# Patient Record
Sex: Female | Born: 1935 | Race: White | Hispanic: No | Marital: Married | Smoking: Former smoker
Health system: Southern US, Community
[De-identification: ages and names within clinical notes are randomized; demographics above are authoritative.]

## PROBLEM LIST (undated history)

## (undated) DIAGNOSIS — J40 Bronchitis, not specified as acute or chronic: Secondary | ICD-10-CM

## (undated) DIAGNOSIS — C50919 Malignant neoplasm of unspecified site of unspecified female breast: Secondary | ICD-10-CM

## (undated) DIAGNOSIS — K219 Gastro-esophageal reflux disease without esophagitis: Secondary | ICD-10-CM

## (undated) DIAGNOSIS — G2581 Restless legs syndrome: Secondary | ICD-10-CM

## (undated) DIAGNOSIS — K5720 Diverticulitis of large intestine with perforation and abscess without bleeding: Secondary | ICD-10-CM

## (undated) DIAGNOSIS — Z951 Presence of aortocoronary bypass graft: Secondary | ICD-10-CM

## (undated) DIAGNOSIS — E119 Type 2 diabetes mellitus without complications: Secondary | ICD-10-CM

## (undated) DIAGNOSIS — I2510 Atherosclerotic heart disease of native coronary artery without angina pectoris: Secondary | ICD-10-CM

## (undated) DIAGNOSIS — N200 Calculus of kidney: Secondary | ICD-10-CM

## (undated) DIAGNOSIS — I10 Essential (primary) hypertension: Secondary | ICD-10-CM

## (undated) DIAGNOSIS — M1990 Unspecified osteoarthritis, unspecified site: Secondary | ICD-10-CM

## (undated) DIAGNOSIS — Z87442 Personal history of urinary calculi: Secondary | ICD-10-CM

## (undated) DIAGNOSIS — L409 Psoriasis, unspecified: Secondary | ICD-10-CM

## (undated) DIAGNOSIS — E785 Hyperlipidemia, unspecified: Secondary | ICD-10-CM

## (undated) DIAGNOSIS — Z923 Personal history of irradiation: Secondary | ICD-10-CM

## (undated) DIAGNOSIS — E039 Hypothyroidism, unspecified: Secondary | ICD-10-CM

## (undated) DIAGNOSIS — C50912 Malignant neoplasm of unspecified site of left female breast: Secondary | ICD-10-CM

## (undated) DIAGNOSIS — I6529 Occlusion and stenosis of unspecified carotid artery: Secondary | ICD-10-CM

## (undated) HISTORY — DX: Diverticulitis of large intestine with perforation and abscess without bleeding: K57.20

## (undated) HISTORY — DX: Calculus of kidney: N20.0

## (undated) HISTORY — PX: COLON SURGERY: SHX602

## (undated) HISTORY — DX: Atherosclerotic heart disease of native coronary artery without angina pectoris: I25.10

## (undated) HISTORY — DX: Occlusion and stenosis of unspecified carotid artery: I65.29

## (undated) HISTORY — PX: APPENDECTOMY: SHX54

## (undated) HISTORY — PX: OTHER SURGICAL HISTORY: SHX169

## (undated) HISTORY — DX: Restless legs syndrome: G25.81

## (undated) HISTORY — PX: ABDOMINAL HYSTERECTOMY: SHX81

## (undated) HISTORY — PX: COLONOSCOPY: SHX174

## (undated) HISTORY — DX: Presence of aortocoronary bypass graft: Z95.1

## (undated) HISTORY — DX: Hyperlipidemia, unspecified: E78.5

## (undated) HISTORY — DX: Essential (primary) hypertension: I10

## (undated) HISTORY — DX: Type 2 diabetes mellitus without complications: E11.9

## (undated) HISTORY — DX: Hypothyroidism, unspecified: E03.9

## (undated) HISTORY — DX: Malignant neoplasm of unspecified site of left female breast: C50.912

---

## 1975-09-06 HISTORY — PX: COLON RESECTION: SHX5231

## 1997-09-05 HISTORY — PX: VEIN BYPASS SURGERY: SHX833

## 2000-12-26 ENCOUNTER — Other Ambulatory Visit: Admission: RE | Admit: 2000-12-26 | Discharge: 2000-12-26 | Payer: Self-pay | Admitting: Obstetrics & Gynecology

## 2003-10-01 ENCOUNTER — Other Ambulatory Visit: Admission: RE | Admit: 2003-10-01 | Discharge: 2003-10-01 | Payer: Self-pay | Admitting: Obstetrics & Gynecology

## 2005-12-29 ENCOUNTER — Ambulatory Visit: Payer: Self-pay | Admitting: Cardiology

## 2007-04-23 ENCOUNTER — Ambulatory Visit: Payer: Self-pay | Admitting: Cardiology

## 2007-06-07 ENCOUNTER — Ambulatory Visit: Payer: Self-pay | Admitting: Internal Medicine

## 2007-06-21 ENCOUNTER — Ambulatory Visit: Payer: Self-pay | Admitting: Internal Medicine

## 2007-08-31 ENCOUNTER — Encounter: Admission: RE | Admit: 2007-08-31 | Discharge: 2007-08-31 | Payer: Self-pay | Admitting: Endocrinology

## 2007-09-11 ENCOUNTER — Encounter: Admission: RE | Admit: 2007-09-11 | Discharge: 2007-09-11 | Payer: Self-pay | Admitting: Endocrinology

## 2008-09-12 ENCOUNTER — Encounter: Admission: RE | Admit: 2008-09-12 | Discharge: 2008-09-12 | Payer: Self-pay | Admitting: Endocrinology

## 2009-04-07 ENCOUNTER — Encounter: Payer: Self-pay | Admitting: Cardiology

## 2009-06-04 ENCOUNTER — Ambulatory Visit: Payer: Self-pay | Admitting: Cardiology

## 2009-06-04 DIAGNOSIS — I10 Essential (primary) hypertension: Secondary | ICD-10-CM | POA: Insufficient documentation

## 2009-06-04 DIAGNOSIS — I447 Left bundle-branch block, unspecified: Secondary | ICD-10-CM

## 2009-06-19 ENCOUNTER — Ambulatory Visit: Payer: Self-pay | Admitting: Cardiology

## 2009-06-19 ENCOUNTER — Ambulatory Visit (HOSPITAL_COMMUNITY): Admission: RE | Admit: 2009-06-19 | Discharge: 2009-06-19 | Payer: Self-pay | Admitting: Cardiology

## 2009-06-19 ENCOUNTER — Ambulatory Visit: Payer: Self-pay

## 2009-06-19 ENCOUNTER — Encounter: Payer: Self-pay | Admitting: Cardiology

## 2009-06-26 ENCOUNTER — Telehealth: Payer: Self-pay | Admitting: Cardiology

## 2009-09-20 ENCOUNTER — Emergency Department (HOSPITAL_COMMUNITY): Admission: EM | Admit: 2009-09-20 | Discharge: 2009-09-20 | Payer: Self-pay | Admitting: Emergency Medicine

## 2009-10-01 ENCOUNTER — Encounter: Admission: RE | Admit: 2009-10-01 | Discharge: 2009-10-01 | Payer: Self-pay | Admitting: Endocrinology

## 2009-12-11 ENCOUNTER — Emergency Department (HOSPITAL_COMMUNITY): Admission: EM | Admit: 2009-12-11 | Discharge: 2009-12-12 | Payer: Self-pay | Admitting: Emergency Medicine

## 2010-05-17 ENCOUNTER — Encounter: Admission: RE | Admit: 2010-05-17 | Discharge: 2010-05-17 | Payer: Self-pay | Admitting: Orthopedic Surgery

## 2010-05-19 ENCOUNTER — Ambulatory Visit (HOSPITAL_BASED_OUTPATIENT_CLINIC_OR_DEPARTMENT_OTHER): Admission: RE | Admit: 2010-05-19 | Discharge: 2010-05-19 | Payer: Self-pay | Admitting: Orthopedic Surgery

## 2010-09-05 HISTORY — PX: CARDIAC CATHETERIZATION: SHX172

## 2010-10-04 ENCOUNTER — Encounter
Admission: RE | Admit: 2010-10-04 | Discharge: 2010-10-04 | Payer: Self-pay | Source: Home / Self Care | Attending: Endocrinology | Admitting: Endocrinology

## 2010-10-06 ENCOUNTER — Other Ambulatory Visit: Payer: Self-pay | Admitting: Endocrinology

## 2010-10-06 DIAGNOSIS — R921 Mammographic calcification found on diagnostic imaging of breast: Secondary | ICD-10-CM

## 2010-10-08 ENCOUNTER — Ambulatory Visit
Admission: RE | Admit: 2010-10-08 | Discharge: 2010-10-08 | Disposition: A | Discharge status: Home / Self Care | Payer: Medicare Other | Source: Ambulatory Visit | Attending: Endocrinology | Admitting: Endocrinology

## 2010-10-08 DIAGNOSIS — R921 Mammographic calcification found on diagnostic imaging of breast: Secondary | ICD-10-CM

## 2010-10-25 ENCOUNTER — Telehealth (INDEPENDENT_AMBULATORY_CARE_PROVIDER_SITE_OTHER): Payer: Self-pay | Admitting: *Deleted

## 2010-11-02 NOTE — Progress Notes (Signed)
  Request received from Exam One sent to [facility]  October 25, 2010 11:59 AM

## 2010-11-18 LAB — POCT I-STAT 4, (NA,K, GLUC, HGB,HCT)
Glucose, Bld: 106 mg/dL — ABNORMAL HIGH (ref 70–99)
Hemoglobin: 14.3 g/dL (ref 12.0–15.0)
Potassium: 3.4 mEq/L — ABNORMAL LOW (ref 3.5–5.1)
Sodium: 141 mEq/L (ref 135–145)

## 2010-11-24 LAB — COMPREHENSIVE METABOLIC PANEL
ALT: 52 U/L — ABNORMAL HIGH (ref 0–35)
Alkaline Phosphatase: 96 U/L (ref 39–117)
CO2: 27 mEq/L (ref 19–32)
GFR calc Af Amer: >60 mL/min (ref >60)
Glucose, Bld: 100 mg/dL — ABNORMAL HIGH (ref 70–99)
Total Protein: 7.4 g/dL (ref 6.0–8.3)

## 2010-11-24 LAB — DIFFERENTIAL
Basophils Absolute: 0 10*3/uL (ref 0.0–0.1)
Eosinophils Relative: 1 % (ref 0–5)
Lymphocytes Relative: 30 % (ref 12–46)
Monocytes Absolute: 0.6 10*3/uL (ref 0.1–1.0)
Monocytes Relative: 7 % (ref 3–12)

## 2010-11-24 LAB — CBC
HCT: 40.3 % (ref 36.0–46.0)
Hemoglobin: 13.3 g/dL (ref 12.0–15.0)
MCHC: 33 g/dL (ref 30.0–36.0)
MCV: 89.3 fL (ref 78.0–100.0)
RDW: 14.5 % (ref 11.5–15.5)

## 2010-11-24 LAB — LIPASE, BLOOD: Lipase: 21 U/L (ref 11–59)

## 2011-01-18 NOTE — Letter (Signed)
April 23, 2007    Veverly Fells. Altheimer, M.D.  [address]  [place], [place] [address]   RE:  Katherine, [name]  MRN:  [id number]  /  DOB:  04/23/1933   Dear [name]:   I had the pleasure of seeing [name] in the office today in  followup.  Cardiacwise she is doing fine.  She has not had any  significant symptoms.  Her biggest job probably is taking care of her  husband.  As you know, she previously had left bundle branch block.  She  has been on drug therapy for hypertension, and done reasonably well with  that.  Her last echocardiogram done in 2004 was consistent with a  conduction abnormality and perhaps mild diastolic abnormality from  relaxation, largely perhaps related to her hypertension.   CURRENT MEDICATIONS:  1. Synthroid 0.125 mg daily.  2. Hyzaar 50/12.5 daily.  3. Nexium 40 mg daily.  4. Multivitamin daily.  5. ReQuip 1 mg daily.  6. Lipitor 40 mg daily.  7. Zetia 10 mg daily.  8. Premarin 0.3 mg daily.  9. Aspirin 81 mg daily.   PHYSICAL:  She is well-appearing.  The blood pressure is 118/70, the  pulse is 74, the weight is 179 pounds.  There is some paradoxical  splitting of the second heart sound, no murmurs at the present are  noted, there is no definite extremity edema.   The electrocardiogram demonstrates normal sinus rhythm and left bundle  branch block.   To summarize, this patient has an intraventricular conduction delay,  probably mild diastolic dysfunction in part related to hypertension, and  hypertension which seems to be under better control.  I did go through  the whole issue with regard to the Vytorin and Zetia issue and recounted  that to she and her husband in detail today.   We plan to see her back in followup in about 2 years.  At that time, it  probably would be worthwhile to recheck her echocardiogram to reassess  her left ventricular function.  Otherwise, she will continue to do well  and I have reinforced the need for  them to follow a diet.  I appreciate  the opportunity of sharing in her care.    Sincerely,      Arturo Morton. Riley Kill, MD, [facility]  Electronically Signed    TDS/MedQ  DD: 04/23/2007  DT: 04/24/2007  Job #: [id number]

## 2011-03-18 ENCOUNTER — Other Ambulatory Visit: Payer: Self-pay | Admitting: Endocrinology

## 2011-03-18 DIAGNOSIS — R921 Mammographic calcification found on diagnostic imaging of breast: Secondary | ICD-10-CM

## 2011-03-25 ENCOUNTER — Ambulatory Visit
Admission: RE | Admit: 2011-03-25 | Discharge: 2011-03-25 | Disposition: A | Discharge status: Home / Self Care | Payer: Medicare Other | Source: Ambulatory Visit | Attending: Endocrinology | Admitting: Endocrinology

## 2011-03-25 DIAGNOSIS — R921 Mammographic calcification found on diagnostic imaging of breast: Secondary | ICD-10-CM

## 2011-06-06 DIAGNOSIS — Z951 Presence of aortocoronary bypass graft: Secondary | ICD-10-CM

## 2011-06-06 HISTORY — DX: Presence of aortocoronary bypass graft: Z95.1

## 2011-06-06 HISTORY — PX: CORONARY ARTERY BYPASS GRAFT: SHX141

## 2011-06-12 ENCOUNTER — Emergency Department (HOSPITAL_COMMUNITY): Payer: Medicare Other

## 2011-06-12 ENCOUNTER — Inpatient Hospital Stay (HOSPITAL_COMMUNITY)
Admission: EM | Admit: 2011-06-12 | Discharge: 2011-06-21 | DRG: 234 | Disposition: A | Discharge status: Home / Self Care | Payer: Medicare Other | Attending: Cardiothoracic Surgery | Admitting: Cardiothoracic Surgery

## 2011-06-12 DIAGNOSIS — E876 Hypokalemia: Secondary | ICD-10-CM | POA: Diagnosis present

## 2011-06-12 DIAGNOSIS — E039 Hypothyroidism, unspecified: Secondary | ICD-10-CM | POA: Diagnosis present

## 2011-06-12 DIAGNOSIS — D62 Acute posthemorrhagic anemia: Secondary | ICD-10-CM | POA: Diagnosis not present

## 2011-06-12 DIAGNOSIS — I447 Left bundle-branch block, unspecified: Secondary | ICD-10-CM | POA: Diagnosis present

## 2011-06-12 DIAGNOSIS — I498 Other specified cardiac arrhythmias: Secondary | ICD-10-CM | POA: Diagnosis present

## 2011-06-12 DIAGNOSIS — Z7982 Long term (current) use of aspirin: Secondary | ICD-10-CM

## 2011-06-12 DIAGNOSIS — E119 Type 2 diabetes mellitus without complications: Secondary | ICD-10-CM | POA: Diagnosis present

## 2011-06-12 DIAGNOSIS — R0789 Other chest pain: Secondary | ICD-10-CM

## 2011-06-12 DIAGNOSIS — E785 Hyperlipidemia, unspecified: Secondary | ICD-10-CM | POA: Diagnosis present

## 2011-06-12 DIAGNOSIS — I119 Hypertensive heart disease without heart failure: Secondary | ICD-10-CM | POA: Diagnosis present

## 2011-06-12 DIAGNOSIS — Z87891 Personal history of nicotine dependence: Secondary | ICD-10-CM

## 2011-06-12 DIAGNOSIS — I2510 Atherosclerotic heart disease of native coronary artery without angina pectoris: Principal | ICD-10-CM | POA: Diagnosis present

## 2011-06-12 DIAGNOSIS — G2581 Restless legs syndrome: Secondary | ICD-10-CM | POA: Diagnosis present

## 2011-06-12 DIAGNOSIS — E669 Obesity, unspecified: Secondary | ICD-10-CM | POA: Diagnosis present

## 2011-06-12 DIAGNOSIS — I200 Unstable angina: Secondary | ICD-10-CM | POA: Diagnosis present

## 2011-06-12 LAB — DIFFERENTIAL
Basophils Absolute: 0 10*3/uL (ref 0.0–0.1)
Lymphocytes Relative: 39 % (ref 12–46)
Lymphs Abs: 3.5 10*3/uL (ref 0.7–4.0)
Monocytes Absolute: 0.7 10*3/uL (ref 0.1–1.0)
Monocytes Relative: 7 % (ref 3–12)
Neutro Abs: 4.6 10*3/uL (ref 1.7–7.7)

## 2011-06-12 LAB — COMPREHENSIVE METABOLIC PANEL
BUN: 19 mg/dL (ref 6–23)
CO2: 25 mEq/L (ref 19–32)
Calcium: 9.8 mg/dL (ref 8.4–10.5)
Creatinine, Ser: 0.65 mg/dL (ref 0.50–1.10)
GFR calc Af Amer: >90 mL/min (ref >90)
GFR calc non Af Amer: 84 mL/min — ABNORMAL LOW (ref >90)
Glucose, Bld: 140 mg/dL — ABNORMAL HIGH (ref 70–99)
Total Protein: 7.7 g/dL (ref 6.0–8.3)

## 2011-06-12 LAB — CK TOTAL AND CKMB (NOT AT ARMC)
CK, MB: 3.3 ng/mL (ref 0.3–4.0)
Total CK: 124 U/L (ref 7–177)

## 2011-06-12 LAB — POCT I-STAT TROPONIN I: Troponin i, poc: 0 ng/mL (ref 0.00–0.08)

## 2011-06-12 LAB — CBC
MCH: 29.4 pg (ref 26.0–34.0)
MCHC: 34 g/dL (ref 30.0–36.0)
MCV: 86.6 fL (ref 78.0–100.0)
Platelets: 266 10*3/uL (ref 150–400)
RDW: 14.1 % (ref 11.5–15.5)

## 2011-06-13 DIAGNOSIS — I2510 Atherosclerotic heart disease of native coronary artery without angina pectoris: Secondary | ICD-10-CM

## 2011-06-13 LAB — BASIC METABOLIC PANEL
BUN: 15 mg/dL (ref 6–23)
Creatinine, Ser: 0.53 mg/dL (ref 0.50–1.10)
GFR calc Af Amer: >90 mL/min (ref >90)
GFR calc non Af Amer: 90 mL/min — ABNORMAL LOW (ref >90)
Glucose, Bld: 103 mg/dL — ABNORMAL HIGH (ref 70–99)

## 2011-06-13 LAB — GLUCOSE, CAPILLARY
Glucose-Capillary: 118 mg/dL — ABNORMAL HIGH (ref 70–99)
Glucose-Capillary: 110 mg/dL — ABNORMAL HIGH (ref 70–99)
Glucose-Capillary: 143 mg/dL — ABNORMAL HIGH (ref 70–99)

## 2011-06-13 LAB — CARDIAC PANEL(CRET KIN+CKTOT+MB+TROPI)
Relative Index: 2.3 (ref 0.0–2.5)
Relative Index: 2.3 (ref 0.0–2.5)
Relative Index: 2 (ref 0.0–2.5)
Total CK: 121 U/L (ref 7–177)
Total CK: 164 U/L (ref 7–177)
Troponin I: <0.3 ng/mL (ref <0.30)
Troponin I: <0.3 ng/mL (ref <0.30)

## 2011-06-13 LAB — TSH: TSH: 1.966 u[IU]/mL (ref 0.350–4.500)

## 2011-06-13 LAB — D-DIMER, QUANTITATIVE
D-Dimer, Quant: 0.23 ug/mL-FEU (ref 0.00–0.48)
D-Dimer, Quant: 0.28 ug/mL-FEU (ref 0.00–0.48)

## 2011-06-13 LAB — HEMOGLOBIN A1C: Mean Plasma Glucose: 134 mg/dL — ABNORMAL HIGH (ref <117)

## 2011-06-14 ENCOUNTER — Inpatient Hospital Stay (HOSPITAL_COMMUNITY): Payer: Medicare Other

## 2011-06-14 DIAGNOSIS — I379 Nonrheumatic pulmonary valve disorder, unspecified: Secondary | ICD-10-CM

## 2011-06-14 DIAGNOSIS — Z01810 Encounter for preprocedural cardiovascular examination: Secondary | ICD-10-CM

## 2011-06-14 DIAGNOSIS — I2510 Atherosclerotic heart disease of native coronary artery without angina pectoris: Secondary | ICD-10-CM

## 2011-06-14 LAB — TYPE AND SCREEN
ABO/RH(D): A POS
Antibody Screen: NEGATIVE

## 2011-06-14 LAB — BASIC METABOLIC PANEL
BUN: 14 mg/dL (ref 6–23)
Calcium: 9.2 mg/dL (ref 8.4–10.5)
GFR calc Af Amer: >90 mL/min (ref >90)
GFR calc non Af Amer: >90 mL/min (ref >90)
Glucose, Bld: 100 mg/dL — ABNORMAL HIGH (ref 70–99)
Potassium: 3.4 mEq/L — ABNORMAL LOW (ref 3.5–5.1)

## 2011-06-15 ENCOUNTER — Inpatient Hospital Stay (HOSPITAL_COMMUNITY): Payer: Medicare Other

## 2011-06-15 DIAGNOSIS — I2510 Atherosclerotic heart disease of native coronary artery without angina pectoris: Secondary | ICD-10-CM

## 2011-06-15 LAB — CBC
HCT: 39.7 % (ref 36.0–46.0)
HCT: 30.7 % — ABNORMAL LOW (ref 36.0–46.0)
HCT: 22.5 % — ABNORMAL LOW (ref 36.0–46.0)
HCT: 33.2 % — ABNORMAL LOW (ref 36.0–46.0)
Hemoglobin: 13.3 g/dL (ref 12.0–15.0)
Hemoglobin: 10.3 g/dL — ABNORMAL LOW (ref 12.0–15.0)
Hemoglobin: 7.6 g/dL — ABNORMAL LOW (ref 12.0–15.0)
Hemoglobin: 11.1 g/dL — ABNORMAL LOW (ref 12.0–15.0)
MCH: 28.6 pg (ref 26.0–34.0)
MCH: 28.9 pg (ref 26.0–34.0)
MCH: 28.6 pg (ref 26.0–34.0)
MCHC: 33.6 g/dL (ref 30.0–36.0)
MCHC: 33.8 g/dL (ref 30.0–36.0)
MCHC: 33.4 g/dL (ref 30.0–36.0)
MCV: 85.3 fL (ref 78.0–100.0)
MCV: 85.6 fL (ref 78.0–100.0)
MCV: 85.6 fL (ref 78.0–100.0)
Platelets: 169 10*3/uL (ref 150–400)
Platelets: 136 10*3/uL — ABNORMAL LOW (ref 150–400)
Platelets: 207 10*3/uL (ref 150–400)
RBC: 4.61 MIL/uL (ref 3.87–5.11)
RBC: 3.6 MIL/uL — ABNORMAL LOW (ref 3.87–5.11)
RBC: 2.63 MIL/uL — ABNORMAL LOW (ref 3.87–5.11)
RBC: 3.88 MIL/uL (ref 3.87–5.11)
RDW: 13.9 % (ref 11.5–15.5)
RDW: 13.7 % (ref 11.5–15.5)
RDW: 13.9 % (ref 11.5–15.5)
WBC: 9.5 10*3/uL (ref 4.0–10.5)
WBC: 10.2 10*3/uL (ref 4.0–10.5)
WBC: 7.6 10*3/uL (ref 4.0–10.5)
WBC: 11.1 10*3/uL — ABNORMAL HIGH (ref 4.0–10.5)

## 2011-06-15 LAB — GLUCOSE, CAPILLARY

## 2011-06-15 LAB — POCT I-STAT 4, (NA,K, GLUC, HGB,HCT)
Glucose, Bld: 104 mg/dL — ABNORMAL HIGH (ref 70–99)
Glucose, Bld: 122 mg/dL — ABNORMAL HIGH (ref 70–99)
Glucose, Bld: 116 mg/dL — ABNORMAL HIGH (ref 70–99)
Glucose, Bld: 118 mg/dL — ABNORMAL HIGH (ref 70–99)
Glucose, Bld: 104 mg/dL — ABNORMAL HIGH (ref 70–99)
HCT: 32 % — ABNORMAL LOW (ref 36.0–46.0)
HCT: 31 % — ABNORMAL LOW (ref 36.0–46.0)
HCT: 31 % — ABNORMAL LOW (ref 36.0–46.0)
HCT: 31 % — ABNORMAL LOW (ref 36.0–46.0)
HCT: 34 % — ABNORMAL LOW (ref 36.0–46.0)
Hemoglobin: 10.9 g/dL — ABNORMAL LOW (ref 12.0–15.0)
Hemoglobin: 10.5 g/dL — ABNORMAL LOW (ref 12.0–15.0)
Hemoglobin: 10.5 g/dL — ABNORMAL LOW (ref 12.0–15.0)
Hemoglobin: 10.5 g/dL — ABNORMAL LOW (ref 12.0–15.0)
Hemoglobin: 11.6 g/dL — ABNORMAL LOW (ref 12.0–15.0)
Potassium: 3.9 meq/L (ref 3.5–5.1)
Potassium: 3.8 meq/L (ref 3.5–5.1)
Potassium: 3.5 meq/L (ref 3.5–5.1)
Potassium: 3.6 meq/L (ref 3.5–5.1)
Potassium: 3.5 mEq/L (ref 3.5–5.1)
Sodium: 139 meq/L (ref 135–145)
Sodium: 139 meq/L (ref 135–145)
Sodium: 139 meq/L (ref 135–145)
Sodium: 140 meq/L (ref 135–145)
Sodium: 139 mEq/L (ref 135–145)

## 2011-06-15 LAB — BLOOD GAS, ARTERIAL
Acid-Base Excess: 0 mmol/L (ref 0.0–2.0)
Bicarbonate: 23.9 meq/L (ref 20.0–24.0)
Drawn by: 3
O2 Content: 0.2 L/min
O2 Saturation: 96.6 %
Patient temperature: 98.6
Sample type: 347621
TCO2: 25.1 mmol/L (ref 0–100)
pCO2 arterial: 37.6 mmHg (ref 35.0–45.0)
pH, Arterial: 7.42 — ABNORMAL HIGH (ref 7.350–7.400)
pO2, Arterial: 79.7 mmHg — ABNORMAL LOW (ref 80.0–100.0)

## 2011-06-15 LAB — POCT I-STAT, CHEM 8
BUN: 8 mg/dL (ref 6–23)
Calcium, Ion: 1.23 mmol/L (ref 1.12–1.32)
Creatinine, Ser: 0.2 mg/dL — ABNORMAL LOW (ref 0.50–1.10)
Creatinine, Ser: 0.6 mg/dL (ref 0.50–1.10)
Glucose, Bld: 73 mg/dL (ref 70–99)
Glucose, Bld: 104 mg/dL — ABNORMAL HIGH (ref 70–99)
Hemoglobin: 11.2 g/dL — ABNORMAL LOW (ref 12.0–15.0)
Potassium: 2.5 mEq/L — CL (ref 3.5–5.1)
Sodium: 146 mEq/L — ABNORMAL HIGH (ref 135–145)
Sodium: 138 mEq/L (ref 135–145)
TCO2: 13 mmol/L (ref 0–100)
TCO2: 22 mmol/L (ref 0–100)

## 2011-06-15 LAB — CREATININE, SERUM
Creatinine, Ser: <0.47 mg/dL — ABNORMAL LOW (ref 0.50–1.10)
Creatinine, Ser: 0.51 mg/dL (ref 0.50–1.10)
GFR calc Af Amer: >90 mL/min (ref >90)
GFR calc non Af Amer: >90 mL/min (ref >90)

## 2011-06-15 LAB — POCT I-STAT 3, ART BLOOD GAS (G3+)
Acid-base deficit: 7 mmol/L — ABNORMAL HIGH (ref 0.0–2.0)
Bicarbonate: 23.3 mEq/L (ref 20.0–24.0)
Bicarbonate: 18.3 mEq/L — ABNORMAL LOW (ref 20.0–24.0)
O2 Saturation: 97 %
Patient temperature: 37.8
TCO2: 25 mmol/L (ref 0–100)
pCO2 arterial: 38.2 mmHg (ref 35.0–45.0)
pH, Arterial: 7.382 (ref 7.350–7.400)
pH, Arterial: 7.338 — ABNORMAL LOW (ref 7.350–7.400)
pO2, Arterial: 80 mmHg (ref 80.0–100.0)

## 2011-06-15 LAB — POTASSIUM
Potassium: 2.8 mEq/L — ABNORMAL LOW (ref 3.5–5.1)
Potassium: 4.3 mEq/L (ref 3.5–5.1)

## 2011-06-15 LAB — SURGICAL PCR SCREEN
MRSA, PCR: NEGATIVE
Staphylococcus aureus: POSITIVE — AB

## 2011-06-15 LAB — BASIC METABOLIC PANEL WITH GFR
BUN: 19 mg/dL (ref 6–23)
CO2: 23 meq/L (ref 19–32)
Calcium: 9.9 mg/dL (ref 8.4–10.5)
Chloride: 105 meq/L (ref 96–112)
Creatinine, Ser: 0.63 mg/dL (ref 0.50–1.10)
GFR calc Af Amer: >90 mL/min
GFR calc non Af Amer: 85 mL/min — ABNORMAL LOW
Glucose, Bld: 113 mg/dL — ABNORMAL HIGH (ref 70–99)
Potassium: 3.9 meq/L (ref 3.5–5.1)
Sodium: 140 meq/L (ref 135–145)

## 2011-06-15 LAB — APTT: aPTT: 40 seconds — ABNORMAL HIGH (ref 24–37)

## 2011-06-15 LAB — POCT I-STAT GLUCOSE
Glucose, Bld: 104 mg/dL — ABNORMAL HIGH (ref 70–99)
Operator id: 195151

## 2011-06-15 LAB — URINE MICROSCOPIC-ADD ON

## 2011-06-15 LAB — URINALYSIS, ROUTINE W REFLEX MICROSCOPIC
Glucose, UA: NEGATIVE mg/dL
Nitrite: NEGATIVE
pH: 5 (ref 5.0–8.0)

## 2011-06-15 LAB — MAGNESIUM
Magnesium: 1.9 mg/dL (ref 1.5–2.5)
Magnesium: 2.5 mg/dL (ref 1.5–2.5)

## 2011-06-15 LAB — PROTIME-INR
INR: 1.52 — ABNORMAL HIGH (ref 0.00–1.49)
Prothrombin Time: 18.6 seconds — ABNORMAL HIGH (ref 11.6–15.2)

## 2011-06-16 ENCOUNTER — Inpatient Hospital Stay (HOSPITAL_COMMUNITY): Payer: Medicare Other

## 2011-06-16 DIAGNOSIS — E1165 Type 2 diabetes mellitus with hyperglycemia: Secondary | ICD-10-CM

## 2011-06-16 DIAGNOSIS — IMO0001 Reserved for inherently not codable concepts without codable children: Secondary | ICD-10-CM

## 2011-06-16 HISTORY — PX: OTHER SURGICAL HISTORY: SHX169

## 2011-06-16 LAB — CREATININE, SERUM
Creatinine, Ser: 0.56 mg/dL (ref 0.50–1.10)
GFR calc Af Amer: >90 mL/min (ref >90)
GFR calc non Af Amer: 88 mL/min — ABNORMAL LOW (ref >90)

## 2011-06-16 LAB — BASIC METABOLIC PANEL
BUN: 14 mg/dL (ref 6–23)
CO2: 21 mEq/L (ref 19–32)
Calcium: 8.6 mg/dL (ref 8.4–10.5)
Chloride: 104 mEq/L (ref 96–112)
Creatinine, Ser: 0.57 mg/dL (ref 0.50–1.10)
GFR calc Af Amer: >90 mL/min (ref >90)
GFR calc non Af Amer: 88 mL/min — ABNORMAL LOW (ref >90)
Glucose, Bld: 119 mg/dL — ABNORMAL HIGH (ref 70–99)
Potassium: 4.1 mEq/L (ref 3.5–5.1)
Sodium: 134 mEq/L — ABNORMAL LOW (ref 135–145)

## 2011-06-16 LAB — MAGNESIUM
Magnesium: 2.1 mg/dL (ref 1.5–2.5)
Magnesium: 2 mg/dL (ref 1.5–2.5)

## 2011-06-16 LAB — CBC
HCT: 32.5 % — ABNORMAL LOW (ref 36.0–46.0)
HCT: 32.2 % — ABNORMAL LOW (ref 36.0–46.0)
Hemoglobin: 10.9 g/dL — ABNORMAL LOW (ref 12.0–15.0)
Hemoglobin: 10.7 g/dL — ABNORMAL LOW (ref 12.0–15.0)
MCH: 28.5 pg (ref 26.0–34.0)
MCH: 28.6 pg (ref 26.0–34.0)
MCHC: 33.5 g/dL (ref 30.0–36.0)
MCHC: 33.2 g/dL (ref 30.0–36.0)
MCV: 85.1 fL (ref 78.0–100.0)
MCV: 86.1 fL (ref 78.0–100.0)
Platelets: 219 10*3/uL (ref 150–400)
Platelets: 228 10*3/uL (ref 150–400)
RBC: 3.82 MIL/uL — ABNORMAL LOW (ref 3.87–5.11)
RBC: 3.74 MIL/uL — ABNORMAL LOW (ref 3.87–5.11)
RDW: 13.9 % (ref 11.5–15.5)
RDW: 14.3 % (ref 11.5–15.5)
WBC: 11.7 10*3/uL — ABNORMAL HIGH (ref 4.0–10.5)
WBC: 11.9 10*3/uL — ABNORMAL HIGH (ref 4.0–10.5)

## 2011-06-16 LAB — GLUCOSE, CAPILLARY
Glucose-Capillary: 104 mg/dL — ABNORMAL HIGH (ref 70–99)
Glucose-Capillary: 107 mg/dL — ABNORMAL HIGH (ref 70–99)
Glucose-Capillary: 108 mg/dL — ABNORMAL HIGH (ref 70–99)
Glucose-Capillary: 109 mg/dL — ABNORMAL HIGH (ref 70–99)
Glucose-Capillary: 111 mg/dL — ABNORMAL HIGH (ref 70–99)
Glucose-Capillary: 111 mg/dL — ABNORMAL HIGH (ref 70–99)
Glucose-Capillary: 146 mg/dL — ABNORMAL HIGH (ref 70–99)

## 2011-06-16 NOTE — Cardiovascular Report (Signed)
  [name], KEEL NO.:  [id number]  MEDICAL RECORD NO.:  [id number]  LOCATION:  4738                         FACILITY:  [facility]  PHYSICIAN:  Arturo Morton. Riley Kill, MD, FACCDATE OF BIRTH:  [birth year]  DATE OF PROCEDURE:  06/13/2011 DATE OF DISCHARGE:                           CARDIAC CATHETERIZATION   INDICATIONS:  This nice lady presented with chest pain.  She has multiple risk factors for coronary artery disease.  She has a chronic left bundle we have seen her previously.  She presented with chest pain. Her enzymes were negative.  Cardiac catheterization was recommended.  PROCEDURES: 1. Left heart catheterization. 2. Selective coronary arteriography. 3. Selective left ventriculography.  DESCRIPTION OF PROCEDURE:  The procedure was performed from the right femoral artery using 5-French catheters.  She tolerated well without complication.  She was given labetalol for hypertension.  There were no major complications and I have reviewed the findings with the patient and subsequently her family.  HEMODYNAMIC DATA: 1. Central aortic pressure 157/64, mean 101. 2. LV pressure 181/22. 3. No gradient or pullback across the aortic valve.  ANGIOGRAPHIC DATA: 1. On plain fluoroscopy, there was moderate calcification of all 3     coronary vessels. 2. The left main is free of critical disease. 3. The LAD has a bifurcation stenosis.  There is about 80% involvement     of the LAD and then 80% involvement of diagonal.  There is 80%     lesion in the LAD just beyond the diagonal.  The distal LAD is     suitable for grafting. 4. The circumflex provides a first marginal that has probably 80%     ostial and then a 70% mid lesion.  The remaining portion of the     circumflex is fairly large and is without critical narrowing other     than minor luminal irregularity. 5. The right coronary artery has clear-cut 70-80% ostial stenosis.     There is also a 60-70% mid vessel  stenosis.  The PDA and PLA are     suitable for grafting. 6. Ventriculography in the RAO projection reveals well-preserved left     ventricular function with a normal ejection fraction 55% by     estimate.  I did not appreciate significant regurgitation.  CONCLUSIONS:  Three-vessel coronary artery disease involving the ostium of the circumflex marginal, the bifurcation in midportion of the left anterior descending artery, and the ostium of the right coronary artery with mid right RCA disease.  DISPOSITION:  None of these are ideal for percutaneous intervention. She is asymptomatic.  She does have three-vessel disease.  I will ask the surgeons to see her with regard to consultation.     Arturo Morton. Riley Kill, MD, [facility]     TDS/MEDQ  D:  06/13/2011  T:  06/14/2011  Job:  [id number]  cc:   CV Laboratory Veverly Fells. Altheimer, M.D.  Electronically Signed by Shawnie Pons MD [facility] on 06/16/2011 05:47:40 AM

## 2011-06-17 ENCOUNTER — Inpatient Hospital Stay (HOSPITAL_COMMUNITY): Payer: Medicare Other

## 2011-06-17 LAB — BASIC METABOLIC PANEL
BUN: 11 mg/dL (ref 6–23)
CO2: 25 mEq/L (ref 19–32)
Calcium: 9.3 mg/dL (ref 8.4–10.5)
Chloride: 101 mEq/L (ref 96–112)
Creatinine, Ser: 0.52 mg/dL (ref 0.50–1.10)
GFR calc Af Amer: >90 mL/min (ref >90)
GFR calc non Af Amer: >90 mL/min (ref >90)
Glucose, Bld: 141 mg/dL — ABNORMAL HIGH (ref 70–99)
Potassium: 4.5 mEq/L (ref 3.5–5.1)
Sodium: 134 mEq/L — ABNORMAL LOW (ref 135–145)

## 2011-06-17 LAB — CBC
HCT: 35 % — ABNORMAL LOW (ref 36.0–46.0)
Hemoglobin: 11.6 g/dL — ABNORMAL LOW (ref 12.0–15.0)
MCH: 28.7 pg (ref 26.0–34.0)
MCHC: 33.1 g/dL (ref 30.0–36.0)
MCV: 86.6 fL (ref 78.0–100.0)
Platelets: 239 10*3/uL (ref 150–400)
RBC: 4.04 MIL/uL (ref 3.87–5.11)
RDW: 14.1 % (ref 11.5–15.5)
WBC: 13.8 10*3/uL — ABNORMAL HIGH (ref 4.0–10.5)

## 2011-06-17 LAB — POCT I-STAT, CHEM 8
BUN: 9 mg/dL (ref 6–23)
Chloride: 102 mEq/L (ref 96–112)
Creatinine, Ser: 0.7 mg/dL (ref 0.50–1.10)
Sodium: 135 mEq/L (ref 135–145)

## 2011-06-17 LAB — GLUCOSE, CAPILLARY
Glucose-Capillary: 143 mg/dL — ABNORMAL HIGH (ref 70–99)
Glucose-Capillary: 125 mg/dL — ABNORMAL HIGH (ref 70–99)

## 2011-06-18 ENCOUNTER — Inpatient Hospital Stay (HOSPITAL_COMMUNITY): Payer: Medicare Other

## 2011-06-18 LAB — BASIC METABOLIC PANEL
BUN: 17 mg/dL (ref 6–23)
CO2: 25 mEq/L (ref 19–32)
Calcium: 9.2 mg/dL (ref 8.4–10.5)
Chloride: 102 mEq/L (ref 96–112)
Creatinine, Ser: 0.47 mg/dL — ABNORMAL LOW (ref 0.50–1.10)
GFR calc Af Amer: >90 mL/min (ref >90)
GFR calc non Af Amer: >90 mL/min (ref >90)
Glucose, Bld: 134 mg/dL — ABNORMAL HIGH (ref 70–99)
Potassium: 4 mEq/L (ref 3.5–5.1)
Sodium: 137 mEq/L (ref 135–145)

## 2011-06-18 LAB — GLUCOSE, CAPILLARY
Glucose-Capillary: 149 mg/dL — ABNORMAL HIGH (ref 70–99)
Glucose-Capillary: 111 mg/dL — ABNORMAL HIGH (ref 70–99)
Glucose-Capillary: 109 mg/dL — ABNORMAL HIGH (ref 70–99)
Glucose-Capillary: 109 mg/dL — ABNORMAL HIGH (ref 70–99)
Glucose-Capillary: 124 mg/dL — ABNORMAL HIGH (ref 70–99)
Glucose-Capillary: 101 mg/dL — ABNORMAL HIGH (ref 70–99)

## 2011-06-18 LAB — CBC
HCT: 33.2 % — ABNORMAL LOW (ref 36.0–46.0)
Hemoglobin: 11.2 g/dL — ABNORMAL LOW (ref 12.0–15.0)
MCH: 29.2 pg (ref 26.0–34.0)
MCHC: 33.7 g/dL (ref 30.0–36.0)
MCV: 86.7 fL (ref 78.0–100.0)
Platelets: 251 10*3/uL (ref 150–400)
RBC: 3.83 MIL/uL — ABNORMAL LOW (ref 3.87–5.11)
RDW: 14.2 % (ref 11.5–15.5)
WBC: 11.4 10*3/uL — ABNORMAL HIGH (ref 4.0–10.5)

## 2011-06-19 LAB — BASIC METABOLIC PANEL
BUN: 17 mg/dL (ref 6–23)
CO2: 27 mEq/L (ref 19–32)
Calcium: 9.1 mg/dL (ref 8.4–10.5)
Chloride: 103 mEq/L (ref 96–112)
Creatinine, Ser: 0.48 mg/dL — ABNORMAL LOW (ref 0.50–1.10)
GFR calc Af Amer: >90 mL/min (ref >90)
GFR calc non Af Amer: >90 mL/min (ref >90)
Glucose, Bld: 100 mg/dL — ABNORMAL HIGH (ref 70–99)
Potassium: 3.6 mEq/L (ref 3.5–5.1)
Sodium: 138 mEq/L (ref 135–145)

## 2011-06-19 LAB — GLUCOSE, CAPILLARY: Glucose-Capillary: 101 mg/dL — ABNORMAL HIGH (ref 70–99)

## 2011-06-19 LAB — CLOSTRIDIUM DIFFICILE BY PCR: Toxigenic C. Difficile by PCR: NEGATIVE

## 2011-06-19 LAB — CBC
HCT: 31.8 % — ABNORMAL LOW (ref 36.0–46.0)
Hemoglobin: 10.5 g/dL — ABNORMAL LOW (ref 12.0–15.0)
MCH: 28.7 pg (ref 26.0–34.0)
MCHC: 33 g/dL (ref 30.0–36.0)
MCV: 86.9 fL (ref 78.0–100.0)
Platelets: 293 10*3/uL (ref 150–400)
RBC: 3.66 MIL/uL — ABNORMAL LOW (ref 3.87–5.11)
RDW: 14.2 % (ref 11.5–15.5)
WBC: 8.3 10*3/uL (ref 4.0–10.5)

## 2011-06-20 LAB — GLUCOSE, CAPILLARY: Glucose-Capillary: 98 mg/dL (ref 70–99)

## 2011-06-21 LAB — CBC
HCT: 31.4 % — ABNORMAL LOW (ref 36.0–46.0)
Hemoglobin: 10.5 g/dL — ABNORMAL LOW (ref 12.0–15.0)
MCH: 28.7 pg (ref 26.0–34.0)
MCHC: 33.4 g/dL (ref 30.0–36.0)
MCV: 85.8 fL (ref 78.0–100.0)
Platelets: 326 10*3/uL (ref 150–400)
RBC: 3.66 MIL/uL — ABNORMAL LOW (ref 3.87–5.11)
RDW: 14.1 % (ref 11.5–15.5)
WBC: 7.7 10*3/uL (ref 4.0–10.5)

## 2011-06-21 LAB — BASIC METABOLIC PANEL
BUN: 13 mg/dL (ref 6–23)
CO2: 25 mEq/L (ref 19–32)
Calcium: 9.2 mg/dL (ref 8.4–10.5)
Chloride: 106 mEq/L (ref 96–112)
Creatinine, Ser: 0.57 mg/dL (ref 0.50–1.10)
GFR calc Af Amer: >90 mL/min (ref >90)
GFR calc non Af Amer: 88 mL/min — ABNORMAL LOW (ref >90)
Glucose, Bld: 98 mg/dL (ref 70–99)
Potassium: 3.5 mEq/L (ref 3.5–5.1)
Sodium: 140 mEq/L (ref 135–145)

## 2011-06-21 LAB — MAGNESIUM: Magnesium: 1.7 mg/dL (ref 1.5–2.5)

## 2011-06-22 NOTE — Discharge Summary (Signed)
NAMEMAI, LONGNECKER NO.:  [id number]  MEDICAL RECORD NO.:  [id number]  LOCATION:  2018                         FACILITY:  [facility]  PHYSICIAN:  Sheliah Plane, MD    DATE OF BIRTH:  1933/06/23  DATE OF ADMISSION:  06/12/2011 DATE OF DISCHARGE:  06/21/2011                              DISCHARGE SUMMARY   PRIMARY ADMITTING DIAGNOSIS:  Chest pain.  ADDITIONAL/DISCHARGE DIAGNOSES: 1. Severe three-vessel coronary artery disease. 2. New onset angina. 3. Hypertension. 4. Hyperlipidemia. 5. Type 2 diabetes mellitus. 6. Remote history of tobacco abuse. 7. Diverticulitis with history of perforated colon 35 years ago status     post colon resection. 8. History of kidney stones. 9. Restless legs syndrome. 10.Left 40-59% internal carotid artery stenosis by duplex. 11.Hypothyroidism.  PROCEDURES PERFORMED: 1. Cardiac catheterization. 2. Coronary artery bypass grafting off pump x2 (left internal mammary     artery to the left anterior descending, saphenous vein graft to the     right coronary artery). 3. Endoscopic vein harvest, right thigh.  HISTORY:  The patient is a 77 year old female who presented to the emergency department on the date of this admission complaining of chest discomfort which began at rest while she was sitting in her kitchen. She had some previous shortness of breath with ambulation but none associated with a chest pain.  An EKG in the emergency department showed baseline left bundle-branch block, and her initial cardiac enzymes were normal.  She was subsequently seen by Cardiology and was admitted for further workup.  HOSPITAL COURSE:  The patient was admitted to the telemetry floor by Cardiology and it was felt that she should undergo cardiac catheterization.  This was performed by Dr. Riley Kill on June 13, 2011 and she was found to have severe three-vessel coronary artery disease including an 80% ostial right lesion with a large right  coronary artery, a sequential 80% proximal LAD lesion and disease in the small first obtuse marginal, and the small diagonal.  Because of her symptoms and current significant LAD disease, she was felt to be a good candidate for surgical revascularization.  Cardiac Surgery consult was requested and Dr. Sheliah Plane saw the patient and reviewed her films.  He agreed with the need for CABG.  He explained all risks, benefits, and alternatives of surgery to the patient and she agreed to proceed.  She remained stable in the hospital on heparin drip prior to surgery and had no further episodes of chest pain.  Her pre-CABG Doppler study showed ABIs greater than 1.0 and a left 40-59% ICA stenosis.  She was taken to the operating room on June 15, 2011 and underwent coronary artery bypass grafting x2 as described above.  The procedure was performed off pump secondary to concentric calcification of her ascending aorta. Please see previously dictated operative report for complete details of surgery.  She tolerated the procedure well and was transferred to the SICU in stable condition.  She was extubated shortly after surgery.  She was hemodynamically stable and doing well on postop day 1.  By postop day 2, all hemodynamic monitoring lines and chest tubes have been removed and she was able to be transferred to the floor.  Her major postoperative issue has been a GI upset.  She had significant nausea, vomiting, and diarrhea in her early postoperative course and was treated conservatively with minimization of narcotics and discontinuation of laxatives and stool softeners.  A C. dificile stool sample was sent but was negative.  Ultimately, her condition improved and by postop day 6, she was tolerating a regular diet and was having formed stools with no further nausea, vomiting, or diarrhea.  Also, she has had a few brief episodes of supraventricular tachycardia with frequent PVCs and PACs. She had  been started on a beta-blocker postoperatively and this was titrated upward.  Presently, she is maintaining normal sinus rhythm. Her overall postoperative course has gone well.  She is ambulating in the halls without difficulty and has been weaned from supplemental oxygen.  She has been afebrile and her vital signs have been stable. Her labs on postop day 6 show hemoglobin of 10.5, hematocrit 31.4, platelets 326, white count 7.7, sodium 140, potassium 3.5 which is being repleted, BUN 13, and creatinine 0.57.  Her blood sugars have remained stable on her home medication regimen.  Her incisions are all healing well.  She has been seen and evaluated on the morning of June 21, 2011 and is deemed ready for discharge home at this time.  DISCHARGE MEDICATIONS: 1. Enteric-coated aspirin 325 mg daily. 2. Lopressor 50 mg b.i.d. 3. Ultram 50-100 mg q.4 hours p.r.n. pain. 4. Lasix 40 mg daily x5 days. 5. Potassium 20 mEq daily x5 days. 6. Multivitamin daily. 7. Nexium 40 mg daily. 8. Actos 15 mg daily. 9. ReQuip 3 mg daily. 10.Synthroid 125 mcg daily. 11.Vitamin D 2000 mg daily. 12.Zetia 10 mg daily. 13.Zyrtec 10 mg daily p.r.n.  DISCHARGE INSTRUCTIONS:  She is asked to refrain from driving, heavy lifting, or strenuous activity.  She may continue ambulating daily and using her incentive spirometer.  She may shower daily and clean incisions with soap and water.  She will continue her same preoperative diet.  DISCHARGE FOLLOWUP:  She will need to make an appointment to see Dr. Riley Kill in 2 weeks.  She will then follow up in 3 weeks with Dr. Tyrone Sage or Dr. Dennie Maizes PA and our office will contact him with an appointment.  In the interim, if she experiences any problems or has questions, she is asked to contact our office.     Coral Ceo, P.A.   ______________________________ Sheliah Plane, MD    GC/MEDQ  D:  06/21/2011  T:  06/21/2011  Job:  [id number]  cc:   Veverly Fells.  Altheimer, M.D. [facility] Office  Electronically Signed by Coral Ceo P.A. on 06/22/2011 10:42:35 AM Electronically Signed by Sheliah Plane MD on 06/22/2011 06:16:36 PM

## 2011-06-22 NOTE — Op Note (Signed)
[name], [name]              ACCOUNT NO.:  [id number]  MEDICAL RECORD NO.:  [id number]  LOCATION:  2314                         FACILITY:  [facility]  PHYSICIAN:  Sheliah Plane, MD    DATE OF BIRTH:  1933-01-24  DATE OF PROCEDURE:  06/15/2011 DATE OF DISCHARGE:                              OPERATIVE REPORT   PREOPERATIVE DIAGNOSIS:  Coronary occlusive disease with new onset of angina at rest.  POSTOPERATIVE DIAGNOSIS:  Coronary occlusive disease with new onset of angina at rest.  SURGICAL PROCEDURE:  Off-pump coronary artery bypass grafting x2 with the left internal mammary to the left anterior descending coronary artery and reverse saphenous vein graft to the right coronary artery with right thigh endovein harvesting.  SURGEON:  Sheliah Plane, MD  FIRST ASSISTANT: Coral Ceo, PA.  BRIEF HISTORY:  The patient is a 77 year old female who presents to the emergency room with new onset of chest discomfort at rest.  Initial cardiac enzymes were negative.  Because of her symptoms and risk profile, cardiac catheterization was performed by Dr. Riley Kill.  This revealed an 80% ostial right lesion with a large right coronary artery.  A sequential 80% proximal LAD lesions in addition a small first obtuse marginal and small diagonal had disease.  The major branches of the circumflex were without disease.  Because of the patient's symptoms at rest with a proximal LAD disease and ostial right disease, coronary artery bypass grafting was recommended.  The risks and options of surgery including death, infection, stroke, myocardial infarction, bleeding, blood transfusion were all discussed with the patient and her family in detail and she was agreeable with proceeding.  DESCRIPTION OF PROCEDURE:  With Swan-Ganz and arterial line monitors in place, the patient underwent general endotracheal anesthesia without incident.  Skin of the chest and legs prepped with Betadine and draped in  usual sterile manner.  Using the Guidant endovein harvesting system, segment of vein was harvested from the right thigh, was of good quality and caliber.  Median sternotomy was performed.  Left internal mammary artery dissected down as a pedicle graft.  The distal artery was divided, had good free flow.  Pericardium was opened.  Overall ventricular function appeared preserved. Up on examination of the ascending aorta, it was apparent in the distal ascending aorta at the usual place for aortic cannulation, had circumferential calcification.  More proximal ascending aorta was without any calcification.  At this point, we decided to after examining the diagonal branch and first obtuse marginal, which were small to proceed with off-pump bypass with a vein to the right coronary artery and the left internal mammary to the left anterior descending.  The patient was systemically heparinized.  Attention was turned first to the LAD disease where Medtronic stabilization system was used, first with a suction apparatus at the apex of the heart.  This gave good exposure of the LAD in the mid to distal portion.  Vessel loops were placed proximally and distally around the vessel and the vessel was stabilized. During this, the patient remained hemodynamically stable.  The vessel was opened and was 1.4 mm in size.  Using a running 8-0 Prolene, left internal mammary artery was anastomosed to left  anterior descending coronary artery.  The vessel was de-aired and the anastomosis completed and blood flow restored down the LAD.  The fascia was tacked to the epicardium.  Attention was then turned to the right where a small partial occlusion clamp was placed at the very proximal aorta and soft noncalcified area without difficulty.  At this point, the aorta was of normal thickness and texture, a single punch aortotomy was performed and segment of reverse saphenous vein graft was anastomosed to the ascending aorta.   The stabilization device was then again used to slightly elevate the anterior surface of the heart and gave good exposure of the distal right coronary artery.  The vessel loops were placed to control bleeding and the vessel was opened.  Using a running 7-0 Prolene, distal anastomosis was performed.  The blood flow was restored down the vessel. The patient remained hemodynamically stable.  The graft marker was placed for the part of the proximal suture line.  There was good flow down both grafts by Doppler.  The ventricular pacing wire had been placed prior to the starting the anastomosis because the patient, although had adequate rate, did have a known left bundle-branch block. Pacing was not necessary.  Atrial pacing wires were applied.  Protamine was administered.  The pericardium was reapproximated.  A left pleural tube and a Blake mediastinal drain were left in place.  Sternum was closed with #6 stainless steel wire.  Fascia was closed with interrupted 0 Vicryl, running 3-0 Vicryl in subcutaneous tissue, and 4-0 subcuticular stitch and skin edges.  Dry dressings were applied.  Sponge and needle count was reported as correct at completion of the procedure. The patient tolerated the procedure without obvious complication and was transferred to the Surgical Intensive Care Unit for further postoperative care.     Sheliah Plane, MD     EG/MEDQ  D:  06/16/2011  T:  06/16/2011  Job:  [id number]  cc:   Arturo Morton. Riley Kill, MD, [facility] Veverly Fells. Altheimer, M.D.  Electronically Signed by Sheliah Plane MD on 06/22/2011 06:16:25 PM

## 2011-06-22 NOTE — Discharge Summary (Signed)
  NAMEKINDLE, STROHMEIER NO.:  [id number]  MEDICAL RECORD NO.:  [id number]  LOCATION:  2018                         FACILITY:  [facility]  PHYSICIAN:  Sheliah Plane, MD    DATE OF BIRTH:  March 30, 1933  DATE OF ADMISSION:  06/12/2011 DATE OF DISCHARGE:  06/21/2011                              DISCHARGE SUMMARY   ADDENDUM:  Ms. Bains is doing well and is ready for discharge home on June 21, 2011.  A correction to her discharge medication list is that her Lopressor has been increased to 75 mg b.i.d.  Also the patient has requested a prescription for Xanax to take p.r.n. for home use.  CORRECTED DISCHARGE MEDICATION LIST: 1. Xanax 0.25 mg q.6 hours p.r.n. anxiety. 2. Enteric-coated aspirin 325 mg daily. 3. Lasix 40 mg daily x5 days. 4. Metoprolol 75 mg b.i.d. 5. Potassium 20 mEq daily x5 days. 6. Ultram 50 mg 1-2 q.4 hours p.r.n. pain. 7. Lipitor 40 mg at bedtime. 8. Multivitamin daily. 9. Nexium 40 mg daily. 10.Actos 15 mg daily. 11.ReQuip 1 mg 3 tablets daily. 12.Synthroid 125 mcg daily. 13.Vitamin D 2000 mg daily. 14.Zetia 10 mg daily. 15.Zyrtec 10 mg daily.  All discharge instructions and follow ups and other details of her hospitalization unchanged from the previously dictated discharge summary.     Coral Ceo, P.A.   ______________________________ Sheliah Plane, MD   GC/MEDQ  D:  06/21/2011  T:  06/21/2011  Job:  [id number]  cc:   Veverly Fells. Altheimer, M.D.  Electronically Signed by Weldon Inches. on 06/22/2011 10:42:45 AM Electronically Signed by Sheliah Plane MD on 06/22/2011 06:16:40 PM

## 2011-06-22 NOTE — Consult Note (Signed)
[name], [name] NO.:  [id number]  MEDICAL RECORD NO.:  [id number]  LOCATION:  4738                         FACILITY:  [facility]  PHYSICIAN:  Katherine Plane, MD    DATE OF BIRTH:  08-19-1933  DATE OF CONSULTATION: DATE OF DISCHARGE:                                CONSULTATION   REQUESTING PHYSICIAN:  Katherine Morton. Riley Kill, MD, [facility]  FOLLOWUP CARDIOLOGIST:  Katherine Morton. Riley Kill, MD, [facility]  PRIMARY CARE PHYSICIAN:  Katherine Fells. Altheimer, MD  REASON FOR CONSULTATION:  New onset of angina.  HISTORY OF PRESENT ILLNESS:  The patient is a 77 year old female with no previous history of cardiac disease other than the development of a left bundle branch block.  Approximately 10 years ago.  She notes that on the day of admission, she had started developing at rest while sitting in her kitchen, substernal chest pressure and tightness.  Denied any sharp pain.  She had some shortness of breath while ambulating.  Because this was a new event for her and her concern, she came to the emergency room and was admitted.  Troponins were less than 0.3, CK 164, MB 3.3.  The patient has had no previous history of cardiac disease other than as noted the left bundle branch.  She has had no previous history of myocardial infarction, angioplasty or previous cardiac catheterization. Cardiac risk factors include hypertension, hyperlipidemia, three or four year history of type 2 diabetes.  She h a remote smoker quitting 35 years ago.  She denies any previous stroke.  Denies claudication. Denies renal insufficiency.  PAST MEDICAL HISTORY:  Significant for; 1. Kidney stones. 2. Toxemia pregnancy. 3. History of diverticulitis with perforated colon 35 years ago,     restless leg aches for which she takes ReQuip.  PAST SURGICAL HISTORY: 1. Colon resection 35 years ago. 2. Arthroscopy on the right knee. 3. Hysterectomy.  SOCIAL HISTORY:  The patient is married, lives with her husband, but  did bypass surgery in 1999.  FAMILY HISTORY:  The patient's mother died at the age 19, had a history of congestive heart failure.  Died of myocardial infarction following a surgical procedure for throat cancer.  Father died at age 36 of sudden death.  She has 1 brother who is age 70 with hypertension.  CURRENT MEDICATIONS: 1. Actos 15 mg a day. 2. Aspirin 81 mg a day. 3. Cozaar 50 mg daily. 4. Crestor 20 mg daily. 5. Hydrochlorothiazide 12.5 daily. 6. Lopressor 25 b.i.d. 7. She has been receiving sliding scale insulin while in the hospital. 8. ReQuip 3 mg at bedtime. 9. Synthroid 125 mcg a day. 10.Zetia 10 mg a day. 11.Ambien p.r.n.  DRUG ALLERGIES:  The patient notes ACTOS causes pedal edema.  REVIEW OF SYSTEMS:  Positive for chest pain and mild exertional shortness of breath.  She occasionally has lower extremity edema. Denies resting shortness of breath.  Denies palpitation, syncope, presyncope or orthopnea.  General review of systems.  She denies fever chills or night sweats.  Weight has been stable.  She denies hemoptysis. She has had no change in her bowel habits recently, but notes she is careful because she has had flare-up of diverticulitis in  the past. Denies amaurosis or TIAs.  Does have restless leg syndrome especially when she notes that she is tired.  She has had a reasonable mobility. Denies polyuria or polydipsia.  Denies any hematuria.  Denies any recent infections.  Denies easy bruisability.  Denies any psychiatric history.  PHYSICAL EXAMINATION:  Her blood pressure 24/68, heart rate 67, respiratory rate 16, temperature is 98.8, O2 sats 92% on room air.  She is 5 feet 4 inches tall, 187 pounds.  The patient is awake, alert, neurologically intact, unable to relate her history in good detail.  I do not appreciate any cervical, supraclavicular adenopathy.  She has no jugular venous distention.  I do not appreciate any carotid bruits. Cardiac exam reveals a  regular rate and rhythm.  She has a very soft early systolic murmur.  Lungs are clear bilateral without wheezing. Abdominal exam is benign without palpable masses or tenderness.  She has dressing over the right groin without significant hematoma from her catheterization done yesterday afternoon.  Lower extremity, she has mild pedal edema, +1 DP and PT pulses bilaterally.  LABORATORY FINDINGS:  D-dimer 0.28.  Sodium 141, glucose 103, serum creatinine of 0.53, TSH 1.96.  Hemoglobin A1c 6.3.  PT is 13.9.  INR 1.05.  Cardiac catheterization films are reviewed.  The patient has main body of the circumflex which does not appear to be symptoms severely compromised, however she has 80% ostial right lesion and segmental 80% proximal LAD lesions.  Overall ejection fraction shows 45% ejection fraction with anterior hypokinesis.  With the patient's moderate depression of LV function and both ostial right disease, proximal LAD disease with new onset of angina, I have recommended coronary artery bypass grafting to her.  Dr. Riley [name] has reviewed the films and did not think that stenting was viable option with her proximal disease with symptoms at rest.  It is unlikely that medical therapy would be successful.  The risks of the surgery including death, infection, stroke, myocardial infarction, and bleeding have all been discussed with the patient and her husband in detail.  She is willing to proceed.  We will proceed with surgery on this admission.     Katherine Plane, MD     EG/MEDQ  D:  06/14/2011  T:  06/15/2011  Job:  [id number]  cc:   [name], M.D. Katherine Morton. Riley Kill, MD, [facility]  Electronically Signed by Katherine Plane MD on 06/22/2011 06:16:18 PM

## 2011-06-27 NOTE — H&P (Signed)
NAMEANTONELA, [name] NO.:  [id number]  MEDICAL RECORD NO.:  [id number]  LOCATION:  [facility]                         FACILITY:  [facility]  PHYSICIAN:  Georga Hacking, M.D.DATE OF BIRTH:  09/29/1932  DATE OF ADMISSION:  06/13/2011                              HISTORY & PHYSICAL   REASON FOR ADMISSION:  Chest discomfort.  HISTORY:  The patient is a 77 year old female with a history of hypertension, diabetes, hyperlipidemia, hypothyroidism, and obesity. She has a chronic left bundle-branch block and is followed chronically by Dr. Riley Kill.  She was in her usual state of health and had the onset of midsternal chest tightness described as a tight feeling this evening around 6 p.m.  She presented to the emergency room because of substernal chest discomfort and found it to be mildly pleuritic.  The discomfort did not radiate to the arm or to the neck and was not associated with sweating or significant shortness of breath.  An EKG showed a baseline left bundle-branch block and initial point-of-care enzymes were normal. Her pain is diminished somewhat and she is admitted to the hospital now for treatment of unstable angina.  PAST MEDICAL HISTORY:  Remarkable for: 1. Hypertension. 2. Hyperlipidemia. 3. Diabetes mellitus. 4. Hypothyroidism. 5. Restless legs syndrome. 6. Diverticulosis.  PAST SURGERIES:  Intestinal operation for ruptured intestine with appendectomy at the same time, hysterectomy, and arthroscopic knee surgery.  ALLERGIES:  None.  CURRENT MEDICATIONS:  Actos, Hyzaar, Lipitor, Synthroid, Nexium, ReQuip, Zetia, and aspirin.  FAMILY HISTORY:  Father died of an aneurysm at age 62, ne died suddenly. Mother died of heart failure at age 25.  She has a brother who is healthy.  No premature history of cardiac disease.  SOCIAL HISTORY:  She smoked remotely, but quit 30 years ago.  Has been married for 57 years.  Lives with her husband currently.  REVIEW OF  SYSTEMS:  She has been obese for several years.  No skin problems.  She has early cataracts.  No diabetic retinopathy.  No difficulty hearing.  No difficulty swallowing.  No diarrhea, constipation, or hematochezia.  Had epistaxis with cauterization of her nose about a year ago.  No shortness of breath, cough, or wheezing normally.  Occasional arthritis that involves her hip.  There is mild low back pain, but complains of restless legs syndrome.  No history of stroke or TIA.  Other than as noted above, it is negative.  PHYSICAL EXAMINATION:  GENERAL:  She is an obese, pleasant female who is currently alert and oriented, in no acute distress. VITAL SIGNS:  Blood pressure is 159/63.  Pulse is 75 and regular. SKIN:  Warm and dry without obvious mass or lesion. ENT:  EOMI.  PERRLA.  C and S clear.  Fundi not examined.  Pharynx is negative. NECK:  Supple.  No masses, JVD, thyromegaly, or bruits.  Lymph nodes negative. LUNGS:  Clear to A and P. CARDIAC :  PMI not displaced.  Cardiac exam normal S1 and S2.  There is a soft 1/6 systolic murmur at the aortic area. ABDOMEN:  Obese, soft, and nontender without mass or hepatosplenomegaly. Femoral pulses are deep, but present.  Distal pulses are 2+.  There is 1+ edema noted.  EKG shows a left bundle-branch block with left axis deviation.  Two-view chest x-ray shows borderline heart size, mild emphysematous changes in the lung.  LABORATORY DATA:  Normal CBC.  She is hypokalemic with potassium of 3.3, alkaline phosphatase is 122.  Initial CPK was normal with normal MB. Troponin was normal.  IMPRESSION: 1. Prolonged chest discomfort and tightness with mild pleuritic     features consistent with unstable angina pectoris. 2. Hypertensive heart disease. 3. Non-insulin-dependent diabetes mellitus. 4. Hyperlipidemia, under treatment. 5. Hypothyroidism, under treatment. 6. Restless legs syndrome. 7. Left bundle-branch block. 8. Obesity. 9.  Hypokalemia on admission.  RECOMMENDATIONS:  Keep n.p.o. after midnight.  Check serial cardiac enzymes.  Begin intravenous heparin.  Further cardiac testing by [facility]. [facility].     Georga Hacking, M.D.     WST/MEDQ  D:  06/13/2011  T:  06/13/2011  Job:  [id number]  cc:   Arturo Morton. Riley Kill, MD, [facility] Veverly Fells. [place], M.D.  Electronically Signed by Lacretia Nicks. Donnie Aho M.D. on 06/27/2011 11:59:19 AM

## 2011-07-01 ENCOUNTER — Encounter: Payer: Self-pay | Admitting: Cardiothoracic Surgery

## 2011-07-01 DIAGNOSIS — N200 Calculus of kidney: Secondary | ICD-10-CM | POA: Insufficient documentation

## 2011-07-01 DIAGNOSIS — K5720 Diverticulitis of large intestine with perforation and abscess without bleeding: Secondary | ICD-10-CM | POA: Insufficient documentation

## 2011-07-01 DIAGNOSIS — O1490 Unspecified pre-eclampsia, unspecified trimester: Secondary | ICD-10-CM | POA: Insufficient documentation

## 2011-07-05 ENCOUNTER — Encounter: Payer: Self-pay | Admitting: Physician Assistant

## 2011-07-05 ENCOUNTER — Encounter: Payer: Self-pay | Admitting: *Deleted

## 2011-07-05 ENCOUNTER — Ambulatory Visit (INDEPENDENT_AMBULATORY_CARE_PROVIDER_SITE_OTHER): Payer: Medicare Other | Admitting: Physician Assistant

## 2011-07-05 VITALS — BP 142/64 | HR 65 | Ht 63.0 in | Wt 189.0 lb

## 2011-07-05 DIAGNOSIS — I2510 Atherosclerotic heart disease of native coronary artery without angina pectoris: Secondary | ICD-10-CM | POA: Insufficient documentation

## 2011-07-05 DIAGNOSIS — E785 Hyperlipidemia, unspecified: Secondary | ICD-10-CM

## 2011-07-05 DIAGNOSIS — R609 Edema, unspecified: Secondary | ICD-10-CM | POA: Insufficient documentation

## 2011-07-05 DIAGNOSIS — I10 Essential (primary) hypertension: Secondary | ICD-10-CM

## 2011-07-05 DIAGNOSIS — I6529 Occlusion and stenosis of unspecified carotid artery: Secondary | ICD-10-CM | POA: Insufficient documentation

## 2011-07-05 MED ORDER — FUROSEMIDE 40 MG PO TABS: 40.0000 mg | ORAL_TABLET | Freq: Every day | ORAL | Status: DC

## 2011-07-05 MED ORDER — METOPROLOL TARTRATE 25 MG PO TABS: ORAL_TABLET | ORAL | Status: DC

## 2011-07-05 MED ORDER — POTASSIUM CHLORIDE CRYS ER 20 MEQ PO TBCR: 20.0000 meq | EXTENDED_RELEASE_TABLET | Freq: Every day | ORAL | Status: DC

## 2011-07-05 NOTE — Patient Instructions (Addendum)
Your physician recommends that you schedule a follow-up appointment in: 4-6 weeks with Dr Riley Kill Your physician recommends that you return for lab work in: 1 week Your physician has recommended you make the following change in your medication: START Furosemide 40 mg daily and Potassium 20 mEq daily

## 2011-07-05 NOTE — Assessment & Plan Note (Signed)
Still volume overloaded which is worsened by Actos therapy.  Will restart Lasix 40 mg QD and K+ 20 mEq QD.  Repeat BMET in one week.  She states she will talk with her PCP whether or not she can change Actos to something else as she has had trouble with edema in the past with this medication.

## 2011-07-05 NOTE — Assessment & Plan Note (Signed)
Managed by PCP

## 2011-07-05 NOTE — Progress Notes (Signed)
History of Present Illness: Primary Cardiologist:  Dr.  Shawnie Pons   [name] is a 77 y.o. female who presents for post hospital follow up.  She has a history of hypertension, diabetes, hyperlipidemia, hypothyroidism, and a chronic left bundle-branch block.  She was admitted 10/7-10/16.  Presented with chest pain.  MI was r/o.  LHC 06/14/11: LAD 80% and 80% after Dx, Dx 80% (Dx was small), oOM 80%, mOM 70% (OM was small), oRCA 70-80%, mRCA 60-70%, EF 55%.  She was referred for CABG with Dr. Tyrone Sage (grafts: L-LAD and S-RCA).  Post op course notable for diarrhea; CDiff negative.  She had brief SVT but o/w remained in NSR.  Pre op dopplers: LICA 40-59%.  Echo 06/14/11: mild focal basal septal hypertrophy, grade 1 diast dysfxn, mild LAE, mild RVE, PASP 37.    CXR 10/13: small bilat effusions (L>R) Hospital Labs: Hgb 10.5, K 3.5, creatinine 0.57, ALT 19, TSH 1.966, A1C 6.3  She is doing ok.  Chest is sore.  Having a hard time sleeping.  Sometimes has to sleep in recliner.  But, denies orthopnea, PND.  LE edema is persistent.  She had a problem with this prior to CABG due to Actos.  Notes weight was going down post op.  But, now up about 5 pounds.  No syncope.  Feels fast HR when anxious.  Chest is sore.  No fevers or chills.  Notes dry cough.  Questions if it is her allergies.    Past Medical History  Diagnosis Date  . Kidney stones   . Diverticulitis of colon with perforation   . CAD (coronary artery disease)     LHC 06/14/11: LAD 80% and 80% after Dx, Dx 80% (Dx was small), oOM 80%, mOM 70% (OM was small), oRCA 70-80%, mRCA 60-70%, EF 55% - referred for CABG;   Echo 06/14/11: mild focal basal septal hypertrophy, grade 1 diast dysfxn, mild LAE, mild RVE, PASP 37.    . S/P CABG x 2 06/2011    Dr. Tyrone Sage (L-LAD, [facility])  . Carotid stenosis     dopplers 10/12: LICA 40-59%  . DM2 (diabetes mellitus, type 2)   . HTN (hypertension)   . HLD (hyperlipidemia)   . Hypothyroidism   . RLS  (restless legs syndrome)     Current Outpatient Prescriptions  Medication Sig Dispense Refill  . ALPRAZolam (XANAX) 0.25 MG tablet Take 0.25 mg by mouth at bedtime as needed.        Marland Kitchen aspirin 325 MG EC tablet Take 325 mg by mouth daily.        Marland Kitchen atorvastatin (LIPITOR) 40 MG tablet Take 40 mg by mouth daily.        . cholecalciferol (VITAMIN D) 1000 UNITS tablet Take 2,000 Units by mouth daily.        Marland Kitchen esomeprazole (NEXIUM) 40 MG capsule Take 40 mg by mouth daily before breakfast.        . ezetimibe (ZETIA) 10 MG tablet Take 10 mg by mouth daily.        Marland Kitchen levothyroxine (SYNTHROID, LEVOTHROID) 125 MCG tablet Take 125 mcg by mouth daily.        . metoprolol tartrate (LOPRESSOR) 25 MG tablet 3 tabs twice a day      . Multiple Vitamin (MULTIVITAMIN) capsule Take 1 capsule by mouth daily.        . pioglitazone (ACTOS) 15 MG tablet Take 15 mg by mouth daily.        Marland Kitchen rOPINIRole (  REQUIP) 3 MG tablet Take 3 mg by mouth at bedtime.        . traMADol (ULTRAM) 50 MG tablet Take 50 mg by mouth every 6 (six) hours as needed. Maximum dose= 8 tablets per day         Allergies: Allergies  Allergen Reactions  . Actos (Pioglitazone Hydrochloride)     Pt notes causes Pedal Edema    ROS:  Please see the history of present illness.  Has occasional diarrhea related to her diverticulosis.  All other systems reviewed and negative.   Vital Signs: BP 142/64  Ht 5\' 3"  (1.6 m)  Wt 189 lb (85.73 kg)  BMI 33.48 kg/m2  PHYSICAL EXAM: Well nourished, well developed, in no acute distress HEENT: normal Neck: no JVD Cardiac:  normal S1, S2; RRR; no murmur Chest: median sternotomy well healed without erythema or discharge Lungs:  clear to auscultation bilaterally, no wheezing, rhonchi or rales Abd: soft, nontender, no hepatomegaly Ext: 1-2+ bilateral, tight edema; RFA site without bruits Skin: warm and dry Neuro:  CNs 2-12 intact, no focal abnormalities noted Psych: Normal affect  EKG:  NSR, HR 65,  LBBB  ASSESSMENT AND PLAN:

## 2011-07-05 NOTE — Assessment & Plan Note (Signed)
Doing well post CABG.  Already has appt to go to cardiac rehab.  Sees Dr. Tyrone Sage next week.  Follow up with Dr. Riley Kill in 4-6 weeks.

## 2011-07-05 NOTE — Assessment & Plan Note (Signed)
Elevated.  Will see how addition of Lasix helps.

## 2011-07-06 ENCOUNTER — Other Ambulatory Visit: Payer: Self-pay | Admitting: Cardiothoracic Surgery

## 2011-07-06 DIAGNOSIS — I2510 Atherosclerotic heart disease of native coronary artery without angina pectoris: Secondary | ICD-10-CM

## 2011-07-11 ENCOUNTER — Ambulatory Visit (INDEPENDENT_AMBULATORY_CARE_PROVIDER_SITE_OTHER): Payer: Self-pay | Admitting: Surgical

## 2011-07-11 ENCOUNTER — Ambulatory Visit (INDEPENDENT_AMBULATORY_CARE_PROVIDER_SITE_OTHER): Payer: Medicare Other | Admitting: *Deleted

## 2011-07-11 ENCOUNTER — Ambulatory Visit
Admission: RE | Admit: 2011-07-11 | Discharge: 2011-07-11 | Disposition: A | Discharge status: Home / Self Care | Payer: Medicare Other | Source: Ambulatory Visit | Attending: Cardiothoracic Surgery | Admitting: Cardiothoracic Surgery

## 2011-07-11 VITALS — BP 138/65 | HR 66 | Resp 16 | Ht 63.0 in | Wt 180.0 lb

## 2011-07-11 DIAGNOSIS — I2510 Atherosclerotic heart disease of native coronary artery without angina pectoris: Secondary | ICD-10-CM

## 2011-07-11 DIAGNOSIS — I10 Essential (primary) hypertension: Secondary | ICD-10-CM

## 2011-07-11 LAB — BASIC METABOLIC PANEL
CO2: 27 mEq/L (ref 19–32)
Chloride: 106 mEq/L (ref 96–112)
Creatinine, Ser: 0.7 mg/dL (ref 0.4–1.2)
Potassium: 3.8 mEq/L (ref 3.5–5.1)
Sodium: 141 mEq/L (ref 135–145)

## 2011-07-11 NOTE — Progress Notes (Signed)
  HPI: Patient returns for routine postoperative follow-up having undergone off-pump coronary artery bypass grafting x2 on 06/15/2011 per Dr. Tyrone Sage for coronary occlusive disease with new onset angina at rest. The patient's early postoperative recovery while in the hospital was notable for being quite unremarkable.. Since hospital discharge the patient reports that she is doing quite well. Is not requiring any pain medication has been seen by cardiology and deemed to be some peripheral edema she has been placed back on daily Lasix and potassium daily. They plan to see her in the first week of December. She denies shortness of breath. She is not having fevers or other constitutional symptoms. Overall she is quite pleased with her progress.   Current Outpatient Prescriptions  Medication Sig Dispense Refill  . ALPRAZolam (XANAX) 0.25 MG tablet Take 0.25 mg by mouth at bedtime as needed.        Marland Kitchen aspirin 325 MG EC tablet Take 325 mg by mouth daily.        Marland Kitchen atorvastatin (LIPITOR) 40 MG tablet Take 40 mg by mouth daily.        . cholecalciferol (VITAMIN D) 1000 UNITS tablet Take 2,000 Units by mouth daily.        Marland Kitchen esomeprazole (NEXIUM) 40 MG capsule Take 40 mg by mouth daily before breakfast.        . ezetimibe (ZETIA) 10 MG tablet Take 10 mg by mouth daily.        . furosemide (LASIX) 40 MG tablet Take 1 tablet (40 mg total) by mouth daily.  30 tablet  5  . levothyroxine (SYNTHROID, LEVOTHROID) 125 MCG tablet Take 125 mcg by mouth daily.        . metoprolol tartrate (LOPRESSOR) 25 MG tablet 3 tabs twice a day  180 tablet  6  . Multiple Vitamin (MULTIVITAMIN) capsule Take 1 capsule by mouth daily.        . pioglitazone (ACTOS) 15 MG tablet Take 15 mg by mouth daily.        . potassium chloride SA (K-DUR,KLOR-CON) 20 MEQ tablet Take 1 tablet (20 mEq total) by mouth daily.  30 tablet  5  . rOPINIRole (REQUIP) 3 MG tablet Take 3 mg by mouth at bedtime.        . traMADol (ULTRAM) 50 MG tablet Take 50  mg by mouth every 6 (six) hours as needed. Maximum dose= 8 tablets per day         Physical Exam: General: Well-developed adult female in no acute distress. Cardiac: Normal S1-S2 regular rate and rhythm. Pulmonary: Clear lung fields bilaterally. Extremities: One plus bilateral lower extremity edema. Incisions: Healing well without evidence of infection.  Diagnostic Tests: A chest x-ray was obtained on today's date. It reveals clear lung fields with no significant effusions or infiltrates. There is no evidence of congestive heart failure.  Impression: [name] is doing quite well she may return to driving. I have instructed her on our usual protocol regarding this. Additionally I have instructed her on her current exercise limitations. He is scheduled to start cardiac rehabilitation later this week. We will see her again on a when necessary basis for any surgically related issues.

## 2011-07-11 NOTE — Patient Instructions (Signed)
Coronary Artery Bypass Grafting Coronary artery bypass grafting (CABG) is done to bypass or fix arteries of the heart (coronary) that have become narrow or blocked. This is usually the result of plaque built up in the walls of the vessels. The coronary arteries supply the heart with the oxygen and nutrients it needs to pump blood to your body. The heart never rests and needs constant blood flow. If an artery is partially blocked, you may have chest pain (angina). Lack of blood flow to part of the heart muscle may cause that part to die. This is what happens in a heart attack (myocardial infarction). Reasons for CABG include:  Arteries that cannot be treated with medications or other interventions (such as a heart stent).   Severe angina not responsive to other treatment.   Improving heart function.   Treating a heart attack.  Every person is unique. Be sure you understand the risks and benefits of CABG.  RISKS AND COMPLICATIONS Your surgeon will discuss these with you. Your risks will be different depending on your past and present health and other factors. It may be helpful to have a family member or advocate with you so you feel free to ask questions and get the answers you need to give an informed consent. Possible problems of CABG surgery include:  Blood loss and replacement.   Stroke.   Infection.   Surgical site pain.   Heart attack during or after.   Kidney failure.  BEFORE THE PROCEDURE  Tell your doctor about any allergies, medicines, bleeding problems and other surgeries.   Take all medicines exactly as directed. You may start new medicines and stop taking others. Do not stop medications or adjust dosages on your own. Continue taking the medications up until the time of surgery.   Perform only activities that are suggested by your caregiver.   If you are overweight, you should try to lose weight. Eat a heart-healthy diet that is low in fat and salt.   If you smoke,  quit.   Let your caregiver know if you have been on steroids (including creams or drops) for long periods of time. This is critical.   If you are diabetic discuss with your doctor whether or not you should take insulin the day of your surgery.  DAY OF SURGERY:  Plan to arrive 60 minutes before the scheduled time or as directed.   Do not eat or drink anything, including medicine, unless directed.   You will sign a written consent. You may have blood work and other tests done.   Your family will be shown where they can wait for the surgeon to talk to them when the surgery is over.  PROCEDURE Only a specially trained surgeon does a CABG assisted by a team of other health care professionals. You will be asleep and not feel any discomfort during the surgery.   Traditional method:   A cut (incision) is made down the front of the chest through the breastbone (sternum).   The sternum is spread open so the surgeon can see your heart.   You are connected to a machine that does the work of your heart and lungs and your heart stops beating.   Veins are taken from your leg(s) and used to bypass the blocked arteries of your heart. Sometimes an artery from inside your chest wall is used, either by itself or along with leg veins.   When the bypasses are done, you are taken off the machine, and  your heart is restarted and takes over again.   Alternate methods:   The heart lung machine is not used. This is called off-pump. Your heart continues to beat while the bypasses are done.   Smaller incisions are used instead of going through the middle of the chest (minimally invasive).   Intended to reduce pain and promote a faster recovery.  AFTER THE PROCEDURE  You may wake up with a tube in your throat to help your breathing. You may be connected to a breathing machine.   You will not be able to talk while the tube is in place. Try not to fight against it. The tube will be taken out as soon as it is  safe.   Even if you cannot see them, there are nurses nearby who are watching everything. You are not alone.   Your family should be prepared to see you with many tubes and wires. You will be sleepy and pale. Your family can hold your hand and speak to you, but you may have no memory of this time.  SEEK IMMEDIATE MEDICAL CARE IF:  You have severe chest pain, especially if the pain is crushing or pressure-like and spreads to the arms, back, neck, or jaw, or if you have sweating, feel sick to your stomach (nausea), or shortness of breath. THIS IS AN EMERGENCY. Do not wait to see if the pain will go away. Get medical help at once. Call your local emergency services (911 in [place] DO NOT drive yourself to the hospital.   You have an attack of chest pain lasting longer than usual, despite rest and treatment with the medications your doctor has prescribed.   You wake from sleep with chest pain.   You feel dizzy or faint.  Document Released: 06/01/2005 Document Revised: 05/04/2011 Document Reviewed: 02/06/2008 [facility] Patient Information 2012 [place], [place].

## 2011-07-18 ENCOUNTER — Ambulatory Visit (HOSPITAL_COMMUNITY): Payer: Medicare Other

## 2011-07-18 ENCOUNTER — Encounter (HOSPITAL_COMMUNITY): Payer: Medicare Other

## 2011-07-18 DIAGNOSIS — Z9861 Coronary angioplasty status: Secondary | ICD-10-CM | POA: Insufficient documentation

## 2011-07-18 DIAGNOSIS — E039 Hypothyroidism, unspecified: Secondary | ICD-10-CM | POA: Insufficient documentation

## 2011-07-18 DIAGNOSIS — I447 Left bundle-branch block, unspecified: Secondary | ICD-10-CM | POA: Insufficient documentation

## 2011-07-18 DIAGNOSIS — Z87891 Personal history of nicotine dependence: Secondary | ICD-10-CM | POA: Insufficient documentation

## 2011-07-18 DIAGNOSIS — E119 Type 2 diabetes mellitus without complications: Secondary | ICD-10-CM | POA: Insufficient documentation

## 2011-07-18 DIAGNOSIS — G2581 Restless legs syndrome: Secondary | ICD-10-CM | POA: Insufficient documentation

## 2011-07-18 DIAGNOSIS — E669 Obesity, unspecified: Secondary | ICD-10-CM | POA: Insufficient documentation

## 2011-07-18 DIAGNOSIS — Z7982 Long term (current) use of aspirin: Secondary | ICD-10-CM | POA: Insufficient documentation

## 2011-07-18 DIAGNOSIS — I200 Unstable angina: Secondary | ICD-10-CM | POA: Insufficient documentation

## 2011-07-18 DIAGNOSIS — I2510 Atherosclerotic heart disease of native coronary artery without angina pectoris: Secondary | ICD-10-CM | POA: Insufficient documentation

## 2011-07-18 DIAGNOSIS — Z5189 Encounter for other specified aftercare: Secondary | ICD-10-CM | POA: Insufficient documentation

## 2011-07-18 DIAGNOSIS — I498 Other specified cardiac arrhythmias: Secondary | ICD-10-CM | POA: Insufficient documentation

## 2011-07-18 DIAGNOSIS — I119 Hypertensive heart disease without heart failure: Secondary | ICD-10-CM | POA: Insufficient documentation

## 2011-07-18 DIAGNOSIS — E785 Hyperlipidemia, unspecified: Secondary | ICD-10-CM | POA: Insufficient documentation

## 2011-07-18 NOTE — Progress Notes (Signed)
Pt started cardiac rehab today.  Pt tolerated light exercise without difficulty.  Pt denies chest pain or dyspnea.  VSS.  Telemetry-normal sinus rhythm, nonspecific ST-T wave changes.  Will continue to monitor the patient throughout  the program. -jrion,rn

## 2011-07-20 ENCOUNTER — Ambulatory Visit (HOSPITAL_COMMUNITY): Payer: Medicare Other

## 2011-07-20 ENCOUNTER — Encounter (HOSPITAL_COMMUNITY): Payer: Medicare Other

## 2011-07-20 LAB — GLUCOSE, CAPILLARY: Glucose-Capillary: 130 mg/dL — ABNORMAL HIGH (ref 70–99)

## 2011-07-20 NOTE — Progress Notes (Signed)
[name] 77 y.o. female       Nutrition Screen                                                                    YES  NO Do you live in a nursing home?  X   Do you eat out more than 3 times/week?    X If yes, how many times per week do you eat out?  Do you have food allergies?   X If yes, what are you allergic to?  Have you gained or lost more than 10 lbs without trying?               X If yes, how much weight have you lost and over what time period?  lbs gained or lost over  weeks/month  Do you want to lose weight?    X  If yes, what is a goal weight or amount of weight you would like to lose? 20 lb  Do you eat alone most of the time?   X   Do you eat less than 2 meals/day?  X If yes, how many meals do you eat?  Do you drink more than 3 alcohol drinks/day?  X If yes, how many drinks per day?  Are you having trouble with constipation? *  X If yes, what are you doing to help relieve constipation?  Do you have financial difficulties with buying food?*    X   Are you experiencing regular nausea/ vomiting?*     X   Do you have a poor appetite? *                                        X   Do you have trouble chewing/swallowing? *   X    Pt with diagnoses of:  X CABG              X DM/A1c >6 / CBG >126 X Dyslipidemia  / HDL< 40 / LDL>70 / High TG      X %  Body fat >goal / Body Mass Index >25 X HTN / BP >120/80       Pt Risk Score    1       Diagnosis Risk Score  70       Total Risk Score   71                        X High Risk                  Low Risk              HT: 64.25" Ht Readings from Last 1 Encounters:  07/11/11 5\' 3"  (1.6 m)    WT: 187.4 lb (85.2 kg) Wt Readings from Last 3 Encounters:  07/11/11 180 lb (81.647 kg)  07/05/11 189 lb (85.73 kg)  06/04/09 185 lb 8 oz (84.142 kg)     IBW 55.1 155%IBW BMI 32.0 46.6%body fat  has a current medication list which includes the following prescription(s): alprazolam, aspirin, aspirin, atorvastatin, cetirizine,  cholecalciferol, esomeprazole,  ezetimibe, furosemide, levothyroxine, metoprolol tartrate, multivitamin, pioglitazone, potassium chloride sa, ropinirole, and tramadol. Past Medical History  Diagnosis Date  . Kidney stones   . Diverticulitis of colon with perforation   . CAD (coronary artery disease)     LHC 06/14/11: LAD 80% and 80% after Dx, Dx 80% (Dx was small), oOM 80%, mOM 70% (OM was small), oRCA 70-80%, mRCA 60-70%, EF 55% - referred for CABG;   Echo 06/14/11: mild focal basal septal hypertrophy, grade 1 diast dysfxn, mild LAE, mild RVE, PASP 37.    . S/P CABG x 2 06/2011    Dr. Tyrone Sage (L-LAD, [facility])  . Carotid stenosis     dopplers 10/12: LICA 40-59%  . DM2 (diabetes mellitus, type 2)   . HTN (hypertension)   . HLD (hyperlipidemia)   . Hypothyroidism   . RLS (restless legs syndrome)         Activity level: Pt is sedentary  Wt goal: 163-175 lb ( 74.1-79.5 kg) Current tobacco use? No       Food/Drug Interaction? No      Labs:  Lipid Panel  No results found for this basename: chol, trig, hdl, cholhdl, vldl, ldlcalc    Lab Results  Component Value Date   HGBA1C 6.3* 06/13/2011    07/11/11 Glucose 109   LDL goal:   < 70      MI, DM, Carotid or PVD and > 2:      tobacco, HTN, HDL, family h/o, lipoprotein a     > 75 yo female or        >75 yo female   Estimated Daily Nutrition Needs for: ? wt loss 1250-1500 Kcal , Total Fat 35-40gm, Saturated Fat 9-12 gm, Trans Fat 1.2-1.5 gm,  Sodium less than 1500 mg, Grams of CHO 150-175

## 2011-07-22 ENCOUNTER — Ambulatory Visit (HOSPITAL_COMMUNITY): Payer: Medicare Other

## 2011-07-22 ENCOUNTER — Encounter (HOSPITAL_COMMUNITY)
Admission: RE | Admit: 2011-07-22 | Discharge: 2011-07-22 | Disposition: A | Discharge status: Home / Self Care | Payer: Medicare Other | Source: Ambulatory Visit | Attending: Cardiology | Admitting: Cardiology

## 2011-07-22 LAB — GLUCOSE, CAPILLARY: Glucose-Capillary: 132 mg/dL — ABNORMAL HIGH (ref 70–99)

## 2011-07-25 ENCOUNTER — Ambulatory Visit (HOSPITAL_COMMUNITY): Payer: Medicare Other

## 2011-07-25 ENCOUNTER — Encounter (HOSPITAL_COMMUNITY)
Admission: RE | Admit: 2011-07-25 | Discharge: 2011-07-25 | Disposition: A | Discharge status: Home / Self Care | Payer: Medicare Other | Source: Ambulatory Visit | Attending: Cardiology | Admitting: Cardiology

## 2011-07-25 NOTE — Progress Notes (Signed)
Reviewed home exercise with pt today.  Pt plans to walk, use treadmill, and use a recumbent bicycle for exercise, plus do light dumbbell exercises.  Reviewed THR, pulse, RPE, sign and symptoms, and when to call 911 or MD.  Pt voiced understanding.

## 2011-07-27 ENCOUNTER — Encounter (HOSPITAL_COMMUNITY)
Admission: RE | Admit: 2011-07-27 | Discharge: 2011-07-27 | Disposition: A | Discharge status: Home / Self Care | Payer: Medicare Other | Source: Ambulatory Visit | Attending: Cardiology | Admitting: Cardiology

## 2011-07-27 ENCOUNTER — Ambulatory Visit (HOSPITAL_COMMUNITY): Payer: Medicare Other

## 2011-08-01 ENCOUNTER — Ambulatory Visit (HOSPITAL_COMMUNITY): Payer: Medicare Other

## 2011-08-01 ENCOUNTER — Encounter (HOSPITAL_COMMUNITY)
Admission: RE | Admit: 2011-08-01 | Discharge: 2011-08-01 | Disposition: A | Discharge status: Home / Self Care | Payer: Medicare Other | Source: Ambulatory Visit | Attending: Cardiology | Admitting: Cardiology

## 2011-08-03 ENCOUNTER — Encounter (HOSPITAL_COMMUNITY)
Admission: RE | Admit: 2011-08-03 | Discharge: 2011-08-03 | Disposition: A | Discharge status: Home / Self Care | Payer: Medicare Other | Source: Ambulatory Visit | Attending: Cardiology | Admitting: Cardiology

## 2011-08-03 ENCOUNTER — Ambulatory Visit (HOSPITAL_COMMUNITY): Payer: Medicare Other

## 2011-08-05 ENCOUNTER — Encounter (HOSPITAL_COMMUNITY)
Admission: RE | Admit: 2011-08-05 | Discharge: 2011-08-05 | Disposition: A | Discharge status: Home / Self Care | Payer: Medicare Other | Source: Ambulatory Visit | Attending: Cardiology | Admitting: Cardiology

## 2011-08-05 ENCOUNTER — Ambulatory Visit (HOSPITAL_COMMUNITY): Payer: Medicare Other

## 2011-08-08 ENCOUNTER — Ambulatory Visit (HOSPITAL_COMMUNITY): Payer: Medicare Other

## 2011-08-08 ENCOUNTER — Encounter (HOSPITAL_COMMUNITY)
Admission: RE | Admit: 2011-08-08 | Discharge: 2011-08-08 | Disposition: A | Discharge status: Home / Self Care | Payer: Medicare Other | Source: Ambulatory Visit | Attending: Cardiology | Admitting: Cardiology

## 2011-08-08 DIAGNOSIS — I200 Unstable angina: Secondary | ICD-10-CM | POA: Insufficient documentation

## 2011-08-08 DIAGNOSIS — Z5189 Encounter for other specified aftercare: Secondary | ICD-10-CM | POA: Insufficient documentation

## 2011-08-08 DIAGNOSIS — E669 Obesity, unspecified: Secondary | ICD-10-CM | POA: Insufficient documentation

## 2011-08-08 DIAGNOSIS — E119 Type 2 diabetes mellitus without complications: Secondary | ICD-10-CM | POA: Insufficient documentation

## 2011-08-08 DIAGNOSIS — I498 Other specified cardiac arrhythmias: Secondary | ICD-10-CM | POA: Insufficient documentation

## 2011-08-08 DIAGNOSIS — Z9861 Coronary angioplasty status: Secondary | ICD-10-CM | POA: Insufficient documentation

## 2011-08-08 DIAGNOSIS — I447 Left bundle-branch block, unspecified: Secondary | ICD-10-CM | POA: Insufficient documentation

## 2011-08-08 DIAGNOSIS — E785 Hyperlipidemia, unspecified: Secondary | ICD-10-CM | POA: Insufficient documentation

## 2011-08-08 DIAGNOSIS — G2581 Restless legs syndrome: Secondary | ICD-10-CM | POA: Insufficient documentation

## 2011-08-08 DIAGNOSIS — I2510 Atherosclerotic heart disease of native coronary artery without angina pectoris: Secondary | ICD-10-CM | POA: Insufficient documentation

## 2011-08-08 DIAGNOSIS — Z87891 Personal history of nicotine dependence: Secondary | ICD-10-CM | POA: Insufficient documentation

## 2011-08-08 DIAGNOSIS — I119 Hypertensive heart disease without heart failure: Secondary | ICD-10-CM | POA: Insufficient documentation

## 2011-08-08 DIAGNOSIS — Z7982 Long term (current) use of aspirin: Secondary | ICD-10-CM | POA: Insufficient documentation

## 2011-08-08 DIAGNOSIS — E039 Hypothyroidism, unspecified: Secondary | ICD-10-CM | POA: Insufficient documentation

## 2011-08-10 ENCOUNTER — Encounter (HOSPITAL_COMMUNITY)
Admission: RE | Admit: 2011-08-10 | Discharge: 2011-08-10 | Disposition: A | Discharge status: Home / Self Care | Payer: Medicare Other | Source: Ambulatory Visit | Attending: Cardiology | Admitting: Cardiology

## 2011-08-10 ENCOUNTER — Ambulatory Visit (HOSPITAL_COMMUNITY): Payer: Medicare Other

## 2011-08-11 ENCOUNTER — Ambulatory Visit (INDEPENDENT_AMBULATORY_CARE_PROVIDER_SITE_OTHER): Payer: Medicare Other | Admitting: Cardiology

## 2011-08-11 ENCOUNTER — Encounter: Payer: Self-pay | Admitting: Cardiology

## 2011-08-11 DIAGNOSIS — I2510 Atherosclerotic heart disease of native coronary artery without angina pectoris: Secondary | ICD-10-CM

## 2011-08-11 DIAGNOSIS — R609 Edema, unspecified: Secondary | ICD-10-CM

## 2011-08-11 MED ORDER — METOPROLOL TARTRATE 50 MG PO TABS: ORAL_TABLET | ORAL | Status: DC

## 2011-08-11 MED ORDER — FUROSEMIDE 20 MG PO TABS: 20.0000 mg | ORAL_TABLET | Freq: Every day | ORAL | Status: DC

## 2011-08-11 MED ORDER — POTASSIUM CHLORIDE CRYS ER 10 MEQ PO TBCR: 10.0000 meq | EXTENDED_RELEASE_TABLET | Freq: Every day | ORAL | Status: DC

## 2011-08-11 NOTE — Assessment & Plan Note (Signed)
Long term treatment with Dr. Leslie Dales.

## 2011-08-11 NOTE — Progress Notes (Signed)
Patient ID: [name], female   DOB: [birth year], 77 y.o.   MRN: [id number]

## 2011-08-11 NOTE — Progress Notes (Signed)
HPI:  She is doing well in rehab.  Denies any chest pain.  Feels good at this point.  She has done 11 rehab visits.   Current Outpatient Prescriptions  Medication Sig Dispense Refill  . ALPRAZolam (XANAX) 0.25 MG tablet Take 0.25 mg by mouth at bedtime as needed.        Marland Kitchen aspirin 81 MG tablet Take 81 mg by mouth daily.        Marland Kitchen atorvastatin (LIPITOR) 40 MG tablet Take 40 mg by mouth daily.        . cetirizine (ZYRTEC) 10 MG tablet Take 10 mg by mouth daily.        . cholecalciferol (VITAMIN D) 1000 UNITS tablet Take 2,000 Units by mouth daily.        Marland Kitchen esomeprazole (NEXIUM) 40 MG capsule Take 40 mg by mouth daily before breakfast.        . ezetimibe (ZETIA) 10 MG tablet Take 10 mg by mouth daily.        . furosemide (LASIX) 40 MG tablet Take 1 tablet (40 mg total) by mouth daily.  30 tablet  5  . levothyroxine (SYNTHROID, LEVOTHROID) 125 MCG tablet Take 125 mcg by mouth daily.        . metoprolol tartrate (LOPRESSOR) 25 MG tablet 3 tabs twice a day  180 tablet  6  . Multiple Vitamin (MULTIVITAMIN) capsule Take 1 capsule by mouth daily.        . pioglitazone (ACTOS) 15 MG tablet Take 15 mg by mouth daily.        . potassium chloride SA (K-DUR,KLOR-CON) 20 MEQ tablet Take 1 tablet (20 mEq total) by mouth daily.  30 tablet  5  . rOPINIRole (REQUIP) 3 MG tablet Take 3 mg by mouth at bedtime.        . traMADol (ULTRAM) 50 MG tablet Take 50 mg by mouth every 6 (six) hours as needed. Maximum dose= 8 tablets per day         Allergies  Allergen Reactions  . Actos (Pioglitazone Hydrochloride)     Pt notes causes Pedal Edema    Past Medical History  Diagnosis Date  . Kidney stones   . Diverticulitis of colon with perforation   . CAD (coronary artery disease)     LHC 06/14/11: LAD 80% and 80% after Dx, Dx 80% (Dx was small), oOM 80%, mOM 70% (OM was small), oRCA 70-80%, mRCA 60-70%, EF 55% - referred for CABG;   Echo 06/14/11: mild focal basal septal hypertrophy, grade 1 diast dysfxn, mild LAE,  mild RVE, PASP 37.    . S/P CABG x 2 06/2011    Dr. Tyrone Sage (L-LAD, [facility])  . Carotid stenosis     dopplers 10/12: LICA 40-59%  . DM2 (diabetes mellitus, type 2)   . HTN (hypertension)   . HLD (hyperlipidemia)   . Hypothyroidism   . RLS (restless legs syndrome)     Past Surgical History  Procedure Date  . Colon resection 1977  . Arthroscopy right knee   . Abdominal hysterectomy   . Off-pump coronary artery bypass grafting x2 06/16/2011  . Vein bypass surgery 1999    No family history on file.  History   Social History  . Marital Status: Married    Spouse Name: N/A    Number of Children: N/A  . Years of Education: N/A   Occupational History  . Not on file.   Social History Main Topics  . Smoking status:  Former Smoker    Quit date: 09/05/1980  . Smokeless tobacco: Not on file  . Alcohol Use: No  . Drug Use: No  . Sexually Active: Not on file   Other Topics Concern  . Not on file   Social History Narrative  . No narrative on file    ROS: Please see the HPI.  All other systems reviewed and negative.  PHYSICAL EXAM:  BP 128/66  Pulse 62  Ht 5\' 3"  (1.6 m)  Wt 84.823 kg (187 lb)  BMI 33.13 kg/m2  General: Well developed, well nourished, in no acute distress. Head:  Normocephalic and atraumatic. Neck: no JVD Lungs: Clear to auscultation and percussion. Heart: Normal S1 and paradoxical S2.  Minimal SEM.  No DM.   Abdomen:  Normal bowel sounds; soft; non tender; no organomegaly Pulses: Pulses normal in all 4 extremities. Extremities: No clubbing or cyanosis. No edema. Neurologic: Alert and oriented x 3.  EKG:  None done.  ASSESSMENT AND PLAN:

## 2011-08-11 NOTE — Patient Instructions (Signed)
Your physician has recommended you make the following change in your medication: DECREASE Furosemide to 20mg  daily, DECREASE Potassium to daily, CHANGE Metoprolol Tartrate to 50mg  take one and one-half tablet by mouth twice a day  Your physician recommends that you have lab work today: BMP and CBC  Your physician recommends that you schedule a follow-up appointment in: 2 MONTHS

## 2011-08-11 NOTE — Assessment & Plan Note (Signed)
Doing well post bypass.  Continue in rehab.

## 2011-08-11 NOTE — Assessment & Plan Note (Signed)
Nicely controlled.  Will give her 5omg metoprolol, one and one half tabs twice daily by mouth.

## 2011-08-11 NOTE — Assessment & Plan Note (Signed)
Very little at this point.  Will cut down dose of furosemide and KCL, and check BMET.

## 2011-08-12 ENCOUNTER — Ambulatory Visit (HOSPITAL_COMMUNITY): Payer: Medicare Other

## 2011-08-12 ENCOUNTER — Encounter (HOSPITAL_COMMUNITY)
Admission: RE | Admit: 2011-08-12 | Discharge: 2011-08-12 | Disposition: A | Discharge status: Home / Self Care | Payer: Medicare Other | Source: Ambulatory Visit | Attending: Cardiology | Admitting: Cardiology

## 2011-08-12 LAB — CBC WITH DIFFERENTIAL/PLATELET
Basophils Relative: 0.5 % (ref 0.0–3.0)
Eosinophils Absolute: 0.1 10*3/uL (ref 0.0–0.7)
Eosinophils Relative: 1.7 % (ref 0.0–5.0)
Lymphocytes Relative: 33.9 % (ref 12.0–46.0)
Monocytes Relative: 5 % (ref 3.0–12.0)
Neutrophils Relative %: 58.9 % (ref 43.0–77.0)
RBC: 4.55 Mil/uL (ref 3.87–5.11)
WBC: 8.1 10*3/uL (ref 4.5–10.5)

## 2011-08-12 LAB — BASIC METABOLIC PANEL
Calcium: 10.1 mg/dL (ref 8.4–10.5)
Creatinine, Ser: 0.7 mg/dL (ref 0.4–1.2)
GFR: 82.24 mL/min (ref >60.00)

## 2011-08-15 ENCOUNTER — Encounter (HOSPITAL_COMMUNITY): Payer: Medicare Other

## 2011-08-15 ENCOUNTER — Ambulatory Visit (HOSPITAL_COMMUNITY): Payer: Medicare Other

## 2011-08-15 ENCOUNTER — Encounter (HOSPITAL_COMMUNITY): Payer: Self-pay | Admitting: Cardiac Rehabilitation

## 2011-08-16 NOTE — Progress Notes (Unsigned)
Phone call received from pt reporting she will not attend cardiac rehab session today, c/o diverticulitis sx.

## 2011-08-17 ENCOUNTER — Ambulatory Visit (HOSPITAL_COMMUNITY): Payer: Medicare Other

## 2011-08-17 ENCOUNTER — Encounter (HOSPITAL_COMMUNITY)
Admission: RE | Admit: 2011-08-17 | Discharge: 2011-08-17 | Disposition: A | Discharge status: Home / Self Care | Payer: Medicare Other | Source: Ambulatory Visit | Attending: Cardiology | Admitting: Cardiology

## 2011-08-19 ENCOUNTER — Ambulatory Visit (HOSPITAL_COMMUNITY): Payer: Medicare Other

## 2011-08-19 ENCOUNTER — Encounter (HOSPITAL_COMMUNITY)
Admission: RE | Admit: 2011-08-19 | Discharge: 2011-08-19 | Disposition: A | Discharge status: Home / Self Care | Payer: Medicare Other | Source: Ambulatory Visit | Attending: Cardiology | Admitting: Cardiology

## 2011-08-19 NOTE — Progress Notes (Signed)
[name] 77 y.o. female Nutrition Note  Spoke with pt.  Nutrition Plan and Nutrition Survey reviewed with pt.  This Clinical research associate went over Diabetes Education test results. Weight loss tips discussed.  Pt states she has done wt watcher's in the past and was successful "but it's been years."  Pt states she gained 7 lbs since she started taking Actos "several years ago."  Pt's A1C indicates blood glucose well controlled.  Pt has attended DM education classes in the past and plans on attending the DM education classes offered in Cardiac Rehab.  Nutrition Diagnosis   Food-and nutrition-related knowledge deficit related to lack of exposure to information as related to diagnosis of: ? CVD ? DM (A1c 6.3)   Obesity related to excessive energy intake as evidenced by a BMI of 32  Nutrition RX/ Estimated Daily Nutrition Needs for: wt loss 1250-1500 Kcal, 35-40 gm fat, 9-12 gm sat fat, 1.2-1.5 gm trans-fat, <1500 mg sodium, 150-175 gm CHO   Nutrition Intervention   Pt's individual nutrition plan including cholesterol goals reviewed with pt.   Benefits of adopting Therapeutic Lifestyle Changes discussed when Medficts reviewed.   Pt to attend the Portion Distortion class   Pt to attend the  ? Nutrition I class  ? Nutrition II class ? Diabetes Blitz class   ? Diabetes Q & A class   Pt given handouts for: ? wt loss ? DM    Continue client-centered nutrition education by RD, as part of interdisciplinary care. Goal(s)   Pt to identify food quantities necessary to achieve: ? wt loss to a goal wt of 163-175 lb at graduation from cardiac rehab.    Pt to describe the benefit of including fruits, vegetables, whole grains, and low-fat dairy products in a heart healthy meal plan. Monitor and Evaluate progress toward nutrition goal with team.

## 2011-08-22 ENCOUNTER — Ambulatory Visit (HOSPITAL_COMMUNITY): Payer: Medicare Other

## 2011-08-22 ENCOUNTER — Encounter (HOSPITAL_COMMUNITY)
Admission: RE | Admit: 2011-08-22 | Discharge: 2011-08-22 | Disposition: A | Discharge status: Home / Self Care | Payer: Medicare Other | Source: Ambulatory Visit | Attending: Cardiology | Admitting: Cardiology

## 2011-08-23 ENCOUNTER — Other Ambulatory Visit: Payer: Self-pay | Admitting: Endocrinology

## 2011-08-23 ENCOUNTER — Telehealth: Payer: Self-pay | Admitting: Cardiology

## 2011-08-23 NOTE — Telephone Encounter (Signed)
Pt  had open heart 06-15-11 ,can she have a mammogram now or should she wait? ok to leave msg

## 2011-08-23 NOTE — Telephone Encounter (Signed)
I spoke with the [name] and made her aware that Dr Riley Kill would like her to contact Dr Dennie Maizes office for approval before proceeding with mammogram.

## 2011-08-24 ENCOUNTER — Other Ambulatory Visit: Payer: Self-pay | Admitting: Endocrinology

## 2011-08-24 ENCOUNTER — Encounter (HOSPITAL_COMMUNITY)
Admission: RE | Admit: 2011-08-24 | Discharge: 2011-08-24 | Disposition: A | Discharge status: Home / Self Care | Payer: Medicare Other | Source: Ambulatory Visit | Attending: Cardiology | Admitting: Cardiology

## 2011-08-24 ENCOUNTER — Ambulatory Visit (HOSPITAL_COMMUNITY): Payer: Medicare Other

## 2011-08-24 DIAGNOSIS — R921 Mammographic calcification found on diagnostic imaging of breast: Secondary | ICD-10-CM

## 2011-08-26 ENCOUNTER — Encounter (HOSPITAL_COMMUNITY)
Admission: RE | Admit: 2011-08-26 | Discharge: 2011-08-26 | Disposition: A | Discharge status: Home / Self Care | Payer: Medicare Other | Source: Ambulatory Visit | Attending: Cardiology | Admitting: Cardiology

## 2011-08-26 ENCOUNTER — Ambulatory Visit (HOSPITAL_COMMUNITY): Payer: Medicare Other

## 2011-08-31 ENCOUNTER — Encounter (HOSPITAL_COMMUNITY): Payer: Medicare Other

## 2011-08-31 ENCOUNTER — Ambulatory Visit (HOSPITAL_COMMUNITY): Payer: Medicare Other

## 2011-09-02 ENCOUNTER — Encounter (HOSPITAL_COMMUNITY): Payer: Medicare Other

## 2011-09-02 ENCOUNTER — Ambulatory Visit (HOSPITAL_COMMUNITY): Payer: Medicare Other

## 2011-09-05 ENCOUNTER — Ambulatory Visit (HOSPITAL_COMMUNITY): Payer: Medicare Other

## 2011-09-05 ENCOUNTER — Encounter (HOSPITAL_COMMUNITY): Payer: Medicare Other

## 2011-09-07 ENCOUNTER — Ambulatory Visit (HOSPITAL_COMMUNITY): Payer: Medicare Other

## 2011-09-07 ENCOUNTER — Encounter (HOSPITAL_COMMUNITY): Payer: Medicare Other

## 2011-09-07 DIAGNOSIS — I447 Left bundle-branch block, unspecified: Secondary | ICD-10-CM | POA: Insufficient documentation

## 2011-09-07 DIAGNOSIS — Z9861 Coronary angioplasty status: Secondary | ICD-10-CM | POA: Insufficient documentation

## 2011-09-07 DIAGNOSIS — E785 Hyperlipidemia, unspecified: Secondary | ICD-10-CM | POA: Insufficient documentation

## 2011-09-07 DIAGNOSIS — Z7982 Long term (current) use of aspirin: Secondary | ICD-10-CM | POA: Insufficient documentation

## 2011-09-07 DIAGNOSIS — I498 Other specified cardiac arrhythmias: Secondary | ICD-10-CM | POA: Insufficient documentation

## 2011-09-07 DIAGNOSIS — I200 Unstable angina: Secondary | ICD-10-CM | POA: Insufficient documentation

## 2011-09-07 DIAGNOSIS — I2510 Atherosclerotic heart disease of native coronary artery without angina pectoris: Secondary | ICD-10-CM | POA: Insufficient documentation

## 2011-09-07 DIAGNOSIS — E119 Type 2 diabetes mellitus without complications: Secondary | ICD-10-CM | POA: Insufficient documentation

## 2011-09-07 DIAGNOSIS — G2581 Restless legs syndrome: Secondary | ICD-10-CM | POA: Insufficient documentation

## 2011-09-07 DIAGNOSIS — E039 Hypothyroidism, unspecified: Secondary | ICD-10-CM | POA: Insufficient documentation

## 2011-09-07 DIAGNOSIS — I119 Hypertensive heart disease without heart failure: Secondary | ICD-10-CM | POA: Insufficient documentation

## 2011-09-07 DIAGNOSIS — Z87891 Personal history of nicotine dependence: Secondary | ICD-10-CM | POA: Insufficient documentation

## 2011-09-07 DIAGNOSIS — E669 Obesity, unspecified: Secondary | ICD-10-CM | POA: Insufficient documentation

## 2011-09-07 DIAGNOSIS — Z5189 Encounter for other specified aftercare: Secondary | ICD-10-CM | POA: Insufficient documentation

## 2011-09-09 ENCOUNTER — Ambulatory Visit (HOSPITAL_COMMUNITY): Payer: Medicare Other

## 2011-09-09 ENCOUNTER — Encounter (HOSPITAL_COMMUNITY)
Admission: RE | Admit: 2011-09-09 | Discharge: 2011-09-09 | Disposition: A | Discharge status: Home / Self Care | Payer: Medicare Other | Source: Ambulatory Visit | Attending: Cardiology | Admitting: Cardiology

## 2011-09-09 DIAGNOSIS — Z9861 Coronary angioplasty status: Secondary | ICD-10-CM | POA: Diagnosis not present

## 2011-09-09 DIAGNOSIS — I200 Unstable angina: Secondary | ICD-10-CM | POA: Diagnosis not present

## 2011-09-09 DIAGNOSIS — E119 Type 2 diabetes mellitus without complications: Secondary | ICD-10-CM | POA: Diagnosis not present

## 2011-09-09 DIAGNOSIS — E669 Obesity, unspecified: Secondary | ICD-10-CM | POA: Diagnosis not present

## 2011-09-09 DIAGNOSIS — I2510 Atherosclerotic heart disease of native coronary artery without angina pectoris: Secondary | ICD-10-CM | POA: Diagnosis not present

## 2011-09-09 DIAGNOSIS — E785 Hyperlipidemia, unspecified: Secondary | ICD-10-CM | POA: Diagnosis not present

## 2011-09-09 DIAGNOSIS — Z5189 Encounter for other specified aftercare: Secondary | ICD-10-CM | POA: Diagnosis not present

## 2011-09-09 DIAGNOSIS — Z87891 Personal history of nicotine dependence: Secondary | ICD-10-CM | POA: Diagnosis not present

## 2011-09-09 DIAGNOSIS — I447 Left bundle-branch block, unspecified: Secondary | ICD-10-CM | POA: Diagnosis not present

## 2011-09-09 DIAGNOSIS — I498 Other specified cardiac arrhythmias: Secondary | ICD-10-CM | POA: Diagnosis not present

## 2011-09-09 DIAGNOSIS — E039 Hypothyroidism, unspecified: Secondary | ICD-10-CM | POA: Diagnosis not present

## 2011-09-09 DIAGNOSIS — I119 Hypertensive heart disease without heart failure: Secondary | ICD-10-CM | POA: Diagnosis not present

## 2011-09-09 DIAGNOSIS — G2581 Restless legs syndrome: Secondary | ICD-10-CM | POA: Diagnosis not present

## 2011-09-09 DIAGNOSIS — Z7982 Long term (current) use of aspirin: Secondary | ICD-10-CM | POA: Diagnosis not present

## 2011-09-12 ENCOUNTER — Ambulatory Visit (HOSPITAL_COMMUNITY): Payer: Medicare Other

## 2011-09-12 ENCOUNTER — Encounter (HOSPITAL_COMMUNITY)
Admission: RE | Admit: 2011-09-12 | Discharge: 2011-09-12 | Disposition: A | Discharge status: Home / Self Care | Payer: Medicare Other | Source: Ambulatory Visit | Attending: Cardiology | Admitting: Cardiology

## 2011-09-12 DIAGNOSIS — I119 Hypertensive heart disease without heart failure: Secondary | ICD-10-CM | POA: Diagnosis not present

## 2011-09-12 DIAGNOSIS — Z9861 Coronary angioplasty status: Secondary | ICD-10-CM | POA: Diagnosis not present

## 2011-09-12 DIAGNOSIS — I2510 Atherosclerotic heart disease of native coronary artery without angina pectoris: Secondary | ICD-10-CM | POA: Diagnosis not present

## 2011-09-12 DIAGNOSIS — Z5189 Encounter for other specified aftercare: Secondary | ICD-10-CM | POA: Diagnosis not present

## 2011-09-12 DIAGNOSIS — I200 Unstable angina: Secondary | ICD-10-CM | POA: Diagnosis not present

## 2011-09-12 DIAGNOSIS — I447 Left bundle-branch block, unspecified: Secondary | ICD-10-CM | POA: Diagnosis not present

## 2011-09-14 ENCOUNTER — Encounter (HOSPITAL_COMMUNITY)
Admission: RE | Admit: 2011-09-14 | Discharge: 2011-09-14 | Disposition: A | Discharge status: Home / Self Care | Payer: Medicare Other | Source: Ambulatory Visit | Attending: Cardiology | Admitting: Cardiology

## 2011-09-14 ENCOUNTER — Ambulatory Visit (HOSPITAL_COMMUNITY): Payer: Medicare Other

## 2011-09-14 DIAGNOSIS — Z9861 Coronary angioplasty status: Secondary | ICD-10-CM | POA: Diagnosis not present

## 2011-09-14 DIAGNOSIS — I2510 Atherosclerotic heart disease of native coronary artery without angina pectoris: Secondary | ICD-10-CM | POA: Diagnosis not present

## 2011-09-14 DIAGNOSIS — I119 Hypertensive heart disease without heart failure: Secondary | ICD-10-CM | POA: Diagnosis not present

## 2011-09-14 DIAGNOSIS — I447 Left bundle-branch block, unspecified: Secondary | ICD-10-CM | POA: Diagnosis not present

## 2011-09-14 DIAGNOSIS — I200 Unstable angina: Secondary | ICD-10-CM | POA: Diagnosis not present

## 2011-09-14 DIAGNOSIS — Z5189 Encounter for other specified aftercare: Secondary | ICD-10-CM | POA: Diagnosis not present

## 2011-09-16 ENCOUNTER — Ambulatory Visit (HOSPITAL_COMMUNITY): Payer: Medicare Other

## 2011-09-16 ENCOUNTER — Encounter (HOSPITAL_COMMUNITY)
Admission: RE | Admit: 2011-09-16 | Discharge: 2011-09-16 | Disposition: A | Discharge status: Home / Self Care | Payer: Medicare Other | Source: Ambulatory Visit | Attending: Cardiology | Admitting: Cardiology

## 2011-09-16 DIAGNOSIS — I200 Unstable angina: Secondary | ICD-10-CM | POA: Diagnosis not present

## 2011-09-16 DIAGNOSIS — Z5189 Encounter for other specified aftercare: Secondary | ICD-10-CM | POA: Diagnosis not present

## 2011-09-16 DIAGNOSIS — I119 Hypertensive heart disease without heart failure: Secondary | ICD-10-CM | POA: Diagnosis not present

## 2011-09-16 DIAGNOSIS — I447 Left bundle-branch block, unspecified: Secondary | ICD-10-CM | POA: Diagnosis not present

## 2011-09-16 DIAGNOSIS — I2510 Atherosclerotic heart disease of native coronary artery without angina pectoris: Secondary | ICD-10-CM | POA: Diagnosis not present

## 2011-09-16 DIAGNOSIS — Z9861 Coronary angioplasty status: Secondary | ICD-10-CM | POA: Diagnosis not present

## 2011-09-19 ENCOUNTER — Encounter (HOSPITAL_COMMUNITY): Payer: Medicare Other

## 2011-09-19 ENCOUNTER — Ambulatory Visit (HOSPITAL_COMMUNITY): Payer: Medicare Other

## 2011-09-21 ENCOUNTER — Ambulatory Visit (HOSPITAL_COMMUNITY): Payer: Medicare Other

## 2011-09-21 ENCOUNTER — Encounter (HOSPITAL_COMMUNITY)
Admission: RE | Admit: 2011-09-21 | Discharge: 2011-09-21 | Disposition: A | Discharge status: Home / Self Care | Payer: Medicare Other | Source: Ambulatory Visit | Attending: Cardiology | Admitting: Cardiology

## 2011-09-21 DIAGNOSIS — Z9861 Coronary angioplasty status: Secondary | ICD-10-CM | POA: Diagnosis not present

## 2011-09-21 DIAGNOSIS — Z5189 Encounter for other specified aftercare: Secondary | ICD-10-CM | POA: Diagnosis not present

## 2011-09-21 DIAGNOSIS — I447 Left bundle-branch block, unspecified: Secondary | ICD-10-CM | POA: Diagnosis not present

## 2011-09-21 DIAGNOSIS — I119 Hypertensive heart disease without heart failure: Secondary | ICD-10-CM | POA: Diagnosis not present

## 2011-09-21 DIAGNOSIS — I200 Unstable angina: Secondary | ICD-10-CM | POA: Diagnosis not present

## 2011-09-21 DIAGNOSIS — I2510 Atherosclerotic heart disease of native coronary artery without angina pectoris: Secondary | ICD-10-CM | POA: Diagnosis not present

## 2011-09-23 ENCOUNTER — Encounter (HOSPITAL_COMMUNITY)
Admission: RE | Admit: 2011-09-23 | Discharge: 2011-09-23 | Disposition: A | Discharge status: Home / Self Care | Payer: Medicare Other | Source: Ambulatory Visit | Attending: Cardiology | Admitting: Cardiology

## 2011-09-23 ENCOUNTER — Ambulatory Visit (HOSPITAL_COMMUNITY): Payer: Medicare Other

## 2011-09-23 DIAGNOSIS — I2510 Atherosclerotic heart disease of native coronary artery without angina pectoris: Secondary | ICD-10-CM | POA: Diagnosis not present

## 2011-09-23 DIAGNOSIS — Z9861 Coronary angioplasty status: Secondary | ICD-10-CM | POA: Diagnosis not present

## 2011-09-23 DIAGNOSIS — Z5189 Encounter for other specified aftercare: Secondary | ICD-10-CM | POA: Diagnosis not present

## 2011-09-23 DIAGNOSIS — I447 Left bundle-branch block, unspecified: Secondary | ICD-10-CM | POA: Diagnosis not present

## 2011-09-23 DIAGNOSIS — I119 Hypertensive heart disease without heart failure: Secondary | ICD-10-CM | POA: Diagnosis not present

## 2011-09-23 DIAGNOSIS — I200 Unstable angina: Secondary | ICD-10-CM | POA: Diagnosis not present

## 2011-09-26 ENCOUNTER — Encounter (HOSPITAL_COMMUNITY)
Admission: RE | Admit: 2011-09-26 | Discharge: 2011-09-26 | Disposition: A | Discharge status: Home / Self Care | Payer: Medicare Other | Source: Ambulatory Visit | Attending: Cardiology | Admitting: Cardiology

## 2011-09-26 ENCOUNTER — Ambulatory Visit (HOSPITAL_COMMUNITY): Payer: Medicare Other

## 2011-09-26 DIAGNOSIS — Z9861 Coronary angioplasty status: Secondary | ICD-10-CM | POA: Diagnosis not present

## 2011-09-26 DIAGNOSIS — I447 Left bundle-branch block, unspecified: Secondary | ICD-10-CM | POA: Diagnosis not present

## 2011-09-26 DIAGNOSIS — I2510 Atherosclerotic heart disease of native coronary artery without angina pectoris: Secondary | ICD-10-CM | POA: Diagnosis not present

## 2011-09-26 DIAGNOSIS — I200 Unstable angina: Secondary | ICD-10-CM | POA: Diagnosis not present

## 2011-09-26 DIAGNOSIS — Z5189 Encounter for other specified aftercare: Secondary | ICD-10-CM | POA: Diagnosis not present

## 2011-09-26 DIAGNOSIS — I119 Hypertensive heart disease without heart failure: Secondary | ICD-10-CM | POA: Diagnosis not present

## 2011-09-28 ENCOUNTER — Encounter (HOSPITAL_COMMUNITY)
Admission: RE | Admit: 2011-09-28 | Discharge: 2011-09-28 | Disposition: A | Discharge status: Home / Self Care | Payer: Medicare Other | Source: Ambulatory Visit | Attending: Cardiology | Admitting: Cardiology

## 2011-09-28 ENCOUNTER — Ambulatory Visit (HOSPITAL_COMMUNITY): Payer: Medicare Other

## 2011-09-28 DIAGNOSIS — Z5189 Encounter for other specified aftercare: Secondary | ICD-10-CM | POA: Diagnosis not present

## 2011-09-28 DIAGNOSIS — I200 Unstable angina: Secondary | ICD-10-CM | POA: Diagnosis not present

## 2011-09-28 DIAGNOSIS — Z9861 Coronary angioplasty status: Secondary | ICD-10-CM | POA: Diagnosis not present

## 2011-09-28 DIAGNOSIS — I447 Left bundle-branch block, unspecified: Secondary | ICD-10-CM | POA: Diagnosis not present

## 2011-09-28 DIAGNOSIS — I119 Hypertensive heart disease without heart failure: Secondary | ICD-10-CM | POA: Diagnosis not present

## 2011-09-28 DIAGNOSIS — I2510 Atherosclerotic heart disease of native coronary artery without angina pectoris: Secondary | ICD-10-CM | POA: Diagnosis not present

## 2011-09-30 ENCOUNTER — Ambulatory Visit (HOSPITAL_COMMUNITY): Payer: Medicare Other

## 2011-09-30 ENCOUNTER — Encounter (HOSPITAL_COMMUNITY): Payer: Medicare Other

## 2011-10-03 ENCOUNTER — Ambulatory Visit (HOSPITAL_COMMUNITY): Payer: Medicare Other

## 2011-10-03 ENCOUNTER — Encounter (HOSPITAL_COMMUNITY)
Admission: RE | Admit: 2011-10-03 | Discharge: 2011-10-03 | Disposition: A | Discharge status: Home / Self Care | Payer: Medicare Other | Source: Ambulatory Visit | Attending: Cardiology | Admitting: Cardiology

## 2011-10-03 DIAGNOSIS — Z9861 Coronary angioplasty status: Secondary | ICD-10-CM | POA: Diagnosis not present

## 2011-10-03 DIAGNOSIS — I447 Left bundle-branch block, unspecified: Secondary | ICD-10-CM | POA: Diagnosis not present

## 2011-10-03 DIAGNOSIS — Z5189 Encounter for other specified aftercare: Secondary | ICD-10-CM | POA: Diagnosis not present

## 2011-10-03 DIAGNOSIS — I200 Unstable angina: Secondary | ICD-10-CM | POA: Diagnosis not present

## 2011-10-03 DIAGNOSIS — I119 Hypertensive heart disease without heart failure: Secondary | ICD-10-CM | POA: Diagnosis not present

## 2011-10-03 DIAGNOSIS — I2510 Atherosclerotic heart disease of native coronary artery without angina pectoris: Secondary | ICD-10-CM | POA: Diagnosis not present

## 2011-10-05 ENCOUNTER — Encounter (HOSPITAL_COMMUNITY): Payer: Medicare Other

## 2011-10-05 ENCOUNTER — Ambulatory Visit (HOSPITAL_COMMUNITY): Payer: Medicare Other

## 2011-10-06 DIAGNOSIS — G609 Hereditary and idiopathic neuropathy, unspecified: Secondary | ICD-10-CM | POA: Diagnosis not present

## 2011-10-07 ENCOUNTER — Encounter (HOSPITAL_COMMUNITY): Payer: Medicare Other

## 2011-10-07 ENCOUNTER — Ambulatory Visit (HOSPITAL_COMMUNITY): Payer: Medicare Other

## 2011-10-07 DIAGNOSIS — M549 Dorsalgia, unspecified: Secondary | ICD-10-CM | POA: Diagnosis not present

## 2011-10-07 DIAGNOSIS — IMO0002 Reserved for concepts with insufficient information to code with codable children: Secondary | ICD-10-CM | POA: Diagnosis not present

## 2011-10-10 ENCOUNTER — Ambulatory Visit (HOSPITAL_COMMUNITY): Payer: Medicare Other

## 2011-10-10 ENCOUNTER — Encounter (HOSPITAL_COMMUNITY)
Admission: RE | Admit: 2011-10-10 | Discharge: 2011-10-10 | Disposition: A | Discharge status: Home / Self Care | Payer: Medicare Other | Source: Ambulatory Visit | Attending: Cardiology | Admitting: Cardiology

## 2011-10-10 DIAGNOSIS — Z5189 Encounter for other specified aftercare: Secondary | ICD-10-CM | POA: Diagnosis not present

## 2011-10-10 DIAGNOSIS — I2510 Atherosclerotic heart disease of native coronary artery without angina pectoris: Secondary | ICD-10-CM | POA: Insufficient documentation

## 2011-10-10 DIAGNOSIS — E119 Type 2 diabetes mellitus without complications: Secondary | ICD-10-CM | POA: Diagnosis not present

## 2011-10-10 DIAGNOSIS — Z9861 Coronary angioplasty status: Secondary | ICD-10-CM | POA: Diagnosis not present

## 2011-10-10 DIAGNOSIS — E039 Hypothyroidism, unspecified: Secondary | ICD-10-CM | POA: Insufficient documentation

## 2011-10-10 DIAGNOSIS — E669 Obesity, unspecified: Secondary | ICD-10-CM | POA: Insufficient documentation

## 2011-10-10 DIAGNOSIS — I119 Hypertensive heart disease without heart failure: Secondary | ICD-10-CM | POA: Diagnosis not present

## 2011-10-10 DIAGNOSIS — I200 Unstable angina: Secondary | ICD-10-CM | POA: Diagnosis not present

## 2011-10-10 DIAGNOSIS — I447 Left bundle-branch block, unspecified: Secondary | ICD-10-CM | POA: Insufficient documentation

## 2011-10-10 DIAGNOSIS — E785 Hyperlipidemia, unspecified: Secondary | ICD-10-CM | POA: Insufficient documentation

## 2011-10-10 DIAGNOSIS — Z7982 Long term (current) use of aspirin: Secondary | ICD-10-CM | POA: Insufficient documentation

## 2011-10-10 DIAGNOSIS — Z87891 Personal history of nicotine dependence: Secondary | ICD-10-CM | POA: Insufficient documentation

## 2011-10-10 DIAGNOSIS — G2581 Restless legs syndrome: Secondary | ICD-10-CM | POA: Insufficient documentation

## 2011-10-10 DIAGNOSIS — I498 Other specified cardiac arrhythmias: Secondary | ICD-10-CM | POA: Insufficient documentation

## 2011-10-10 LAB — GLUCOSE, CAPILLARY: Glucose-Capillary: 178 mg/dL — ABNORMAL HIGH (ref 70–99)

## 2011-10-11 ENCOUNTER — Ambulatory Visit
Admission: RE | Admit: 2011-10-11 | Discharge: 2011-10-11 | Disposition: A | Discharge status: Home / Self Care | Payer: Medicare Other | Source: Ambulatory Visit | Attending: Endocrinology | Admitting: Endocrinology

## 2011-10-11 ENCOUNTER — Other Ambulatory Visit: Payer: Self-pay | Admitting: Endocrinology

## 2011-10-11 DIAGNOSIS — R921 Mammographic calcification found on diagnostic imaging of breast: Secondary | ICD-10-CM

## 2011-10-11 DIAGNOSIS — R928 Other abnormal and inconclusive findings on diagnostic imaging of breast: Secondary | ICD-10-CM | POA: Diagnosis not present

## 2011-10-11 DIAGNOSIS — N6009 Solitary cyst of unspecified breast: Secondary | ICD-10-CM | POA: Diagnosis not present

## 2011-10-11 DIAGNOSIS — N63 Unspecified lump in unspecified breast: Secondary | ICD-10-CM | POA: Diagnosis not present

## 2011-10-12 ENCOUNTER — Encounter (HOSPITAL_COMMUNITY): Payer: Medicare Other

## 2011-10-12 ENCOUNTER — Encounter (HOSPITAL_COMMUNITY)
Admission: RE | Admit: 2011-10-12 | Discharge: 2011-10-12 | Disposition: A | Discharge status: Home / Self Care | Payer: Medicare Other | Source: Ambulatory Visit | Attending: Cardiology | Admitting: Cardiology

## 2011-10-12 ENCOUNTER — Ambulatory Visit (HOSPITAL_COMMUNITY): Payer: Medicare Other

## 2011-10-14 ENCOUNTER — Encounter (HOSPITAL_COMMUNITY)
Admission: RE | Admit: 2011-10-14 | Discharge: 2011-10-14 | Disposition: A | Discharge status: Home / Self Care | Payer: Medicare Other | Source: Ambulatory Visit | Attending: Cardiology | Admitting: Cardiology

## 2011-10-14 ENCOUNTER — Ambulatory Visit (HOSPITAL_COMMUNITY): Payer: Medicare Other

## 2011-10-14 ENCOUNTER — Encounter (HOSPITAL_COMMUNITY): Payer: Medicare Other

## 2011-10-17 ENCOUNTER — Encounter (HOSPITAL_COMMUNITY): Payer: Medicare Other

## 2011-10-17 ENCOUNTER — Ambulatory Visit (HOSPITAL_COMMUNITY): Payer: Medicare Other

## 2011-10-17 ENCOUNTER — Encounter (HOSPITAL_COMMUNITY)
Admission: RE | Admit: 2011-10-17 | Discharge: 2011-10-17 | Disposition: A | Discharge status: Home / Self Care | Payer: Medicare Other | Source: Ambulatory Visit | Attending: Cardiology | Admitting: Cardiology

## 2011-10-19 ENCOUNTER — Ambulatory Visit (HOSPITAL_COMMUNITY): Payer: Medicare Other

## 2011-10-19 ENCOUNTER — Encounter (HOSPITAL_COMMUNITY): Payer: Medicare Other

## 2011-10-19 ENCOUNTER — Encounter (HOSPITAL_COMMUNITY)
Admission: RE | Admit: 2011-10-19 | Discharge: 2011-10-19 | Disposition: A | Discharge status: Home / Self Care | Payer: Medicare Other | Source: Ambulatory Visit | Attending: Cardiology | Admitting: Cardiology

## 2011-10-20 ENCOUNTER — Other Ambulatory Visit: Payer: Self-pay | Admitting: Endocrinology

## 2011-10-20 ENCOUNTER — Ambulatory Visit
Admission: RE | Admit: 2011-10-20 | Discharge: 2011-10-20 | Disposition: A | Discharge status: Home / Self Care | Payer: Medicare Other | Source: Ambulatory Visit | Attending: Endocrinology | Admitting: Endocrinology

## 2011-10-20 DIAGNOSIS — R921 Mammographic calcification found on diagnostic imaging of breast: Secondary | ICD-10-CM

## 2011-10-20 DIAGNOSIS — N6489 Other specified disorders of breast: Secondary | ICD-10-CM | POA: Diagnosis not present

## 2011-10-20 DIAGNOSIS — C50919 Malignant neoplasm of unspecified site of unspecified female breast: Secondary | ICD-10-CM | POA: Insufficient documentation

## 2011-10-20 DIAGNOSIS — C50519 Malignant neoplasm of lower-outer quadrant of unspecified female breast: Secondary | ICD-10-CM | POA: Diagnosis not present

## 2011-10-20 HISTORY — DX: Malignant neoplasm of unspecified site of unspecified female breast: C50.919

## 2011-10-20 HISTORY — PX: BREAST BIOPSY: SHX20

## 2011-10-20 HISTORY — PX: OTHER SURGICAL HISTORY: SHX169

## 2011-10-21 ENCOUNTER — Encounter (HOSPITAL_COMMUNITY): Payer: Medicare Other

## 2011-10-21 ENCOUNTER — Other Ambulatory Visit: Payer: Self-pay | Admitting: Endocrinology

## 2011-10-21 ENCOUNTER — Ambulatory Visit (HOSPITAL_COMMUNITY): Payer: Medicare Other

## 2011-10-21 ENCOUNTER — Ambulatory Visit
Admission: RE | Admit: 2011-10-21 | Discharge: 2011-10-21 | Disposition: A | Discharge status: Home / Self Care | Payer: Medicare Other | Source: Ambulatory Visit | Attending: Endocrinology | Admitting: Endocrinology

## 2011-10-21 DIAGNOSIS — R921 Mammographic calcification found on diagnostic imaging of breast: Secondary | ICD-10-CM

## 2011-10-21 DIAGNOSIS — C50912 Malignant neoplasm of unspecified site of left female breast: Secondary | ICD-10-CM

## 2011-10-21 DIAGNOSIS — D0590 Unspecified type of carcinoma in situ of unspecified breast: Secondary | ICD-10-CM | POA: Diagnosis not present

## 2011-10-24 ENCOUNTER — Ambulatory Visit (HOSPITAL_COMMUNITY): Payer: Medicare Other

## 2011-10-24 ENCOUNTER — Encounter (HOSPITAL_COMMUNITY)
Admission: RE | Admit: 2011-10-24 | Discharge: 2011-10-24 | Disposition: A | Discharge status: Home / Self Care | Payer: Medicare Other | Source: Ambulatory Visit | Attending: Cardiology | Admitting: Cardiology

## 2011-10-26 ENCOUNTER — Encounter (HOSPITAL_COMMUNITY): Payer: Medicare Other

## 2011-10-26 ENCOUNTER — Encounter (HOSPITAL_COMMUNITY)
Admission: RE | Admit: 2011-10-26 | Discharge: 2011-10-26 | Disposition: A | Discharge status: Home / Self Care | Payer: Medicare Other | Source: Ambulatory Visit | Attending: Cardiology | Admitting: Cardiology

## 2011-10-26 ENCOUNTER — Ambulatory Visit (HOSPITAL_COMMUNITY): Payer: Medicare Other

## 2011-10-27 ENCOUNTER — Ambulatory Visit
Admission: RE | Admit: 2011-10-27 | Discharge: 2011-10-27 | Disposition: A | Discharge status: Home / Self Care | Payer: Medicare Other | Source: Ambulatory Visit | Attending: Endocrinology | Admitting: Endocrinology

## 2011-10-27 DIAGNOSIS — C50519 Malignant neoplasm of lower-outer quadrant of unspecified female breast: Secondary | ICD-10-CM | POA: Diagnosis not present

## 2011-10-27 DIAGNOSIS — C50912 Malignant neoplasm of unspecified site of left female breast: Secondary | ICD-10-CM

## 2011-10-27 MED ORDER — GADOBENATE DIMEGLUMINE 529 MG/ML IV SOLN
17.0000 mL | Freq: Once | INTRAVENOUS | Status: AC | PRN
  Administered 2011-10-27: 17 mL via INTRAVENOUS

## 2011-10-28 ENCOUNTER — Ambulatory Visit (HOSPITAL_COMMUNITY): Payer: Medicare Other

## 2011-10-28 ENCOUNTER — Encounter (HOSPITAL_COMMUNITY)
Admission: RE | Admit: 2011-10-28 | Discharge: 2011-10-28 | Disposition: A | Discharge status: Home / Self Care | Payer: Medicare Other | Source: Ambulatory Visit | Attending: Cardiology | Admitting: Cardiology

## 2011-10-31 ENCOUNTER — Encounter (HOSPITAL_COMMUNITY)
Admission: RE | Admit: 2011-10-31 | Discharge: 2011-10-31 | Disposition: A | Discharge status: Home / Self Care | Payer: Medicare Other | Source: Ambulatory Visit | Attending: Cardiology | Admitting: Cardiology

## 2011-10-31 ENCOUNTER — Ambulatory Visit (HOSPITAL_COMMUNITY): Payer: Medicare Other

## 2011-10-31 ENCOUNTER — Encounter: Payer: Self-pay | Admitting: Cardiology

## 2011-10-31 ENCOUNTER — Ambulatory Visit (INDEPENDENT_AMBULATORY_CARE_PROVIDER_SITE_OTHER): Payer: Medicare Other | Admitting: Cardiology

## 2011-10-31 VITALS — BP 148/80 | HR 49 | Ht 63.0 in | Wt 193.8 lb

## 2011-10-31 DIAGNOSIS — I10 Essential (primary) hypertension: Secondary | ICD-10-CM | POA: Diagnosis not present

## 2011-10-31 DIAGNOSIS — E785 Hyperlipidemia, unspecified: Secondary | ICD-10-CM

## 2011-10-31 DIAGNOSIS — C50919 Malignant neoplasm of unspecified site of unspecified female breast: Secondary | ICD-10-CM

## 2011-10-31 DIAGNOSIS — I2510 Atherosclerotic heart disease of native coronary artery without angina pectoris: Secondary | ICD-10-CM | POA: Diagnosis not present

## 2011-10-31 DIAGNOSIS — I447 Left bundle-branch block, unspecified: Secondary | ICD-10-CM

## 2011-10-31 DIAGNOSIS — C50912 Malignant neoplasm of unspecified site of left female breast: Secondary | ICD-10-CM

## 2011-10-31 LAB — BASIC METABOLIC PANEL
BUN: 16 mg/dL (ref 6–23)
CO2: 28 mEq/L (ref 19–32)
Chloride: 109 mEq/L (ref 96–112)
Creatinine, Ser: 0.7 mg/dL (ref 0.4–1.2)

## 2011-10-31 NOTE — Assessment & Plan Note (Signed)
Doing well.  Will get bmet as she has been on furosemide.  Will get a BMET.

## 2011-10-31 NOTE — Assessment & Plan Note (Signed)
Will need lipid at Dr. Altheimer's office.

## 2011-10-31 NOTE — Progress Notes (Signed)
HPI:  From the cardiac standpoint [name] is doing well. She's been to cardiac rehabilitation, and overall has had not had any problems. She is in her last sessions.  She denies chest pain, and is doing well.  Unfortunately, she now has a new diagnosis of breast cancer, and will require surgery in the near future.  She is seeing [facility].    Current Outpatient Prescriptions  Medication Sig Dispense Refill  . aspirin 81 MG tablet Take 81 mg by mouth daily.        Marland Kitchen atorvastatin (LIPITOR) 40 MG tablet Take 40 mg by mouth daily.        . cetirizine (ZYRTEC) 10 MG tablet Take 10 mg by mouth as needed.       . cholecalciferol (VITAMIN D) 1000 UNITS tablet Take 2,000 Units by mouth daily.        . diazepam (VALIUM) 5 MG tablet Take 5 mg by mouth every 6 (six) hours as needed.       Marland Kitchen esomeprazole (NEXIUM) 40 MG capsule Take 40 mg by mouth daily before breakfast.        . ezetimibe (ZETIA) 10 MG tablet Take 10 mg by mouth daily.        . furosemide (LASIX) 20 MG tablet Take 1 tablet (20 mg total) by mouth daily.  90 tablet  3  . HYDROcodone-acetaminophen (VICODIN) 5-500 MG per tablet Take 1 tablet by mouth every 6 (six) hours as needed.      Marland Kitchen levothyroxine (SYNTHROID, LEVOTHROID) 125 MCG tablet Take 125 mcg by mouth daily.        . metoprolol tartrate (LOPRESSOR) 50 MG tablet Take one and one-half tablet by mouth twice a day  270 tablet  3  . Multiple Vitamin (MULTIVITAMIN) capsule Take 1 capsule by mouth daily.        . pioglitazone (ACTOS) 15 MG tablet Take 15 mg by mouth daily.        . potassium chloride SA (K-DUR,KLOR-CON) 10 MEQ tablet Take 1 tablet (10 mEq total) by mouth daily.  90 tablet  3  . rOPINIRole (REQUIP) 3 MG tablet Take 3 mg by mouth at bedtime.        . traMADol (ULTRAM) 50 MG tablet Take 50 mg by mouth every 6 (six) hours as needed. Maximum dose= 8 tablets per day         Allergies  Allergen Reactions  . Actos (Pioglitazone Hydrochloride)     Pt notes  causes Pedal Edema    Past Medical History  Diagnosis Date  . Kidney stones   . Diverticulitis of colon with perforation   . CAD (coronary artery disease)     LHC 06/14/11: LAD 80% and 80% after Dx, Dx 80% (Dx was small), oOM 80%, mOM 70% (OM was small), oRCA 70-80%, mRCA 60-70%, EF 55% - referred for CABG;   Echo 06/14/11: mild focal basal septal hypertrophy, grade 1 diast dysfxn, mild LAE, mild RVE, PASP 37.    . S/P CABG x 2 06/2011    Dr. Tyrone Sage (L[facility]LAD, [facility])  . Carotid stenosis     dopplers 10/12: LICA 40-59%  . DM2 (diabetes mellitus, type 2)   . HTN (hypertension)   . HLD (hyperlipidemia)   . Hypothyroidism   . RLS (restless legs syndrome)     Past Surgical History  Procedure Date  . Colon resection 1977  . Arthroscopy right knee   . Abdominal hysterectomy   . Off-pump  coronary artery bypass grafting x2 06/16/2011  . Vein bypass surgery 1999    No family history on file.  History   Social History  . Marital Status: Married    Spouse Name: N/A    Number of Children: N/A  . Years of Education: N/A   Occupational History  . Not on file.   Social History Main Topics  . Smoking status: Former Smoker    Quit date: 09/05/1980  . Smokeless tobacco: Not on file  . Alcohol Use: No  . Drug Use: No  . Sexually Active: Not on file   Other Topics Concern  . Not on file   Social History Narrative  . No narrative on file    ROS: Please see the HPI.  All other systems reviewed and negative.  PHYSICAL EXAM:  BP 148/80  Pulse 49  Ht 5\' 3"  (1.6 m)  Wt 193 lb 12.8 oz (87.907 kg)  BMI 34.33 kg/m2  General: Well developed, well nourished, in no acute distress. Head:  Normocephalic and atraumatic. Neck: no JVD Lungs: Clear to auscultation and percussion. Heart: Normal S1 and S2.  No murmur, rubs or gallops.  Abdomen:  Normal bowel sounds; soft; non tender; no organomegaly Pulses: Pulses normal in all 4 extremities. Extremities: No clubbing or cyanosis. No  edema. Neurologic: Alert and oriented x 3.  EKG:  NSR.  LBBB.   ASSESSMENT AND PLAN:

## 2011-10-31 NOTE — Assessment & Plan Note (Signed)
chronic

## 2011-10-31 NOTE — Assessment & Plan Note (Signed)
Borderline.  May require more treatment.

## 2011-10-31 NOTE — Patient Instructions (Signed)
Your physician recommends that you have lab work today: [facility]  Your physician recommends that you schedule a follow-up appointment in: 6 WEEKS  Your physician recommends that you continue on your current medications as directed. Please refer to the Current Medication list given to you today.

## 2011-11-01 ENCOUNTER — Other Ambulatory Visit: Payer: Self-pay | Admitting: Internal Medicine

## 2011-11-01 ENCOUNTER — Encounter (INDEPENDENT_AMBULATORY_CARE_PROVIDER_SITE_OTHER): Payer: Self-pay | Admitting: Surgery

## 2011-11-01 ENCOUNTER — Other Ambulatory Visit (INDEPENDENT_AMBULATORY_CARE_PROVIDER_SITE_OTHER): Payer: Self-pay | Admitting: Surgery

## 2011-11-01 ENCOUNTER — Ambulatory Visit (INDEPENDENT_AMBULATORY_CARE_PROVIDER_SITE_OTHER): Payer: Medicare Other | Admitting: Surgery

## 2011-11-01 ENCOUNTER — Telehealth: Payer: Self-pay | Admitting: Cardiology

## 2011-11-01 ENCOUNTER — Encounter (HOSPITAL_BASED_OUTPATIENT_CLINIC_OR_DEPARTMENT_OTHER): Payer: Self-pay | Admitting: *Deleted

## 2011-11-01 VITALS — BP 130/82 | HR 78 | Temp 98.6°F | Resp 18 | Ht 64.0 in | Wt 193.0 lb

## 2011-11-01 DIAGNOSIS — C50912 Malignant neoplasm of unspecified site of left female breast: Secondary | ICD-10-CM

## 2011-11-01 DIAGNOSIS — C50919 Malignant neoplasm of unspecified site of unspecified female breast: Secondary | ICD-10-CM | POA: Diagnosis not present

## 2011-11-01 HISTORY — DX: Malignant neoplasm of unspecified site of left female breast: C50.912

## 2011-11-01 NOTE — Telephone Encounter (Signed)
Pt seeing Dr Corliss Skains at this time.  Pattricia Boss will call back once she knows Dr Fatima Sanger plans.

## 2011-11-01 NOTE — Progress Notes (Signed)
Patient ID: [name], female   DOB: 06/28/1933, 78 y.o.   MRN: [id number]  Chief Complaint  Patient presents with  . Breast Cancer    HPI [name] is a 78 y.o. female.  Referred by Dr. Christiana Pellant for new left breast cancer Primary physician - Dr. Casimiro Needle Altheimer Cardiologist - Dr. Shawnie Pons  HPI This is a 78 yo female who is s/p CABG in 10/12 by Dr. Tyrone Sage who has been followed for the last two years with q6 month mammograms for questionable microcalcifications in the left breast.  She underwent stereotactic biopsy which showed invasive ductal carcinoma with DCIS, lymphovascular invasion, ER+, PR+, Ki67 47%, Her-2 negative.  MRI showed a 3 mm focus of enhancement at the margin of the biopsy cavity with some scattered enhancement in the central breast.  Bracketing is recommended for lumpectomy.  Past Medical History  Diagnosis Date  . Kidney stones   . Diverticulitis of colon with perforation   . CAD (coronary artery disease)     LHC 06/14/11: LAD 80% and 80% after Dx, Dx 80% (Dx was small), oOM 80%, mOM 70% (OM was small), oRCA 70-80%, mRCA 60-70%, EF 55% - referred for CABG;   Echo 06/14/11: mild focal basal septal hypertrophy, grade 1 diast dysfxn, mild LAE, mild RVE, PASP 37.    . S/P CABG x 2 06/2011    Dr. Tyrone Sage (L[facility]LAD, [facility])  . Carotid stenosis     dopplers 10/12: LICA 40-59%  . DM2 (diabetes mellitus, type 2)   . HTN (hypertension)   . HLD (hyperlipidemia)   . Hypothyroidism   . RLS (restless legs syndrome)     Past Surgical History  Procedure Date  . Colon resection 1977  . Arthroscopy right knee   . Abdominal hysterectomy   . Off-pump coronary artery bypass grafting x2 06/16/2011  . Vein bypass surgery 1999    No family history on file.  Social History History  Substance Use Topics  . Smoking status: Former Smoker    Quit date: 09/05/1980  . Smokeless tobacco: Not on file  . Alcohol Use: Yes    No Known Allergies  Current  Outpatient Prescriptions  Medication Sig Dispense Refill  . aspirin 81 MG tablet Take 81 mg by mouth daily.        Marland Kitchen atorvastatin (LIPITOR) 40 MG tablet Take 40 mg by mouth daily.        . cetirizine (ZYRTEC) 10 MG tablet Take 10 mg by mouth as needed.       . cholecalciferol (VITAMIN D) 1000 UNITS tablet Take 2,000 Units by mouth daily.        . diazepam (VALIUM) 5 MG tablet Take 5 mg by mouth every 6 (six) hours as needed.       Marland Kitchen esomeprazole (NEXIUM) 40 MG capsule Take 40 mg by mouth daily before breakfast.        . ezetimibe (ZETIA) 10 MG tablet Take 10 mg by mouth daily.        . furosemide (LASIX) 20 MG tablet Take 1 tablet (20 mg total) by mouth daily.  90 tablet  3  . HYDROcodone-acetaminophen (VICODIN) 5-500 MG per tablet Take 1 tablet by mouth every 6 (six) hours as needed.      Marland Kitchen levothyroxine (SYNTHROID, LEVOTHROID) 125 MCG tablet Take 125 mcg by mouth daily.        . metoprolol tartrate (LOPRESSOR) 50 MG tablet Take one and one-half tablet by mouth twice a day  270 tablet  3  . Multiple Vitamin (MULTIVITAMIN) capsule Take 1 capsule by mouth daily.        . pioglitazone (ACTOS) 15 MG tablet Take 15 mg by mouth daily.        . potassium chloride SA (K-DUR,KLOR-CON) 10 MEQ tablet Take 1 tablet (10 mEq total) by mouth daily.  90 tablet  3  . rOPINIRole (REQUIP) 3 MG tablet Take 3 mg by mouth at bedtime.        . traMADol (ULTRAM) 50 MG tablet Take 50 mg by mouth every 6 (six) hours as needed. Maximum dose= 8 tablets per day         Review of Systems Review of Systems  Constitutional: Negative for fever, chills and unexpected weight change.  HENT: Negative for hearing loss, congestion, sore throat, trouble swallowing and voice change.   Eyes: Negative for visual disturbance.  Respiratory: Negative for cough and wheezing.   Cardiovascular: Positive for leg swelling. Negative for chest pain and palpitations.  Gastrointestinal: Positive for diarrhea. Negative for nausea, vomiting,  abdominal pain, constipation, blood in stool, abdominal distention and anal bleeding.  Genitourinary: Negative for hematuria, vaginal bleeding and difficulty urinating.  Musculoskeletal: Negative for arthralgias.  Skin: Negative for rash and wound.  Neurological: Negative for seizures, syncope and headaches.  Hematological: Negative for adenopathy. Bruises/bleeds easily.  Psychiatric/Behavioral: Negative for confusion.   Menarche - age 55 1st pregnancy - 32 Breastfeed - no Hysterectomy - 1972 Premarin for many years  Blood pressure 130/82, pulse 78, temperature 98.6 F (37 C), temperature source Temporal, resp. rate 18, height 5\' 4"  (1.626 m), weight 193 lb (87.544 kg).  Physical Exam Physical Exam WDWN in NAD HEENT - EOMI, sclera anicteric Lungs - cTA B CV - RRR Abd - soft, nt Breasts - no palpable masses in the right breast, no axillary lymphadenopathy Left breast - significant bruising in lower breast; no palpable masses, no axillary lymphadenopathy  Data Reviewed Mammogram/ U/S/ MRI  Assessment    Left infiltrating ductal carcinoma with surrounding calcifications/DCIS     Plan    We spent a significant amount of time (40 min) with the patient and her husband and son, discussing the surgical options.  She would be a good candidate for breast conservation, with bracketing of the microcalcifications by needle-localization, as well as sentinel lymph node biopsy, followed by radiation and possible hormone or other adjuvant therapy.  We will obtain final cardiac clearance from Dr. Riley Kill.  She would like to have breast conservation, so we will go ahead and tentatively schedule that surgery.  The surgical procedure has been discussed with the patient.  Potential risks, benefits, alternative treatments, and expected outcomes have been explained.  All of the patient's questions at this time have been answered.  The likelihood of reaching the patient's treatment goal is good.  The  patient understand the proposed surgical procedure and wishes to proceed.        Aleksandar Duve K. 11/01/2011, 11:59 AM

## 2011-11-01 NOTE — Telephone Encounter (Signed)
New problem:  Per Pattricia Boss , status of cardiac clearance.

## 2011-11-01 NOTE — Telephone Encounter (Signed)
I spoke with Dr Riley Kill and he gave a verbal order that the pt can hold ASA 5 days prior to lumpectomy. I spoke with Pattricia Boss and made her aware of this information.

## 2011-11-01 NOTE — Telephone Encounter (Signed)
Lumpectomy--pt needs to hold ASA 5 days prior to procedure.

## 2011-11-01 NOTE — Progress Notes (Signed)
Pt saw dr Riley Kill 10/31/11 for cardiac clearence-had ekg-bmet

## 2011-11-02 ENCOUNTER — Other Ambulatory Visit (INDEPENDENT_AMBULATORY_CARE_PROVIDER_SITE_OTHER): Payer: Self-pay | Admitting: Surgery

## 2011-11-02 ENCOUNTER — Telehealth (INDEPENDENT_AMBULATORY_CARE_PROVIDER_SITE_OTHER): Payer: Self-pay | Admitting: General Surgery

## 2011-11-02 ENCOUNTER — Encounter (HOSPITAL_COMMUNITY): Payer: Medicare Other

## 2011-11-02 ENCOUNTER — Ambulatory Visit (HOSPITAL_COMMUNITY): Payer: Medicare Other

## 2011-11-02 DIAGNOSIS — R921 Mammographic calcification found on diagnostic imaging of breast: Secondary | ICD-10-CM

## 2011-11-02 DIAGNOSIS — C50912 Malignant neoplasm of unspecified site of left female breast: Secondary | ICD-10-CM

## 2011-11-02 NOTE — Assessment & Plan Note (Signed)
Planning for surgery.  Has had CABG.  No extensive evaluation required.  A 2 D echo would be helpful if surgery is planned.

## 2011-11-02 NOTE — Progress Notes (Addendum)
Cardiac Rehabilitation Program Progress Report   Orientation:  07/14/2011 Graduate Date:  Discharge Date:  11/02/2011 # of sessions completed: 34  Cardiologist: Tonette Bihari MD:  Hassel Neth Time:  1315  A.  Exercise Program:  Tolerates exercise @ 3.2 METS for 30 minutes, Improved functional capacity  6.33 %, Improved  muscular strength  12.5 %, Decreased  flexibility -6.77 % and Discharged to home exercise program.  Anticipated compliance:  poor  B.  Mental Health:   C.  Education/Instruction/Skills  Accurately checks own pulse, Knows THR for exercise, Uses Perceived Exertion Scale and Attended 7 education classes    D.  Nutrition/Weight Control/Body Composition:  % Body Fat  46.9 and Patient has gained 2.4 kg BMI 32.9  *This section completed by Mickle Plumb, Andres Shad, RD, LDN, CDE  E.  Blood Lipids    No results found for this basename: CHOL     No results found for this basename: TRIG     No results found for this basename: HDL     No results found for this basename: CHOLHDL     No results found for this basename: LDLDIRECT      F.  Lifestyle Changes:    G.  Symptoms noted with exercise:  Musculoskeletal problems, Fatigue, Resting hypertension and Exertional hypertension  Report Completed By:  Dayton Martes   Comments:  Pt informed us in person on 11/02/11 at 1345 that she is dropping from the program due to medical issues. Pt given discharge information. Electronically signed by Harriett Sine MS on Wednesday October 29 2011 at 1555       Pt dropped out of cardiac rehab program early due to medical issues with pending surgery. Unable to complete post rehab assessments, med reconciliation.  Pt states she does have positive outlook about her new diagnosis and feels hopeful for her future.  Pt denies depression. Pt had good participation in exercise and education classes when able.  Pt was making positive lifestyle changes and  should be encouraged to continue. VSS.  Telemetry-normal sinus rhythm.

## 2011-11-02 NOTE — Telephone Encounter (Signed)
Called Amy to cancel pt surgery on Friday

## 2011-11-02 NOTE — Telephone Encounter (Signed)
Pt has appt at the [facility] on 11-04-11 at 10:00, pt is aware of the appt. And the right orders are in

## 2011-11-03 ENCOUNTER — Ambulatory Visit: Payer: Medicare Other | Admitting: Cardiology

## 2011-11-04 ENCOUNTER — Ambulatory Visit (HOSPITAL_BASED_OUTPATIENT_CLINIC_OR_DEPARTMENT_OTHER): Admission: RE | Admit: 2011-11-04 | Payer: Medicare Other | Source: Ambulatory Visit | Admitting: Surgery

## 2011-11-04 ENCOUNTER — Encounter (HOSPITAL_BASED_OUTPATIENT_CLINIC_OR_DEPARTMENT_OTHER): Admission: RE | Payer: Self-pay | Source: Ambulatory Visit

## 2011-11-04 ENCOUNTER — Ambulatory Visit (HOSPITAL_COMMUNITY): Payer: Medicare Other

## 2011-11-04 ENCOUNTER — Ambulatory Visit
Admission: RE | Admit: 2011-11-04 | Discharge: 2011-11-04 | Disposition: A | Discharge status: Home / Self Care | Payer: Medicare Other | Source: Ambulatory Visit | Attending: Surgery | Admitting: Surgery

## 2011-11-04 ENCOUNTER — Encounter (HOSPITAL_COMMUNITY): Payer: Medicare Other

## 2011-11-04 ENCOUNTER — Other Ambulatory Visit (HOSPITAL_COMMUNITY): Payer: Medicare Other

## 2011-11-04 DIAGNOSIS — D249 Benign neoplasm of unspecified breast: Secondary | ICD-10-CM | POA: Diagnosis not present

## 2011-11-04 DIAGNOSIS — D0590 Unspecified type of carcinoma in situ of unspecified breast: Secondary | ICD-10-CM | POA: Diagnosis not present

## 2011-11-04 DIAGNOSIS — R921 Mammographic calcification found on diagnostic imaging of breast: Secondary | ICD-10-CM

## 2011-11-04 DIAGNOSIS — C50912 Malignant neoplasm of unspecified site of left female breast: Secondary | ICD-10-CM

## 2011-11-04 DIAGNOSIS — N6489 Other specified disorders of breast: Secondary | ICD-10-CM | POA: Diagnosis not present

## 2011-11-04 HISTORY — DX: Gastro-esophageal reflux disease without esophagitis: K21.9

## 2011-11-04 HISTORY — PX: BREAST BIOPSY: SHX20

## 2011-11-04 HISTORY — DX: Psoriasis, unspecified: L40.9

## 2011-11-04 HISTORY — PX: OTHER SURGICAL HISTORY: SHX169

## 2011-11-04 SURGERY — BREAST LUMPECTOMY WITH NEEDLE LOCALIZATION AND AXILLARY SENTINEL LYMPH NODE BX
Anesthesia: General | Laterality: Left

## 2011-11-04 NOTE — Progress Notes (Signed)
Cardiac Rehabilitation Program Nutrition Progress Report Orientation: 07/14/2011  Graduate Date:  Discharge Date: 11/02/2011  # of sessions completed: 34  Nutrition: Pt was following a step 2 TLC diet on admission.  No post-rehab nutrition data available to evaluate.

## 2011-11-07 ENCOUNTER — Ambulatory Visit (HOSPITAL_COMMUNITY): Payer: Medicare Other

## 2011-11-07 ENCOUNTER — Encounter (HOSPITAL_COMMUNITY): Payer: Medicare Other

## 2011-11-07 ENCOUNTER — Telehealth (INDEPENDENT_AMBULATORY_CARE_PROVIDER_SITE_OTHER): Payer: Self-pay

## 2011-11-07 NOTE — Telephone Encounter (Signed)
The patient called to confirm that we got the message from [facility] about her results and that we are scheduling surgery.  I told her we did get the message and I forwarded it to Dr Corliss Skains.  Our schedulers will call her.

## 2011-11-07 NOTE — Telephone Encounter (Signed)
Leigh called and stated that the patient's 2nd biopsy was benign.  The pt is aware and is ready to schedule surgery.

## 2011-11-08 ENCOUNTER — Other Ambulatory Visit (INDEPENDENT_AMBULATORY_CARE_PROVIDER_SITE_OTHER): Payer: Self-pay | Admitting: General Surgery

## 2011-11-08 DIAGNOSIS — E119 Type 2 diabetes mellitus without complications: Secondary | ICD-10-CM | POA: Diagnosis not present

## 2011-11-08 DIAGNOSIS — E039 Hypothyroidism, unspecified: Secondary | ICD-10-CM | POA: Diagnosis not present

## 2011-11-08 DIAGNOSIS — E785 Hyperlipidemia, unspecified: Secondary | ICD-10-CM | POA: Diagnosis not present

## 2011-11-08 NOTE — Telephone Encounter (Signed)
I informed surgery scheduling that the orders are already done per Dr Corliss Skains

## 2011-11-09 ENCOUNTER — Other Ambulatory Visit (INDEPENDENT_AMBULATORY_CARE_PROVIDER_SITE_OTHER): Payer: Self-pay | Admitting: Surgery

## 2011-11-09 ENCOUNTER — Ambulatory Visit (HOSPITAL_COMMUNITY): Payer: Medicare Other

## 2011-11-09 ENCOUNTER — Encounter (HOSPITAL_COMMUNITY): Payer: Medicare Other

## 2011-11-09 DIAGNOSIS — C50912 Malignant neoplasm of unspecified site of left female breast: Secondary | ICD-10-CM

## 2011-11-10 DIAGNOSIS — I10 Essential (primary) hypertension: Secondary | ICD-10-CM | POA: Diagnosis not present

## 2011-11-10 DIAGNOSIS — E785 Hyperlipidemia, unspecified: Secondary | ICD-10-CM | POA: Diagnosis not present

## 2011-11-10 DIAGNOSIS — E119 Type 2 diabetes mellitus without complications: Secondary | ICD-10-CM | POA: Diagnosis not present

## 2011-11-10 DIAGNOSIS — E039 Hypothyroidism, unspecified: Secondary | ICD-10-CM | POA: Diagnosis not present

## 2011-11-11 ENCOUNTER — Ambulatory Visit (HOSPITAL_COMMUNITY): Payer: Medicare Other

## 2011-11-11 ENCOUNTER — Encounter (HOSPITAL_COMMUNITY): Payer: Medicare Other

## 2011-11-14 ENCOUNTER — Ambulatory Visit (HOSPITAL_COMMUNITY): Payer: Medicare Other

## 2011-11-14 ENCOUNTER — Encounter (HOSPITAL_COMMUNITY): Payer: Medicare Other

## 2011-11-16 ENCOUNTER — Telehealth: Payer: Self-pay | Admitting: Cardiology

## 2011-11-16 ENCOUNTER — Encounter (HOSPITAL_COMMUNITY): Payer: Medicare Other

## 2011-11-16 ENCOUNTER — Ambulatory Visit (HOSPITAL_COMMUNITY): Payer: Medicare Other

## 2011-11-16 ENCOUNTER — Encounter (HOSPITAL_BASED_OUTPATIENT_CLINIC_OR_DEPARTMENT_OTHER): Payer: Self-pay | Admitting: *Deleted

## 2011-11-16 NOTE — Telephone Encounter (Signed)
I spoke with Dr Riley Kill by phone as he is currently out of town and he said [name] does not need echo and that she is okay to proceed with lumpectomy and biopsy as scheduled. I spoke with [place] and the [name] and made them aware of this information.

## 2011-11-16 NOTE — Telephone Encounter (Signed)
I spoke with the pt and she said [place] ([facility]) called her and said that per documentation the pt needs an Echo prior to her procedure.  I made the pt aware that Dr Riley Kill has not  Instructed me to order an Echo.  I made the pt aware that I would contact Karin Golden to clarify this information [phone]).  I reviewed the pt's 10/29/11 office note and Dr Riley Kill did document that:    Breast cancer, left breast - Shawnie Pons, MD 11/02/2011 3:53 PM Signed  Planning for surgery. Has had CABG. No extensive evaluation required. A 2 D echo would be helpful if surgery is planned.  I will contact Dr Riley Kill to clarify if this pt needs Echo prior to Lumpectomy and Biopsy.

## 2011-11-16 NOTE — Progress Notes (Signed)
surg was r/s-waiting on 2nf bx-had labs and saw dr Riley Kill. There was a note he may need another echo-she is calling today about that

## 2011-11-16 NOTE — Telephone Encounter (Signed)
Echo (2010) faxed to [facility] @ [phone] 11/16/11/KM

## 2011-11-16 NOTE — Telephone Encounter (Signed)
Please return call to patient on mobile # [phone]   Patient has questions about med treatment prior to upcoming surgery. She can be reached at [phone]

## 2011-11-18 ENCOUNTER — Encounter (HOSPITAL_COMMUNITY): Payer: Medicare Other

## 2011-11-18 ENCOUNTER — Ambulatory Visit (HOSPITAL_COMMUNITY): Payer: Medicare Other

## 2011-11-21 ENCOUNTER — Ambulatory Visit
Admit: 2011-11-21 | Discharge: 2011-11-21 | Disposition: A | Discharge status: Home / Self Care | Payer: Medicare Other | Attending: Surgery | Admitting: Surgery

## 2011-11-21 ENCOUNTER — Ambulatory Visit (HOSPITAL_COMMUNITY)
Admission: RE | Admit: 2011-11-21 | Discharge: 2011-11-21 | Disposition: A | Discharge status: Home / Self Care | Payer: Medicare Other | Source: Ambulatory Visit | Attending: Surgery | Admitting: Surgery

## 2011-11-21 ENCOUNTER — Ambulatory Visit
Admission: RE | Admit: 2011-11-21 | Discharge: 2011-11-21 | Disposition: A | Discharge status: Home / Self Care | Payer: Medicare Other | Source: Ambulatory Visit | Attending: Surgery | Admitting: Surgery

## 2011-11-21 ENCOUNTER — Encounter (HOSPITAL_BASED_OUTPATIENT_CLINIC_OR_DEPARTMENT_OTHER)
Admission: RE | Disposition: A | Discharge status: Home / Self Care | Payer: Self-pay | Source: Ambulatory Visit | Attending: Surgery

## 2011-11-21 ENCOUNTER — Encounter (HOSPITAL_BASED_OUTPATIENT_CLINIC_OR_DEPARTMENT_OTHER): Payer: Self-pay | Admitting: Anesthesiology

## 2011-11-21 ENCOUNTER — Encounter (HOSPITAL_BASED_OUTPATIENT_CLINIC_OR_DEPARTMENT_OTHER): Payer: Self-pay | Admitting: *Deleted

## 2011-11-21 ENCOUNTER — Ambulatory Visit (HOSPITAL_BASED_OUTPATIENT_CLINIC_OR_DEPARTMENT_OTHER): Payer: Medicare Other | Admitting: Anesthesiology

## 2011-11-21 ENCOUNTER — Ambulatory Visit (HOSPITAL_BASED_OUTPATIENT_CLINIC_OR_DEPARTMENT_OTHER)
Admission: RE | Admit: 2011-11-21 | Discharge: 2011-11-21 | Disposition: A | Discharge status: Home / Self Care | Payer: Medicare Other | Source: Ambulatory Visit | Attending: Surgery | Admitting: Surgery

## 2011-11-21 DIAGNOSIS — I10 Essential (primary) hypertension: Secondary | ICD-10-CM | POA: Diagnosis not present

## 2011-11-21 DIAGNOSIS — C50912 Malignant neoplasm of unspecified site of left female breast: Secondary | ICD-10-CM

## 2011-11-21 DIAGNOSIS — K219 Gastro-esophageal reflux disease without esophagitis: Secondary | ICD-10-CM | POA: Diagnosis not present

## 2011-11-21 DIAGNOSIS — I2510 Atherosclerotic heart disease of native coronary artery without angina pectoris: Secondary | ICD-10-CM | POA: Diagnosis not present

## 2011-11-21 DIAGNOSIS — C50919 Malignant neoplasm of unspecified site of unspecified female breast: Secondary | ICD-10-CM | POA: Insufficient documentation

## 2011-11-21 DIAGNOSIS — D0590 Unspecified type of carcinoma in situ of unspecified breast: Secondary | ICD-10-CM | POA: Diagnosis not present

## 2011-11-21 DIAGNOSIS — E785 Hyperlipidemia, unspecified: Secondary | ICD-10-CM | POA: Insufficient documentation

## 2011-11-21 DIAGNOSIS — E039 Hypothyroidism, unspecified: Secondary | ICD-10-CM | POA: Insufficient documentation

## 2011-11-21 DIAGNOSIS — E119 Type 2 diabetes mellitus without complications: Secondary | ICD-10-CM | POA: Insufficient documentation

## 2011-11-21 DIAGNOSIS — I499 Cardiac arrhythmia, unspecified: Secondary | ICD-10-CM | POA: Insufficient documentation

## 2011-11-21 DIAGNOSIS — G2581 Restless legs syndrome: Secondary | ICD-10-CM | POA: Diagnosis not present

## 2011-11-21 DIAGNOSIS — R921 Mammographic calcification found on diagnostic imaging of breast: Secondary | ICD-10-CM

## 2011-11-21 LAB — POCT I-STAT, CHEM 8
BUN: 14 mg/dL (ref 6–23)
Calcium, Ion: 1.17 mmol/L (ref 1.12–1.32)
Hemoglobin: 15.3 g/dL — ABNORMAL HIGH (ref 12.0–15.0)
Sodium: 142 mEq/L (ref 135–145)
TCO2: 24 mmol/L (ref 0–100)

## 2011-11-21 LAB — GLUCOSE, CAPILLARY: Glucose-Capillary: 80 mg/dL (ref 70–99)

## 2011-11-21 SURGERY — BREAST LUMPECTOMY WITH NEEDLE LOCALIZATION AND AXILLARY SENTINEL LYMPH NODE BX
Anesthesia: General | Site: Breast | Laterality: Left | Wound class: Clean

## 2011-11-21 MED ORDER — OXYCODONE-ACETAMINOPHEN 5-325 MG PO TABS: 1.0000 | ORAL_TABLET | ORAL | Status: DC | PRN

## 2011-11-21 MED ORDER — LACTATED RINGERS IV SOLN
INTRAVENOUS | Status: DC
  Administered 2011-11-21 (×2): via INTRAVENOUS

## 2011-11-21 MED ORDER — HYDROMORPHONE HCL PF 1 MG/ML IJ SOLN
0.2500 mg | INTRAMUSCULAR | Status: DC | PRN
  Administered 2011-11-21 (×2): 0.5 mg via INTRAVENOUS

## 2011-11-21 MED ORDER — SODIUM CHLORIDE 0.9 % IJ SOLN
INTRAMUSCULAR | Status: DC | PRN
  Administered 2011-11-21: 3 mL via INTRAVENOUS

## 2011-11-21 MED ORDER — METHYLENE BLUE 1 % INJ SOLN
INTRAMUSCULAR | Status: DC | PRN
  Administered 2011-11-21: 2 mL via INTRADERMAL

## 2011-11-21 MED ORDER — TECHNETIUM TC 99M SULFUR COLLOID FILTERED
1.0000 | Freq: Once | INTRAVENOUS | Status: AC | PRN
  Administered 2011-11-21: 1 via INTRADERMAL

## 2011-11-21 MED ORDER — LIDOCAINE HCL (CARDIAC) 20 MG/ML IV SOLN
INTRAVENOUS | Status: DC | PRN
  Administered 2011-11-21: 60 mg via INTRAVENOUS

## 2011-11-21 MED ORDER — ONDANSETRON HCL 4 MG/2ML IJ SOLN: 4.0000 mg | INTRAMUSCULAR | Status: DC | PRN

## 2011-11-21 MED ORDER — PROPOFOL 10 MG/ML IV EMUL
INTRAVENOUS | Status: DC | PRN
  Administered 2011-11-21: 150 mg via INTRAVENOUS

## 2011-11-21 MED ORDER — CEFAZOLIN SODIUM-DEXTROSE 2-3 GM-% IV SOLR
2.0000 g | INTRAVENOUS | Status: AC
  Administered 2011-11-21: 2 g via INTRAVENOUS

## 2011-11-21 MED ORDER — OXYCODONE-ACETAMINOPHEN 5-325 MG PO TABS: 1.0000 | ORAL_TABLET | ORAL | Status: AC | PRN

## 2011-11-21 MED ORDER — ONDANSETRON HCL 4 MG/2ML IJ SOLN
INTRAMUSCULAR | Status: DC | PRN
  Administered 2011-11-21: 4 mg via INTRAVENOUS

## 2011-11-21 MED ORDER — BUPIVACAINE-EPINEPHRINE 0.25% -1:200000 IJ SOLN
INTRAMUSCULAR | Status: DC | PRN
  Administered 2011-11-21: 20 mL

## 2011-11-21 MED ORDER — LACTATED RINGERS IV SOLN: INTRAVENOUS | Status: DC

## 2011-11-21 MED ORDER — MORPHINE SULFATE 2 MG/ML IJ SOLN: 2.0000 mg | INTRAMUSCULAR | Status: DC | PRN

## 2011-11-21 MED ORDER — FENTANYL CITRATE 0.05 MG/ML IJ SOLN
50.0000 ug | INTRAMUSCULAR | Status: DC | PRN
  Administered 2011-11-21: 100 ug via INTRAVENOUS

## 2011-11-21 MED ORDER — FENTANYL CITRATE 0.05 MG/ML IJ SOLN
INTRAMUSCULAR | Status: DC | PRN
  Administered 2011-11-21 (×3): 50 ug via INTRAVENOUS

## 2011-11-21 MED ORDER — LORAZEPAM 2 MG/ML IJ SOLN: 1.0000 mg | Freq: Once | INTRAMUSCULAR | Status: DC | PRN

## 2011-11-21 MED ORDER — MIDAZOLAM HCL 2 MG/2ML IJ SOLN
1.0000 mg | INTRAMUSCULAR | Status: DC | PRN
  Administered 2011-11-21: 2 mg via INTRAVENOUS

## 2011-11-21 SURGICAL SUPPLY — 59 items
APL SKNCLS STERI-STRIP NONHPOA (GAUZE/BANDAGES/DRESSINGS) ×1
APPLIER CLIP 9.375 MED OPEN (MISCELLANEOUS)
APR CLP MED 9.3 20 MLT OPN (MISCELLANEOUS)
BENZOIN TINCTURE PRP APPL 2/3 (GAUZE/BANDAGES/DRESSINGS) ×2 IMPLANT
BLADE HEX COATED 2.75 (ELECTRODE) ×2 IMPLANT
BLADE SURG 10 STRL SS (BLADE) ×2 IMPLANT
BLADE SURG 15 STRL LF DISP TIS (BLADE) ×1 IMPLANT
BLADE SURG 15 STRL SS (BLADE) ×2
BLADE SURG ROTATE 9660 (MISCELLANEOUS) IMPLANT
BNDG COHESIVE 3X5 TAN STRL LF (GAUZE/BANDAGES/DRESSINGS) IMPLANT
CANISTER SUCTION 1200CC (MISCELLANEOUS) ×2 IMPLANT
CHLORAPREP W/TINT 26ML (MISCELLANEOUS) ×2 IMPLANT
CLIP APPLIE 9.375 MED OPEN (MISCELLANEOUS) IMPLANT
CLOTH BEACON ORANGE TIMEOUT ST (SAFETY) ×2 IMPLANT
COVER MAYO STAND STRL (DRAPES) ×2 IMPLANT
COVER PROBE W GEL 5X96 (DRAPES) ×2 IMPLANT
COVER TABLE BACK 60X90 (DRAPES) ×2 IMPLANT
DECANTER SPIKE VIAL GLASS SM (MISCELLANEOUS) IMPLANT
DEVICE DUBIN W/COMP PLATE 8390 (MISCELLANEOUS) ×2 IMPLANT
DRAIN CHANNEL 19F RND (DRAIN) IMPLANT
DRAIN HEMOVAC 1/8 X 5 (WOUND CARE) IMPLANT
DRAPE LAPAROSCOPIC ABDOMINAL (DRAPES) ×2 IMPLANT
DRAPE UTILITY XL STRL (DRAPES) ×2 IMPLANT
DRSG TEGADERM 2-3/8X2-3/4 SM (GAUZE/BANDAGES/DRESSINGS) ×1 IMPLANT
DRSG TEGADERM 4X4.75 (GAUZE/BANDAGES/DRESSINGS) ×4 IMPLANT
ELECT REM PT RETURN 9FT ADLT (ELECTROSURGICAL) ×2
ELECTRODE REM PT RTRN 9FT ADLT (ELECTROSURGICAL) ×1 IMPLANT
EVACUATOR SILICONE 100CC (DRAIN) IMPLANT
GAUZE SPONGE 4X4 12PLY STRL LF (GAUZE/BANDAGES/DRESSINGS) ×1 IMPLANT
GLOVE BIO SURGEON STRL SZ7 (GLOVE) ×2 IMPLANT
GLOVE ECLIPSE 6.5 STRL STRAW (GLOVE) ×1 IMPLANT
GOWN PREVENTION PLUS XLARGE (GOWN DISPOSABLE) ×3 IMPLANT
KIT MARKER MARGIN INK (KITS) IMPLANT
NDL HYPO 25X1 1.5 SAFETY (NEEDLE) ×2 IMPLANT
NDL SAFETY ECLIPSE 18X1.5 (NEEDLE) ×1 IMPLANT
NEEDLE HYPO 18GX1.5 SHARP (NEEDLE) ×2
NEEDLE HYPO 25X1 1.5 SAFETY (NEEDLE) ×4 IMPLANT
NS IRRIG 1000ML POUR BTL (IV SOLUTION) ×1 IMPLANT
PACK BASIN DAY SURGERY FS (CUSTOM PROCEDURE TRAY) ×2 IMPLANT
PENCIL BUTTON HOLSTER BLD 10FT (ELECTRODE) ×2 IMPLANT
PIN SAFETY STERILE (MISCELLANEOUS) ×1 IMPLANT
SHEET MEDIUM DRAPE 40X70 STRL (DRAPES) IMPLANT
SLEEVE SCD COMPRESS KNEE MED (MISCELLANEOUS) ×1 IMPLANT
SPONGE LAP 18X18 X RAY DECT (DISPOSABLE) IMPLANT
SPONGE LAP 4X18 X RAY DECT (DISPOSABLE) ×3 IMPLANT
STAPLER VISISTAT 35W (STAPLE) ×1 IMPLANT
STOCKINETTE IMPERVIOUS LG (DRAPES) IMPLANT
STRIP CLOSURE SKIN 1/2X4 (GAUZE/BANDAGES/DRESSINGS) ×2 IMPLANT
SUT MON AB 4-0 PC3 18 (SUTURE) ×4 IMPLANT
SUT SILK 2 0 SH (SUTURE) IMPLANT
SUT VIC AB 3-0 SH 27 (SUTURE) ×2
SUT VIC AB 3-0 SH 27X BRD (SUTURE) ×1 IMPLANT
SYR BULB IRRIGATION 50ML (SYRINGE) ×1 IMPLANT
SYR CONTROL 10ML LL (SYRINGE) ×4 IMPLANT
TOWEL OR 17X24 6PK STRL BLUE (TOWEL DISPOSABLE) ×4 IMPLANT
TOWEL OR NON WOVEN STRL DISP B (DISPOSABLE) ×2 IMPLANT
TUBE CONNECTING 20X1/4 (TUBING) ×2 IMPLANT
WATER STERILE IRR 1000ML POUR (IV SOLUTION) ×1 IMPLANT
YANKAUER SUCT BULB TIP NO VENT (SUCTIONS) ×2 IMPLANT

## 2011-11-21 NOTE — Anesthesia Postprocedure Evaluation (Signed)
Anesthesia Post Note  Patient: [name]  Procedure(s) Performed: Procedure(s) (LRB): BREAST LUMPECTOMY WITH NEEDLE LOCALIZATION AND AXILLARY SENTINEL LYMPH NODE BX (Left)  Anesthesia type: general  Patient location: PACU  Post pain: Pain level controlled  Post assessment: Patient's Cardiovascular Status Stable  Last Vitals:  Filed Vitals:   11/21/11 1630  BP: 175/121  Pulse: 59  Temp:   Resp: 9    Post vital signs: Reviewed and stable  Level of consciousness: sedated  Complications: No apparent anesthesia complications

## 2011-11-21 NOTE — H&P (View-Only) (Signed)
Patient ID: [name], female   DOB: 03/16/1933, 78 y.o.   MRN: [id number]  Chief Complaint  Patient presents with  . Breast Cancer    HPI [name] is a 78 y.o. female.  Referred by Dr. Gretchen Green for new left breast cancer Primary physician - Dr. Michael Altheimer Cardiologist - Dr. Thomas Stuckey  HPI This is a 78 yo female who is s/p CABG in 10/12 by Dr. Gerhardt who has been followed for the last two years with q6 month mammograms for questionable microcalcifications in the left breast.  She underwent stereotactic biopsy which showed invasive ductal carcinoma with DCIS, lymphovascular invasion, ER+, PR+, Ki67 47%, Her-2 negative.  MRI showed a 3 mm focus of enhancement at the margin of the biopsy cavity with some scattered enhancement in the central breast.  Bracketing is recommended for lumpectomy.  Past Medical History  Diagnosis Date  . Kidney stones   . Diverticulitis of colon with perforation   . CAD (coronary artery disease)     LHC 06/14/11: LAD 80% and 80% after Dx, Dx 80% (Dx was small), oOM 80%, mOM 70% (OM was small), oRCA 70-80%, mRCA 60-70%, EF 55% - referred for CABG;   Echo 06/14/11: mild focal basal septal hypertrophy, grade 1 diast dysfxn, mild LAE, mild RVE, PASP 37.    . S/P CABG x 2 06/2011    Dr. Gerhardt (L-LAD, S-RCA)  . Carotid stenosis     dopplers 10/12: LICA 40-59%  . DM2 (diabetes mellitus, type 2)   . HTN (hypertension)   . HLD (hyperlipidemia)   . Hypothyroidism   . RLS (restless legs syndrome)     Past Surgical History  Procedure Date  . Colon resection 1977  . Arthroscopy right knee   . Abdominal hysterectomy   . Off-pump coronary artery bypass grafting x2 06/16/2011  . Vein bypass surgery 1999    No family history on file.  Social History History  Substance Use Topics  . Smoking status: Former Smoker    Quit date: 09/05/1980  . Smokeless tobacco: Not on file  . Alcohol Use: Yes    No Known Allergies  Current  Outpatient Prescriptions  Medication Sig Dispense Refill  . aspirin 81 MG tablet Take 81 mg by mouth daily.        . atorvastatin (LIPITOR) 40 MG tablet Take 40 mg by mouth daily.        . cetirizine (ZYRTEC) 10 MG tablet Take 10 mg by mouth as needed.       . cholecalciferol (VITAMIN D) 1000 UNITS tablet Take 2,000 Units by mouth daily.        . diazepam (VALIUM) 5 MG tablet Take 5 mg by mouth every 6 (six) hours as needed.       . esomeprazole (NEXIUM) 40 MG capsule Take 40 mg by mouth daily before breakfast.        . ezetimibe (ZETIA) 10 MG tablet Take 10 mg by mouth daily.        . furosemide (LASIX) 20 MG tablet Take 1 tablet (20 mg total) by mouth daily.  90 tablet  3  . HYDROcodone-acetaminophen (VICODIN) 5-500 MG per tablet Take 1 tablet by mouth every 6 (six) hours as needed.      . levothyroxine (SYNTHROID, LEVOTHROID) 125 MCG tablet Take 125 mcg by mouth daily.        . metoprolol tartrate (LOPRESSOR) 50 MG tablet Take one and one-half tablet by mouth twice a day    270 tablet  3  . Multiple Vitamin (MULTIVITAMIN) capsule Take 1 capsule by mouth daily.        . pioglitazone (ACTOS) 15 MG tablet Take 15 mg by mouth daily.        . potassium chloride SA (K-DUR,KLOR-CON) 10 MEQ tablet Take 1 tablet (10 mEq total) by mouth daily.  90 tablet  3  . rOPINIRole (REQUIP) 3 MG tablet Take 3 mg by mouth at bedtime.        . traMADol (ULTRAM) 50 MG tablet Take 50 mg by mouth every 6 (six) hours as needed. Maximum dose= 8 tablets per day         Review of Systems Review of Systems  Constitutional: Negative for fever, chills and unexpected weight change.  HENT: Negative for hearing loss, congestion, sore throat, trouble swallowing and voice change.   Eyes: Negative for visual disturbance.  Respiratory: Negative for cough and wheezing.   Cardiovascular: Positive for leg swelling. Negative for chest pain and palpitations.  Gastrointestinal: Positive for diarrhea. Negative for nausea, vomiting,  abdominal pain, constipation, blood in stool, abdominal distention and anal bleeding.  Genitourinary: Negative for hematuria, vaginal bleeding and difficulty urinating.  Musculoskeletal: Negative for arthralgias.  Skin: Negative for rash and wound.  Neurological: Negative for seizures, syncope and headaches.  Hematological: Negative for adenopathy. Bruises/bleeds easily.  Psychiatric/Behavioral: Negative for confusion.   Menarche - age 15 1st pregnancy - 32 Breastfeed - no Hysterectomy - 1972 Premarin for many years  Blood pressure 130/82, pulse 78, temperature 98.6 F (37 C), temperature source Temporal, resp. rate 18, height 5' 4" (1.626 m), weight 193 lb (87.544 kg).  Physical Exam Physical Exam WDWN in NAD HEENT - EOMI, sclera anicteric Lungs - cTA B CV - RRR Abd - soft, nt Breasts - no palpable masses in the right breast, no axillary lymphadenopathy Left breast - significant bruising in lower breast; no palpable masses, no axillary lymphadenopathy  Data Reviewed Mammogram/ U/S/ MRI  Assessment    Left infiltrating ductal carcinoma with surrounding calcifications/DCIS     Plan    We spent a significant amount of time (40 min) with the patient and her husband and son, discussing the surgical options.  She would be a good candidate for breast conservation, with bracketing of the microcalcifications by needle-localization, as well as sentinel lymph node biopsy, followed by radiation and possible hormone or other adjuvant therapy.  We will obtain final cardiac clearance from Dr. Stuckey.  She would like to have breast conservation, so we will go ahead and tentatively schedule that surgery.  The surgical procedure has been discussed with the patient.  Potential risks, benefits, alternative treatments, and expected outcomes have been explained.  All of the patient's questions at this time have been answered.  The likelihood of reaching the patient's treatment goal is good.  The  patient understand the proposed surgical procedure and wishes to proceed.        Dawayne Ohair K. 11/01/2011, 11:59 AM    

## 2011-11-21 NOTE — Transfer of Care (Signed)
Immediate Anesthesia Transfer of Care Note  Patient: [name]  Procedure(s) Performed: Procedure(s) (LRB): BREAST LUMPECTOMY WITH NEEDLE LOCALIZATION AND AXILLARY SENTINEL LYMPH NODE BX (Left)  Patient Location: PACU  Anesthesia Type: General  Level of Consciousness: awake and alert   Airway & Oxygen Therapy: Patient Spontanous Breathing and Patient connected to face mask oxygen  Post-op Assessment: Report given to PACU RN and Post -op Vital signs reviewed and stable  Post vital signs: Reviewed and stable  Complications: No apparent anesthesia complications

## 2011-11-21 NOTE — Op Note (Signed)
Pre-op Diagnosis:  Left breast cancer Postop diagnosis: Same  Procedure performed: #1 blue dye injection #2 left axillary sentinel lymph node biopsy #3 left needle localized lumpectomy  Surgeon:  Mikalyn Hermida K. Anesthesia: Gen. via LMA Indications: This is a 77 year old female who presents with some suspicious microcalcifications in her left breast on mammogram. She underwent stereotactic biopsy which showed invasive ductal carcinoma with DCIS ER positive PR positive. An MRI showed a 3 mm focus of enhancement at the margin of the biopsy cavity. She presents now for breast conserving therapy with a lumpectomy and sentinel lymph node biopsy. She was injected by radiology with technetium sulfur colloid in the holding area. A wire was previously placed by radiology.  Description of procedure:  the patient was brought to the operating room and placed in a supine position on the operating room table. After an adequate level of general anesthesia was obtained the patient's left breast and axilla were prepped with chlor prep and draped in sterile fashion. Prior to prepping we had injected intradermal methylene blue around the nipple. This area was massaged for several minutes prior to prepping.  We began with the sentinel lymph node biopsy. The neoprobe was used to identify an area of increased activity in the axilla. We made a transverse incision across the axilla. Dissection was carried down through the subcutaneous tissues into the axillary contents. We identified an initial small blue node with increased activity. This was excised with cautery and showed an ex vivo count of 300. There continued to be significant activity within the axilla. We continued dissecting into the axilla and removed a cluster of high blue lymph nodes. This showed an ex vivo count of over 2200. There was no further background activity. We sent these as sentinel lymph node #1 and sentinel lymph node #2 for examination. We irrigated  the axilla thoroughly. We packed a sponge into the wound.   We then made a transverse incision around the wire. Dissection was carried down the subcutaneous tissues with cautery. We raised skin flaps in all directions. I took a cylinder of tissue around the wire extending at least 7 cm in depth. We took this off entirely. I could palpate the tip of the wire at the end of our specimen. The specimen was oriented with a paint kit. A specimen mammogram confirmed that the biopsy clip was within the center of the specimen. This specimen was also sent for pathologic examination. The wound was inspected carefully for hemostasis. We irrigated thoroughly. Both wounds were then closed with subcutaneous 3-0 Vicryl and 4-0 subcuticular Monocryl. Benzoin and Steri-Strips were applied. The patient was then extubated and brought to the recovery room in stable condition. All sponge, initially, and needle counts are correct.  Wilmon Arms. Corliss Skains, MD, [facility]  11/21/2011 3:48 PM

## 2011-11-21 NOTE — Interval H&P Note (Signed)
History and Physical Interval Note:  11/21/2011 1:50 PM  [name]  has presented today for surgery, with the diagnosis of left breast mass   The various methods of treatment have been discussed with the patient and family. After consideration of risks, benefits and other options for treatment, the patient has consented to  Procedure(s) (LRB): BREAST LUMPECTOMY WITH NEEDLE LOCALIZATION AND AXILLARY SENTINEL LYMPH NODE BX (Left) as a surgical intervention .  The patients' history has been reviewed, patient examined, no change in status, stable for surgery.  I have reviewed the patients' chart and labs.  Questions were answered to the patient's satisfaction.     [name] K.

## 2011-11-21 NOTE — Discharge Instructions (Signed)
[facility] Office Phone Number [phone]  BREAST BIOPSY/ PARTIAL MASTECTOMY: POST OP INSTRUCTIONS  Always review your discharge instruction sheet given to you by the facility where your surgery was performed.  IF YOU HAVE DISABILITY OR FAMILY LEAVE FORMS, YOU MUST BRING THEM TO THE OFFICE FOR PROCESSING.  DO NOT GIVE THEM TO YOUR DOCTOR.  1. A prescription for pain medication may be given to you upon discharge.  Take your pain medication as prescribed, if needed.  If narcotic pain medicine is not needed, then you may take acetaminophen (Tylenol) or ibuprofen (Advil) as needed. 2. Take your usually prescribed medications unless otherwise directed 3. If you need a refill on your pain medication, please contact your pharmacy.  They will contact our office to request authorization.  Prescriptions will not be filled after 5pm or on week-ends. 4. You should eat very light the first 24 hours after surgery, such as soup, crackers, pudding, etc.  Resume your normal diet the day after surgery. 5. Most patients will experience some swelling and bruising in the breast.  Ice packs and a good support bra will help.  Swelling and bruising can take several days to resolve.  6. It is common to experience some constipation if taking pain medication after surgery.  Increasing fluid intake and taking a stool softener will usually help or prevent this problem from occurring.  A mild laxative (Milk of Magnesia or Miralax) should be taken according to package directions if there are no bowel movements after 48 hours. 7. Unless discharge instructions indicate otherwise, you may remove your bandages 48 hours after surgery, and you may shower at that time.  You will have steri-strips (small skin tapes) in place directly over the incision.  These strips should be left on the skin for 7-10 days.   8. ACTIVITIES:  You may resume regular daily activities (gradually increasing) beginning the next day.  Wearing  a good support bra or sports bra minimizes pain and swelling.  You may have sexual intercourse when it is comfortable. a. You may drive when you no longer are taking prescription pain medication, you can comfortably wear a seatbelt, and you can safely maneuver your car and apply brakes. b. RETURN TO WORK:  1-2 weeks 9. You should see your doctor in the office for a follow-up appointment approximately two weeks after your surgery.  Your doctor's nurse will typically make your follow-up appointment when she calls you with your pathology report.  Expect your pathology report 2-3 business days after your surgery.  You may call to check if you do not hear from [place] after three days. 10. OTHER INSTRUCTIONS: _______________________________________________________________________________________________ _____________________________________________________________________________________________________________________________________ _____________________________________________________________________________________________________________________________________ _____________________________________________________________________________________________________________________________________  WHEN TO CALL YOUR DOCTOR: 1. Fever over 101.0 2. Nausea and/or vomiting. 3. Extreme swelling or bruising. 4. Continued bleeding from incision. 5. Increased pain, redness, or drainage from the incision.  The clinic staff is available to answer your questions during regular business hours.  Please don't hesitate to call and ask to speak to one of the nurses for clinical concerns.  If you have a medical emergency, go to the nearest emergency room or call 911.  A surgeon from [facility] is always on call at the hospital.  For further questions, please visit [email or web address]   Call your surgeon if you experience:   1.  Fever over 101.0. 2.  Inability to urinate. 3.  Nausea and/or  vomiting. 4.  Extreme swelling or bruising at the surgical site. 5.  Continued bleeding from  the incision. 6.  Increased pain, redness or drainage from the incision. 7.  Problems related to your pain medication.    [facility]  [address] [place], [place] [address] [phone]   Post Anesthesia Home Care Instructions  Activity: Get plenty of rest for the remainder of the day. A responsible adult should stay with you for 24 hours following the procedure.  For the next 24 hours, DO NOT: -Drive a car -Advertising copywriter -Drink alcoholic beverages -Take any medication unless instructed by your physician -Make any legal decisions or sign important papers.  Meals: Start with liquid foods such as gelatin or soup. Progress to regular foods as tolerated. Avoid greasy, spicy, heavy foods. If nausea and/or vomiting occur, drink only clear liquids until the nausea and/or vomiting subsides. Call your physician if vomiting continues.  Special Instructions/Symptoms: Your throat may feel dry or sore from the anesthesia or the breathing tube placed in your throat during surgery. If this causes discomfort, gargle with warm salt water. The discomfort should disappear within 24 hours.

## 2011-11-21 NOTE — Anesthesia Procedure Notes (Signed)
Procedure Name: LMA Insertion Date/Time: 11/21/2011 2:25 PM Performed by: Caren Macadam Pre-anesthesia Checklist: Patient identified, Emergency Drugs available, Suction available and Patient being monitored Patient Re-evaluated:Patient Re-evaluated prior to inductionOxygen Delivery Method: Circle System Utilized Preoxygenation: Pre-oxygenation with 100% oxygen Intubation Type: IV induction Ventilation: Mask ventilation without difficulty LMA: LMA inserted LMA Size: 4.0 Number of attempts: 1 Airway Equipment and Method: bite block Placement Confirmation: positive ETCO2 and breath sounds checked- equal and bilateral Tube secured with: Tape Dental Injury: Teeth and Oropharynx as per pre-operative assessment

## 2011-11-21 NOTE — Anesthesia Preprocedure Evaluation (Addendum)
Anesthesia Evaluation  Patient identified by MRN, date of birth, ID band Patient awake    Reviewed: Allergy & Precautions, H&P , NPO status , Patient's Chart, lab work & pertinent test results  Airway Mallampati: II TM Distance: >3 FB Neck ROM: Limited    Dental   Pulmonary    + decreased breath sounds      Cardiovascular hypertension, + CAD + dysrhythmias     Neuro/Psych    GI/Hepatic GERD-  ,  Endo/Other  Diabetes mellitus-Hypothyroidism   Renal/GU      Musculoskeletal   Abdominal (+) + obese,   Peds  Hematology   Anesthesia Other Findings   Reproductive/Obstetrics                          Anesthesia Physical Anesthesia Plan  ASA: III  Anesthesia Plan: General   Post-op Pain Management:    Induction: Intravenous  Airway Management Planned: LMA  Additional Equipment:   Intra-op Plan:   Post-operative Plan: Extubation in OR  Informed Consent: I have reviewed the patients History and Physical, chart, labs and discussed the procedure including the risks, benefits and alternatives for the proposed anesthesia with the patient or authorized representative who has indicated his/her understanding and acceptance.     Plan Discussed with: CRNA and Surgeon  Anesthesia Plan Comments:         Anesthesia Quick Evaluation

## 2011-11-22 ENCOUNTER — Telehealth (INDEPENDENT_AMBULATORY_CARE_PROVIDER_SITE_OTHER): Payer: Self-pay | Admitting: Surgery

## 2011-11-22 HISTORY — PX: BREAST LUMPECTOMY: SHX2

## 2011-11-22 NOTE — Telephone Encounter (Signed)
Called Mrs Lindenbaum to let her know that Dr Corliss Skains or myself will call her back with the path results once it comes in

## 2011-11-24 ENCOUNTER — Other Ambulatory Visit (INDEPENDENT_AMBULATORY_CARE_PROVIDER_SITE_OTHER): Payer: Self-pay | Admitting: Surgery

## 2011-11-24 ENCOUNTER — Telehealth (INDEPENDENT_AMBULATORY_CARE_PROVIDER_SITE_OTHER): Payer: Self-pay | Admitting: General Surgery

## 2011-11-24 DIAGNOSIS — C50912 Malignant neoplasm of unspecified site of left female breast: Secondary | ICD-10-CM

## 2011-11-24 NOTE — Telephone Encounter (Signed)
I called [name] to let her know that we are still waiting on the final path results that the pathology is running more test on the breast tissue and that Dr Corliss Skains or Pattricia Boss will give her a call once the path comes in

## 2011-11-25 ENCOUNTER — Encounter: Payer: Self-pay | Admitting: *Deleted

## 2011-11-28 ENCOUNTER — Encounter: Payer: Self-pay | Admitting: Radiation Oncology

## 2011-11-28 ENCOUNTER — Ambulatory Visit
Admission: RE | Admit: 2011-11-28 | Discharge: 2011-11-28 | Disposition: A | Discharge status: Home / Self Care | Payer: Medicare Other | Source: Ambulatory Visit | Attending: Radiation Oncology | Admitting: Radiation Oncology

## 2011-11-28 VITALS — BP 155/61 | HR 62 | Temp 98.4°F | Resp 20 | Ht 64.0 in | Wt 194.8 lb

## 2011-11-28 DIAGNOSIS — C50512 Malignant neoplasm of lower-outer quadrant of left female breast: Secondary | ICD-10-CM | POA: Insufficient documentation

## 2011-11-28 DIAGNOSIS — I6529 Occlusion and stenosis of unspecified carotid artery: Secondary | ICD-10-CM | POA: Insufficient documentation

## 2011-11-28 DIAGNOSIS — E119 Type 2 diabetes mellitus without complications: Secondary | ICD-10-CM | POA: Insufficient documentation

## 2011-11-28 DIAGNOSIS — Z170 Estrogen receptor positive status [ER+]: Secondary | ICD-10-CM | POA: Diagnosis not present

## 2011-11-28 DIAGNOSIS — Z79899 Other long term (current) drug therapy: Secondary | ICD-10-CM | POA: Insufficient documentation

## 2011-11-28 DIAGNOSIS — K219 Gastro-esophageal reflux disease without esophagitis: Secondary | ICD-10-CM | POA: Insufficient documentation

## 2011-11-28 DIAGNOSIS — Z87891 Personal history of nicotine dependence: Secondary | ICD-10-CM | POA: Insufficient documentation

## 2011-11-28 DIAGNOSIS — Z7982 Long term (current) use of aspirin: Secondary | ICD-10-CM | POA: Diagnosis not present

## 2011-11-28 DIAGNOSIS — C50519 Malignant neoplasm of lower-outer quadrant of unspecified female breast: Secondary | ICD-10-CM | POA: Diagnosis not present

## 2011-11-28 DIAGNOSIS — E039 Hypothyroidism, unspecified: Secondary | ICD-10-CM | POA: Insufficient documentation

## 2011-11-28 DIAGNOSIS — C50912 Malignant neoplasm of unspecified site of left female breast: Secondary | ICD-10-CM

## 2011-11-28 DIAGNOSIS — I2510 Atherosclerotic heart disease of native coronary artery without angina pectoris: Secondary | ICD-10-CM | POA: Diagnosis not present

## 2011-11-28 DIAGNOSIS — M7989 Other specified soft tissue disorders: Secondary | ICD-10-CM | POA: Insufficient documentation

## 2011-11-28 DIAGNOSIS — Z951 Presence of aortocoronary bypass graft: Secondary | ICD-10-CM | POA: Diagnosis not present

## 2011-11-28 DIAGNOSIS — Z510 Encounter for antineoplastic radiation therapy: Secondary | ICD-10-CM | POA: Diagnosis not present

## 2011-11-28 DIAGNOSIS — M1990 Unspecified osteoarthritis, unspecified site: Secondary | ICD-10-CM | POA: Insufficient documentation

## 2011-11-28 HISTORY — DX: Malignant neoplasm of unspecified site of unspecified female breast: C50.919

## 2011-11-28 NOTE — Progress Notes (Signed)
Please see the Nurse Progress Note in the MD Initial Consult Encounter for this patient. 

## 2011-11-28 NOTE — Progress Notes (Signed)
Married, 1 son, had 1 stillbirth in her late 23's  Menarche age 78, P19, 1st pregnancy age 15, hysterectomy in 20's, Premarin x many yrs

## 2011-11-28 NOTE — Progress Notes (Signed)
[facility] Radiation Oncology NEW PATIENT EVALUATION  Name: [name] MRN: [id number]  Date: 11/28/2011  DOB: 24-Jan-1933  Status: outpatient   CC: Junious Silk, MD, MD  Wynona Luna., MD    REFERRING PHYSICIAN: Wynona Luna., MD   DIAGNOSIS: Pathologic T1 CN0M0 ER/PR positive HER-2/neu negative grade 3 invasive ductal carcinoma of the left lower outer quadrant of the breast with high-grade ductal carcinoma in situ    HISTORY OF PRESENT ILLNESS:  [name] is a 78 y.o. female who has been followed with mammography every 6 months due to calcifications in her breasts. Her most recent screening mammogram showed changes which prompted a biopsy on 10-20-11. This demonstrated lower outer quadrant ductal carcinoma in situ with invasive ductal carcinoma. LVSI was appreciated. It was ER/PR positive HER-2/neu negative. I reviewed the patient's MRI of her breast from 10/27/2011. This demonstrated post biopsy change with a 3 mm foci of enhancement in the lateral aspect and inferior aspect of the biopsy cavity. There was no evidence of multifocal multicentric or contralateral malignancy.  I spoke with Dr. Corliss Skains this morning who performed her lumpectomy and sentinel lymph node biopsy one week ago on 11/21/2011. Her margins are clear. Closest margins for in situ and invasive disease is 0.5 cm. Zero out of two sentinel lymph nodes were positive.  She has T1cN0 ER/PR positive HER-2/neu negative grade 3 invasive ductal carcinoma of the left lower outer quadrant of the breast with high-grade ductal carcinoma in situ.  She recently recovered from a coronary artery bypass surgery. She does have osteoarthritis and diabetes.  She is very independent, she participates in water aerobics. I would estimate her ECoG performance status to be 1.  PREVIOUS RADIATION THERAPY: No   PAST MEDICAL HISTORY:  has a past medical history of Diverticulitis of colon with perforation; S/P CABG x  2 (06/2011); Carotid stenosis; DM2 (diabetes mellitus, type 2); HTN (hypertension); HLD (hyperlipidemia); Hypothyroidism; RLS (restless legs syndrome); Breast cancer, left breast (11/01/2011); CAD (coronary artery disease); Kidney stones; GERD (gastroesophageal reflux disease); Psoriasis; Diabetes mellitus; and Breast cancer (10/20/11).     PAST SURGICAL HISTORY:  Past Surgical History  Procedure Date  . Colon resection 1977  . Arthroscopy right knee   . Off-pump coronary artery bypass grafting x2 06/16/2011  . Vein bypass surgery 1999  . Colon surgery     part colectomy-  . Appendectomy   . Coronary artery bypass graft 10/12  . Breast biospy 11/04/11    Breast, Left, Needle Core Biopsy 6 Oclock, Benign Breast Tissue wtih Fibrocystic  Changes: Microcalcifications Identified, No Neoplasm or Malignancy Identified  . Breast biospy 10/20/11    Breast, Left, Needle Core Biopsy LOQ: Invsive ductal Carcinoman  . Breast lumpectomy 11/22/11    Breast, Lumpectomy, Left with Sentinel Node Biopsy: Invasive High Grade Ductal Carcinoma : No lymphovascular Invasion: High  Grade Ductal Carcinoma In-Situ with Calcification and Comedo Necrosis  . Abdominal hysterectomy     age 68's     FAMILY HISTORY: family history includes Cancer in her mother and paternal uncle and Hypertension in her brother.   SOCIAL HISTORY:  reports that she quit smoking about 31 years ago. She does not have any smokeless tobacco history on file. She reports that she drinks alcohol. She reports that she does not use illicit drugs.   ALLERGIES: Review of patient's allergies indicates no known allergies.   MEDICATIONS:  Current Outpatient Prescriptions  Medication Sig Dispense Refill  . aspirin 81 MG tablet  Take 81 mg by mouth daily.        Marland Kitchen atorvastatin (LIPITOR) 40 MG tablet Take 40 mg by mouth daily.        . cetirizine (ZYRTEC) 10 MG tablet Take 10 mg by mouth as needed.       . cholecalciferol (VITAMIN D) 1000 UNITS tablet  Take 2,000 Units by mouth daily.        . diazepam (VALIUM) 5 MG tablet Take 5 mg by mouth every 6 (six) hours as needed.       Marland Kitchen esomeprazole (NEXIUM) 40 MG capsule Take 40 mg by mouth daily before breakfast.        . ezetimibe (ZETIA) 10 MG tablet Take 10 mg by mouth daily.        . furosemide (LASIX) 20 MG tablet Take 1 tablet (20 mg total) by mouth daily.  90 tablet  3  . HYDROcodone-acetaminophen (VICODIN) 5-500 MG per tablet Take 1 tablet by mouth every 6 (six) hours as needed.      Marland Kitchen levothyroxine (SYNTHROID, LEVOTHROID) 125 MCG tablet Take 125 mcg by mouth daily.        . metoprolol tartrate (LOPRESSOR) 50 MG tablet Take one and one-half tablet by mouth twice a day  270 tablet  3  . Multiple Vitamin (MULTIVITAMIN) capsule Take 1 capsule by mouth daily.        Marland Kitchen oxyCODONE-acetaminophen (PERCOCET) 5-325 MG per tablet Take 1 tablet by mouth every 4 (four) hours as needed for pain.  40 tablet  0  . pioglitazone (ACTOS) 15 MG tablet Take 15 mg by mouth daily.        . potassium chloride SA (K-DUR,KLOR-CON) 10 MEQ tablet Take 1 tablet (10 mEq total) by mouth daily.  90 tablet  3  . rOPINIRole (REQUIP) 3 MG tablet Take 3 mg by mouth at bedtime.        . traMADol (ULTRAM) 50 MG tablet Take 50 mg by mouth every 6 (six) hours as needed. Maximum dose= 8 tablets per day          REVIEW OF SYSTEMS:  Pertinent items are noted in HPI.    PHYSICAL EXAM:  height is 5\' 4"  (1.626 m) and weight is 194 lb 12.8 oz (88.361 kg). Her oral temperature is 98.4 F (36.9 C). Her blood pressure is 155/61 and her pulse is 62. Her respiration is 20.   General: Alert and oriented, in no acute distress HEENT: Head is normocephalic. Marland Kitchen Extraocular movements are intact.  Neck: Neck is supple, no palpable cervical or supraclavicular lymphadenopathy. Heart: Regular in rate and rhythm with no murmurs, rubs, or gallops. Chest: Clear to auscultation bilaterally, with no rhonchi, wheezes, or rales. Abdomen: Soft, nontender,  nondistended, with no rigidity or guarding. Extremities: No cyanosis or edema. Lymphatics: No concerning lymphadenopathy. Skin: No concerning lesions. Musculoskeletal: symmetric strength and muscle tone throughout. Neurologic: Cranial nerves II through XII are grossly intact. No obvious focalities. Speech is fluent. Coordination is intact. Psychiatric: Judgment and insight are intact. Affect is appropriate. Breasts: She has Steri-Strips over the lower outer quadrant of the left breast, healing well from her lumpectomy. There is a postsurgical ecchymosis that is recovering. There are no palpable lesions of concern in the breasts or axillary regions bilaterally.     LABORATORY DATA:  Lab Results  Component Value Date   WBC 8.1 08/11/2011   HGB 15.3* 11/21/2011   HCT 45.0 11/21/2011   MCV 86.6 08/11/2011   PLT 309.0  08/11/2011   CMP     Component Value Date/Time   NA 142 11/21/2011 1256   K 3.9 11/21/2011 1256   CL 109 11/21/2011 1256   CO2 28 10/31/2011 1315   GLUCOSE 100* 11/21/2011 1256   BUN 14 11/21/2011 1256   CREATININE 0.80 11/21/2011 1256   CALCIUM 9.6 10/31/2011 1315   PROT 7.7 06/12/2011 2022   ALBUMIN 4.1 06/12/2011 2022   AST 20 06/12/2011 2022   ALT 19 06/12/2011 2022   ALKPHOS 122* 06/12/2011 2022   BILITOT 0.6 06/12/2011 2022   GFRNONAA 88* 06/21/2011 0545   GFRAA >90 06/21/2011 0545     PATHOLOGY: As above   RADIOLOGY: As above    IMPRESSION/PLAN:  This is a delightful 78 year old woman with ER/PR positive stage I breast cancer. I spoke with her about the randomized data by Dannial Monarch al which shows a 7% improvement in local control at 10 years for women who receive radiation in addition to tamoxifen versus tamoxifen alone in the adjuvant setting.  I explained that if she does tolerate hormonal therapy for 5 years, the benefit of radiation would be small, but it is measurable according to the study. I left the decision up to her as I felt that holding radiotherapy and  taking hormonal therapy alone would be an acceptable approach as well; her prognosis is excellent either way. We spoke about the risks benefits and side effects of radiotherapy in detail. Ultimately, the patient feels more comfortable proceeding with radiotherapy. She is also going to meet with a medical oncologist to evaluate her eligibility for hormonal therapy. I have scheduled a simulation in a couple weeks, to give her more time to heal. She is enthusiastic about proceeding. I anticipate delivering 42.5 Gray in 16 fractions to her left breast.

## 2011-11-30 ENCOUNTER — Telehealth: Payer: Self-pay | Admitting: *Deleted

## 2011-11-30 NOTE — Telephone Encounter (Signed)
Left message of voice mail for pt to return my call.

## 2011-12-01 ENCOUNTER — Telehealth: Payer: Self-pay | Admitting: *Deleted

## 2011-12-01 NOTE — Telephone Encounter (Signed)
Patient returned my call.  Confirmed 12/07/11 appt w/ pt.  Mailed before appt letter to pt. Took paperwork to Med Onc for chart.  Called [place] in [facility] to make her aware and emailed Pattricia Boss at [facility].

## 2011-12-05 ENCOUNTER — Other Ambulatory Visit: Payer: Self-pay | Admitting: Oncology

## 2011-12-05 DIAGNOSIS — C50919 Malignant neoplasm of unspecified site of unspecified female breast: Secondary | ICD-10-CM

## 2011-12-07 ENCOUNTER — Encounter (INDEPENDENT_AMBULATORY_CARE_PROVIDER_SITE_OTHER): Payer: Self-pay | Admitting: Surgery

## 2011-12-07 ENCOUNTER — Ambulatory Visit (INDEPENDENT_AMBULATORY_CARE_PROVIDER_SITE_OTHER): Payer: Medicare Other | Admitting: Surgery

## 2011-12-07 ENCOUNTER — Other Ambulatory Visit (HOSPITAL_BASED_OUTPATIENT_CLINIC_OR_DEPARTMENT_OTHER): Payer: Medicare Other | Admitting: Lab

## 2011-12-07 ENCOUNTER — Ambulatory Visit: Payer: Medicare Other

## 2011-12-07 ENCOUNTER — Ambulatory Visit (HOSPITAL_BASED_OUTPATIENT_CLINIC_OR_DEPARTMENT_OTHER): Payer: Medicare Other | Admitting: Oncology

## 2011-12-07 VITALS — BP 122/68 | HR 72 | Temp 96.6°F | Resp 14 | Ht 64.0 in | Wt 194.0 lb

## 2011-12-07 VITALS — BP 136/73 | HR 58 | Temp 97.7°F | Ht 64.0 in | Wt 194.5 lb

## 2011-12-07 DIAGNOSIS — M949 Disorder of cartilage, unspecified: Secondary | ICD-10-CM | POA: Diagnosis not present

## 2011-12-07 DIAGNOSIS — C50919 Malignant neoplasm of unspecified site of unspecified female breast: Secondary | ICD-10-CM

## 2011-12-07 DIAGNOSIS — C50912 Malignant neoplasm of unspecified site of left female breast: Secondary | ICD-10-CM

## 2011-12-07 DIAGNOSIS — Z170 Estrogen receptor positive status [ER+]: Secondary | ICD-10-CM | POA: Diagnosis not present

## 2011-12-07 DIAGNOSIS — M899 Disorder of bone, unspecified: Secondary | ICD-10-CM

## 2011-12-07 DIAGNOSIS — Z09 Encounter for follow-up examination after completed treatment for conditions other than malignant neoplasm: Secondary | ICD-10-CM

## 2011-12-07 LAB — CBC WITH DIFFERENTIAL/PLATELET
BASO%: 0.9 % (ref 0.0–2.0)
EOS%: 3 % (ref 0.0–7.0)
MCH: 28.3 pg (ref 25.1–34.0)
MCHC: 33.4 g/dL (ref 31.5–36.0)
RBC: 4.66 10*6/uL (ref 3.70–5.45)
RDW: 16.5 % — ABNORMAL HIGH (ref 11.2–14.5)
lymph#: 2 10*3/uL (ref 0.9–3.3)

## 2011-12-07 LAB — COMPREHENSIVE METABOLIC PANEL
ALT: 16 U/L (ref 0–35)
CO2: 23 mEq/L (ref 19–32)
Calcium: 9.7 mg/dL (ref 8.4–10.5)
Chloride: 108 mEq/L (ref 96–112)
Creatinine, Ser: 0.75 mg/dL (ref 0.50–1.10)
Sodium: 143 mEq/L (ref 135–145)
Total Protein: 6.5 g/dL (ref 6.0–8.3)

## 2011-12-07 LAB — CANCER ANTIGEN 27.29: CA 27.29: 13 U/mL (ref 0–39)

## 2011-12-07 MED ORDER — DOXYCYCLINE HYCLATE 100 MG PO TABS: 100.0000 mg | ORAL_TABLET | Freq: Two times a day (BID) | ORAL | Status: AC

## 2011-12-07 MED ORDER — ANASTROZOLE 1 MG PO TABS: 1.0000 mg | ORAL_TABLET | Freq: Every day | ORAL | Status: AC

## 2011-12-07 NOTE — Patient Instructions (Signed)
Start antibiotics Go to the ABC class  Keep your appointment with Dr Corliss Skains I think it is OK to  Proceed with radiation therapy planning. Call back if you are any worse.     [name]  is OK to attend the ABC Class [name], MD, [facility], [place] [phone] 12/07/2011 4:43 PM

## 2011-12-07 NOTE — Progress Notes (Signed)
ID: [name]   DOB: Feb 27, 1933  MR#: [id number]  NWG#:[id number]  HISTORY OF PRESENT ILLNESS: The patient was noted to have left breast calcifications which have been followed on a every 6 month basis, most recently 10/11/2011. Bilateral diagnostic mammography on that date showed a mass in the medial left breast and a separate area of linear calcifications. The right breast was unremarkable. Ultrasound showed a simple cysts correlating with the mass seen on mammography. The area of abnormal calcifications in the lower outer quadrant was biopsied 10/20/2011 and showed ([id number]) and invasive ductal carcinoma, which appeared low to intermediate grade, and which was estrogen receptor positive at 98%, progesterone receptor positive at 54%, with no HER-2 amplification, and with an MIB-1 of 47%  On 10/27/2011 the patient underwent bilateral breast MRIs. This showed post biopsy change but also a 3 mm focus of enhancement in the lateral aspect of the biopsy cavity. There was no MRI evidence for multifocal, or contralateral malignancy or for lymph node spread. Accordingly, on 11/21/2011 the patient underwent left lumpectomy and sentinel lymph node sampling under Dr. Corliss Skains. This showed ([id number]) high-grade invasive ductal carcinoma, measuring 1.1 cm, with 0 of 2 lymph nodes negative, and ample margins. Repeat HER-2 testing again showed no amplification.  The patient has met with Dr. Dorothy Puffer who has recommended radiation. She is seeing me today to discuss systemic therapy  REVIEW OF SYSTEMS: Gen. he she did well with her surgery, but she did have a significant hematoma which took some time to resolve. She had no fever, rash, dehiscence, or unusual swelling in the armpit or left upper extremity. She has a complex medical history, but a detailed review of systems was otherwise stable.   PAST MEDICAL HISTORY: Past Medical History  Diagnosis Date  . Diverticulitis of colon with perforation   . S/P CABG  x 2 06/2011    Dr. Tyrone Sage ([facility], [facility])  . Carotid stenosis     dopplers 10/12: LICA 40-59%  . DM2 (diabetes mellitus, type 2)   . HTN (hypertension)   . HLD (hyperlipidemia)   . Hypothyroidism   . RLS (restless legs syndrome)   . Breast cancer, left breast 11/01/2011  . CAD (coronary artery disease)     LHC 06/14/11: LAD 80% and 80% after Dx, Dx 80% (Dx was small), oOM 80%, mOM 70% (OM was small), oRCA 70-80%, mRCA 60-70%, EF 55% - referred for CABG;   Echo 06/14/11: mild focal basal septal hypertrophy, grade 1 diast dysfxn, mild LAE, mild RVE, PASP 37.    . Kidney stones   . GERD (gastroesophageal reflux disease)   . Psoriasis   . Diabetes mellitus     x 5-6 yrs  . Breast cancer 10/20/11    L breast, ER/PR+, HER2 -    PAST SURGICAL HISTORY: Past Surgical History  Procedure Date  . Colon resection 1977  . Arthroscopy right knee   . Off-pump coronary artery bypass grafting x2 06/16/2011  . Vein bypass surgery 1999  . Colon surgery     part colectomy-  . Appendectomy   . Coronary artery bypass graft 10/12  . Breast biospy 11/04/11    Breast, Left, Needle Core Biopsy 6 Oclock, Benign Breast Tissue wtih Fibrocystic  Changes: Microcalcifications Identified, No Neoplasm or Malignancy Identified  . Breast biospy 10/20/11    Breast, Left, Needle Core Biopsy LOQ: Invsive ductal Carcinoman  . Breast lumpectomy 11/22/11    Breast, Lumpectomy, Left with Sentinel Node Biopsy: Invasive High Grade Ductal  Carcinoma : No lymphovascular Invasion: High  Grade Ductal Carcinoma In-Situ with Calcification and Comedo Necrosis  . Abdominal hysterectomy     age 78's    FAMILY HISTORY Family History  Problem Relation Age of Onset  . Cancer Mother     throat  . Hypertension Brother   . Cancer Paternal Uncle    the patient's father died at the age of 1 from a subarachnoid hemorrhage. The patient's mother died at the age of 54 from congestive heart failure. She had a history of throat cancer. The  patient has one brother, currently 31 years old. She had no sisters. There is no history of breast or ovarian cancer in the family  GYNECOLOGIC HISTORY: She had menarche age 61. She is GX P2, first pregnancy to term age 83. She underwent hysterectomy around age 60 to do to menorrhagia. She took Premarin as a patch for many years, discontinued about 5 years ago  SOCIAL HISTORY: She worked as a Catering manager in a Therapist, art. Her husband of 58 years, Marilu Favre (goes by [name]) Used to work for [employer]. Their son [name] lives in [place] and works for [employer]. The patient's other child was stillborn. She has no grandchildren. She belongs to the [employer] and wants me to know that on Saturdays starting in May from 8 AM to 2:30 PM they have a hot dog sale that is considered the best in [place] (actually featured in the [employer] this month)  ADVANCED DIRECTIVES:  HEALTH MAINTENANCE: History  Substance Use Topics  . Smoking status: Former Smoker -- 1.0 packs/day for 6 years    Quit date: 09/05/1980  . Smokeless tobacco: Not on file  . Alcohol Use: Yes     socially     Colonoscopy: "about 2007, no need to repeat"  PAP: s/p hysterectomy  Bone density: At the breast center 09/12/2008, showing significant osteopenia  Lipid panel: Under treatment  No Known Allergies  Current Outpatient Prescriptions  Medication Sig Dispense Refill  . aspirin 81 MG tablet Take 81 mg by mouth daily.        . cetirizine (ZYRTEC) 10 MG tablet Take 10 mg by mouth as needed.       . cholecalciferol (VITAMIN D) 1000 UNITS tablet Take 2,000 Units by mouth daily.        . diazepam (VALIUM) 5 MG tablet Take 5 mg by mouth every 6 (six) hours as needed.       Marland Kitchen esomeprazole (NEXIUM) 40 MG capsule Take 40 mg by mouth daily before breakfast.        . ezetimibe (ZETIA) 10 MG tablet Take 10 mg by mouth daily.        . furosemide (LASIX) 20 MG tablet Take 1 tablet (20 mg total) by  mouth daily.  90 tablet  3  . HYDROcodone-acetaminophen (VICODIN) 5-500 MG per tablet Take 1 tablet by mouth every 6 (six) hours as needed.      Marland Kitchen levothyroxine (SYNTHROID, LEVOTHROID) 125 MCG tablet Take 125 mcg by mouth daily.        . metoprolol tartrate (LOPRESSOR) 50 MG tablet Take one and one-half tablet by mouth twice a day  270 tablet  3  . Multiple Vitamin (MULTIVITAMIN) capsule Take 1 capsule by mouth daily.        . pioglitazone (ACTOS) 15 MG tablet Take 15 mg by mouth daily.        . potassium chloride SA (K-DUR,KLOR-CON)  10 MEQ tablet Take 1 tablet (10 mEq total) by mouth daily.  90 tablet  3  . rOPINIRole (REQUIP) 3 MG tablet Take 3 mg by mouth at bedtime.        . traMADol (ULTRAM) 50 MG tablet Take 50 mg by mouth every 6 (six) hours as needed. Maximum dose= 8 tablets per day       . CRESTOR 20 MG tablet         OBJECTIVE: Middle-aged white woman in no acute distress Filed Vitals:   12/07/11 0924  BP: 136/73  Pulse: 58  Temp: 97.7 F (36.5 C)     Body mass index is 33.39 kg/(m^2).    ECOG FS: 1  Sclerae unicteric Oropharynx clear No peripheral adenopathy Lungs no rales or rhonchi Heart regular rate and rhythm, no murmur appreciated Abd benign MSK no focal spinal tenderness, no peripheral edema Neuro: nonfocal Breasts: Right breast no suspicious findings; left breast status post recent lumpectomy; there is a minimal blotch in the lower aspect of the breast, without frank erythema, swelling, or evidence of cellulitis  LAB RESULTS: Lab Results  Component Value Date   WBC 6.4 12/07/2011   NEUTROABS 3.7 12/07/2011   HGB 13.2 12/07/2011   HCT 39.5 12/07/2011   MCV 84.6 12/07/2011   PLT 276 12/07/2011      Chemistry      Component Value Date/Time   NA 142 11/21/2011 1256   K 3.9 11/21/2011 1256   CL 109 11/21/2011 1256   CO2 28 10/31/2011 1315   BUN 14 11/21/2011 1256   CREATININE 0.80 11/21/2011 1256      Component Value Date/Time   CALCIUM 9.6 10/31/2011 1315   ALKPHOS  122* 06/12/2011 2022   AST 20 06/12/2011 2022   ALT 19 06/12/2011 2022   BILITOT 0.6 06/12/2011 2022       No results found for this basename: LABCA2    No components found with this basename: [id number]    No results found for this basename: INR:1;PROTIME:1 in the last 168 hours  Urinalysis    Component Value Date/Time   COLORURINE YELLOW 06/15/2011 0810   APPEARANCEUR CLOUDY* 06/15/2011 0810   LABSPEC 1.031* 06/15/2011 0810   PHURINE 5.0 06/15/2011 0810   GLUCOSEU NEGATIVE 06/15/2011 0810   HGBUR NEGATIVE 06/15/2011 0810   BILIRUBINUR NEGATIVE 06/15/2011 0810   KETONESUR NEGATIVE 06/15/2011 0810   PROTEINUR NEGATIVE 06/15/2011 0810   UROBILINOGEN 0.2 06/15/2011 0810   NITRITE NEGATIVE 06/15/2011 0810   LEUKOCYTESUR MODERATE* 06/15/2011 0810    STUDIES:  11/21/2011  *RADIOLOGY REPORT*  Clinical Data:  DCIS, left breast  NEEDLE LOCALIZATION WITH MAMMOGRAPHIC GUIDANCE AND SPECIMEN RADIOGRAPH  Patient presents for needle localization prior to left lumpectomy. I met with the patient and we discussed the procedure of needle localization including benefits and alternatives. We discussed the high likelihood of a successful procedure. We discussed the risks of the procedure, including infection, bleeding, tissue injury, and further surgery. Informed, written consent was given.  Using mammographic guidance, sterile technique, 2% lidocaine and a 7 cm modified Kopans needle, the clip localized using a lateral approach.  The films are marked for Dr. Corliss Skains.  Specimen radiograph was performed at [facility] , and confirms the clip is present in the tissue sample.  The specimen is marked for pathology.  IMPRESSION: Needle localization left breast.  No apparent complications.  Original Report Authenticated By: Hiram Gash, M.D.    ASSESSMENT: 78 year old [place] woman, status post left lumpectomy  and sentinel lymph node sampling 11/21/2011 for a T1 C. N0, stage IA invasive ductal carcinoma,  grade 3, strongly estrogen and progesterone receptor positive, with an MIB-1 47%, and no HER-2 amplification.  PLAN: She understands that women after 70 may forego radiation if they take anti-estrogens for 5 years, however she would like to receive radiation and that is being operationalized. Because of multiple competing causes of death, her benefit from anti-estrogen therapy is likely to be in the 4% range. She would gad essentially no benefit from chemotherapy.  We then discussed anti-estrogens in detail. Given her cardiac history I would prefer to stay away from tamoxifen. Accordingly we will try anastrozole. She has a good understanding of the possible toxicities side effects and complications of this medication. She will started at the end of radiation and see me approximately 2 months later. Before that visit she will have a repeat bone density.  She understands she aleady has significant osteopenia, and if we do go on aromatase inhibitors she will benefit from zoledronic acid yearly, in addition to a daily walking program, and optimization of her vitamin D and calcium intake. Overall, she is at low risk for recurrence, and once she is in a stable treatment course I will start seeing her on a once a year basis.   Elice Crigger C    12/07/2011

## 2011-12-07 NOTE — Progress Notes (Signed)
[name]    [id number] 12/07/2011    [birth year]   CC: Post op lumpectomy and sln  HPI: The patient returns for post op follow-up. She underwent a Left lumpectomy and sentinel node by Dr Corliss Skains on 11/21/11. Over all she feels that she is doing well.This is her first visit and she came to the urgent office because of some redness of the breast and swelling of the nipple area. She is having no pain, no fevers and has not been tender. She is scheduled for CT simulation next week.   PE: The incision is healing nicely and there is no evidence of infection or hematoma.There is some central edema and some ecchymosis which is fairly diffuse. It is only slightly red, and not tender or warm to the touch. The incision site is clean and n evidence of infection or fluid collection    DATA REVIEWED: Pathology report showed Stage I breast cancer , 1.1 cm, neg node X 2 and neg margin.   IMPRESSION: Patient doing well. Ecchymosis but don't believe infected  PLAN:7 Her next visit will be in two weeks with Dr Corliss Skains. Although I don't think there is a current infection, I think a course of Doxy might prevent one from developing. I gave her a copy of path and discussed with her. Also recommended ABC class.

## 2011-12-08 ENCOUNTER — Encounter: Payer: Self-pay | Admitting: *Deleted

## 2011-12-08 NOTE — Progress Notes (Signed)
Mailed after appt letter to pt. 

## 2011-12-09 ENCOUNTER — Telehealth: Payer: Self-pay | Admitting: Oncology

## 2011-12-09 NOTE — Telephone Encounter (Signed)
Pt has her July 2013 appts. Scheduled the pt her bone density appt for July. Pt stated since her last bone density was over two years ago she would like to go sooner.  Pt is aware of the bone density appt for April.

## 2011-12-12 ENCOUNTER — Ambulatory Visit
Admission: RE | Admit: 2011-12-12 | Discharge: 2011-12-12 | Disposition: A | Discharge status: Home / Self Care | Payer: Medicare Other | Source: Ambulatory Visit | Attending: Radiation Oncology | Admitting: Radiation Oncology

## 2011-12-12 ENCOUNTER — Ambulatory Visit: Payer: Medicare Other | Admitting: Cardiology

## 2011-12-12 DIAGNOSIS — C50519 Malignant neoplasm of lower-outer quadrant of unspecified female breast: Secondary | ICD-10-CM

## 2011-12-12 DIAGNOSIS — M7989 Other specified soft tissue disorders: Secondary | ICD-10-CM | POA: Diagnosis not present

## 2011-12-12 DIAGNOSIS — E119 Type 2 diabetes mellitus without complications: Secondary | ICD-10-CM | POA: Diagnosis not present

## 2011-12-12 DIAGNOSIS — M1990 Unspecified osteoarthritis, unspecified site: Secondary | ICD-10-CM | POA: Diagnosis not present

## 2011-12-12 DIAGNOSIS — Z170 Estrogen receptor positive status [ER+]: Secondary | ICD-10-CM | POA: Diagnosis not present

## 2011-12-12 DIAGNOSIS — Z510 Encounter for antineoplastic radiation therapy: Secondary | ICD-10-CM | POA: Diagnosis not present

## 2011-12-12 NOTE — Progress Notes (Signed)
Simulation and treatment planning note  Diagnosis pathologic T1 C. N0 M0 ER/PR positive HER-2/neu negative left breast cancer  [name] is status post left lumpectomy. She will receive whole breast radiotherapy. The patient was laid in the supine position on the treatment table with her arms over her head. Her head was in an Accuform device. I placed adhesive wiring over her lumpectomy scar and around the borders of her left breast tissue . High-resolution CT axial imaging was obtained of the patient's chest. An isocenter was placed in her anterior left lung. Skin markings were made and she tolerated the procedure well without any complications.  Treatment planning note: the patient will be treated with opposed tangential fields using MLCs for custom blocks. I plan to prescribe 42.5 Gy in 16 fractions to the whole breast.

## 2011-12-12 NOTE — Progress Notes (Signed)
Met with patient to discuss RO billing.   Attending Rad: Dr. Paulino Rily Tx: [id number] Extrl Beam   Dx: 174.9 Female Breast, NOS (excludes skin of breast T-173.5)

## 2011-12-14 ENCOUNTER — Encounter: Payer: Self-pay | Admitting: *Deleted

## 2011-12-14 DIAGNOSIS — M1990 Unspecified osteoarthritis, unspecified site: Secondary | ICD-10-CM | POA: Diagnosis not present

## 2011-12-14 DIAGNOSIS — M7989 Other specified soft tissue disorders: Secondary | ICD-10-CM | POA: Diagnosis not present

## 2011-12-14 DIAGNOSIS — E119 Type 2 diabetes mellitus without complications: Secondary | ICD-10-CM | POA: Diagnosis not present

## 2011-12-14 DIAGNOSIS — Z170 Estrogen receptor positive status [ER+]: Secondary | ICD-10-CM | POA: Diagnosis not present

## 2011-12-14 DIAGNOSIS — Z510 Encounter for antineoplastic radiation therapy: Secondary | ICD-10-CM | POA: Diagnosis not present

## 2011-12-14 DIAGNOSIS — C50519 Malignant neoplasm of lower-outer quadrant of unspecified female breast: Secondary | ICD-10-CM | POA: Diagnosis not present

## 2011-12-14 NOTE — Progress Notes (Signed)
[facility] Psychosocial Distress Screening Clinical Social Work  Clinical Social Work was referred by distress screening protocol.  The patient scored a 5 on the Psychosocial Distress Thermometer which indicates moderate distress. Clinical Social Worker contacted to assess for distress and other psychosocial needs. The patient states she felt that her distress level is lowered after meeting with Mr. Darnelle Catalan and determining a treatment plan. She plans to start her radiation next week and has no concerns at this time. CSW explained CSW role at [facility] and encouraged patient to call with any additional questions or concerns.   Clinical Social Worker follow up needed: no  If yes, follow up plan:  Kathrin Penner, MSW, [facility] Clinical Social Worker [facility] [phone]

## 2011-12-15 ENCOUNTER — Encounter (INDEPENDENT_AMBULATORY_CARE_PROVIDER_SITE_OTHER): Payer: Self-pay | Admitting: Surgery

## 2011-12-15 ENCOUNTER — Ambulatory Visit (INDEPENDENT_AMBULATORY_CARE_PROVIDER_SITE_OTHER): Payer: Medicare Other | Admitting: Surgery

## 2011-12-15 ENCOUNTER — Encounter (INDEPENDENT_AMBULATORY_CARE_PROVIDER_SITE_OTHER): Payer: Medicare Other | Admitting: Surgery

## 2011-12-15 VITALS — BP 150/70 | HR 64 | Temp 97.2°F | Ht 64.0 in | Wt 195.6 lb

## 2011-12-15 DIAGNOSIS — H2510 Age-related nuclear cataract, unspecified eye: Secondary | ICD-10-CM | POA: Diagnosis not present

## 2011-12-15 DIAGNOSIS — E119 Type 2 diabetes mellitus without complications: Secondary | ICD-10-CM | POA: Diagnosis not present

## 2011-12-15 DIAGNOSIS — C50519 Malignant neoplasm of lower-outer quadrant of unspecified female breast: Secondary | ICD-10-CM

## 2011-12-15 NOTE — Progress Notes (Signed)
S/p left lumpectomy and sentinel lymph node biopsy on 11/04/11 for invasive ductal carcinoma - T1c N0 Mx.  She has begun Arimidex with Dr. Darnelle Catalan and is scheduled to begin radiation therapy with Dr. Basilio Cairo next week.  She is doing quite well. The slight erythema from a post-op hematoma seems to be fading.    Filed Vitals:   12/15/11 1025  BP: 150/70  Pulse: 64  Temp: 97.2 F (36.2 C)   Her incision is healing well.  The erythema is fading.  She still has some thickening of the areolar region, which is likely residual from the hematoma in the lumpectomy cavity.  Full ROM of the left shoulder.  Continue treatment as planned with Drs. Magrinat and Basilio Cairo.  Follow-up 6 months.  Wilmon Arms. Corliss Skains, MD, [facility]  12/15/2011 11:12 AM

## 2011-12-19 ENCOUNTER — Ambulatory Visit
Admission: RE | Admit: 2011-12-19 | Discharge: 2011-12-19 | Disposition: A | Discharge status: Home / Self Care | Payer: Medicare Other | Source: Ambulatory Visit | Attending: Radiation Oncology | Admitting: Radiation Oncology

## 2011-12-19 ENCOUNTER — Encounter: Payer: Self-pay | Admitting: Radiation Oncology

## 2011-12-19 DIAGNOSIS — Z170 Estrogen receptor positive status [ER+]: Secondary | ICD-10-CM | POA: Diagnosis not present

## 2011-12-19 DIAGNOSIS — E119 Type 2 diabetes mellitus without complications: Secondary | ICD-10-CM | POA: Diagnosis not present

## 2011-12-19 DIAGNOSIS — M7989 Other specified soft tissue disorders: Secondary | ICD-10-CM | POA: Diagnosis not present

## 2011-12-19 DIAGNOSIS — M1990 Unspecified osteoarthritis, unspecified site: Secondary | ICD-10-CM | POA: Diagnosis not present

## 2011-12-19 DIAGNOSIS — C50519 Malignant neoplasm of lower-outer quadrant of unspecified female breast: Secondary | ICD-10-CM | POA: Diagnosis not present

## 2011-12-19 DIAGNOSIS — Z510 Encounter for antineoplastic radiation therapy: Secondary | ICD-10-CM | POA: Diagnosis not present

## 2011-12-19 NOTE — Progress Notes (Signed)
VERIFICATION SIMULATION NOTE  NARRATIVE: The patient was laid in the correct position on the treatment table for simulation verification. Portal imaging was obtained and I verified the fields and MLCs for her left breast radiotherapy to be accurate. The patient tolerated the procedure well.

## 2011-12-20 ENCOUNTER — Ambulatory Visit
Admission: RE | Admit: 2011-12-20 | Discharge: 2011-12-20 | Disposition: A | Discharge status: Home / Self Care | Payer: Medicare Other | Source: Ambulatory Visit | Attending: Radiation Oncology | Admitting: Radiation Oncology

## 2011-12-20 ENCOUNTER — Ambulatory Visit: Payer: Medicare Other | Admitting: Cardiology

## 2011-12-20 DIAGNOSIS — E119 Type 2 diabetes mellitus without complications: Secondary | ICD-10-CM | POA: Diagnosis not present

## 2011-12-20 DIAGNOSIS — Z170 Estrogen receptor positive status [ER+]: Secondary | ICD-10-CM | POA: Diagnosis not present

## 2011-12-20 DIAGNOSIS — M7989 Other specified soft tissue disorders: Secondary | ICD-10-CM | POA: Diagnosis not present

## 2011-12-20 DIAGNOSIS — M1990 Unspecified osteoarthritis, unspecified site: Secondary | ICD-10-CM | POA: Diagnosis not present

## 2011-12-20 DIAGNOSIS — C50519 Malignant neoplasm of lower-outer quadrant of unspecified female breast: Secondary | ICD-10-CM

## 2011-12-20 DIAGNOSIS — Z510 Encounter for antineoplastic radiation therapy: Secondary | ICD-10-CM | POA: Diagnosis not present

## 2011-12-20 MED ORDER — RADIAPLEXRX EX GEL
Freq: Once | CUTANEOUS | Status: AC
  Administered 2011-12-20: 1 via TOPICAL

## 2011-12-20 MED ORDER — ALRA NON-METALLIC DEODORANT (RAD-ONC)
1.0000 "application " | Freq: Once | TOPICAL | Status: AC
  Administered 2011-12-20: 1 via TOPICAL

## 2011-12-20 NOTE — Progress Notes (Signed)
Encounter addended by: Delynn Flavin, RN on: 12/20/2011 11:04 AM<BR>     Documentation filed: Orders

## 2011-12-20 NOTE — Progress Notes (Signed)
Post simulation Education.  See note filed under Patient Education

## 2011-12-20 NOTE — Progress Notes (Signed)
Encounter addended by: Delynn Flavin, RN on: 12/20/2011 11:07 AM<BR>     Documentation filed: Inpatient MAR

## 2011-12-21 ENCOUNTER — Ambulatory Visit
Admission: RE | Admit: 2011-12-21 | Discharge: 2011-12-21 | Disposition: A | Discharge status: Home / Self Care | Payer: Medicare Other | Source: Ambulatory Visit | Attending: Oncology | Admitting: Oncology

## 2011-12-21 ENCOUNTER — Ambulatory Visit
Admission: RE | Admit: 2011-12-21 | Discharge: 2011-12-21 | Disposition: A | Discharge status: Home / Self Care | Payer: Medicare Other | Source: Ambulatory Visit | Attending: Radiation Oncology | Admitting: Radiation Oncology

## 2011-12-21 ENCOUNTER — Other Ambulatory Visit: Payer: Medicare Other

## 2011-12-21 DIAGNOSIS — C50912 Malignant neoplasm of unspecified site of left female breast: Secondary | ICD-10-CM

## 2011-12-21 DIAGNOSIS — M1990 Unspecified osteoarthritis, unspecified site: Secondary | ICD-10-CM | POA: Diagnosis not present

## 2011-12-21 DIAGNOSIS — C50519 Malignant neoplasm of lower-outer quadrant of unspecified female breast: Secondary | ICD-10-CM | POA: Diagnosis not present

## 2011-12-21 DIAGNOSIS — Z780 Asymptomatic menopausal state: Secondary | ICD-10-CM | POA: Diagnosis not present

## 2011-12-21 DIAGNOSIS — M949 Disorder of cartilage, unspecified: Secondary | ICD-10-CM | POA: Diagnosis not present

## 2011-12-21 DIAGNOSIS — Z170 Estrogen receptor positive status [ER+]: Secondary | ICD-10-CM | POA: Diagnosis not present

## 2011-12-21 DIAGNOSIS — M899 Disorder of bone, unspecified: Secondary | ICD-10-CM | POA: Diagnosis not present

## 2011-12-21 DIAGNOSIS — M7989 Other specified soft tissue disorders: Secondary | ICD-10-CM | POA: Diagnosis not present

## 2011-12-21 DIAGNOSIS — E119 Type 2 diabetes mellitus without complications: Secondary | ICD-10-CM | POA: Diagnosis not present

## 2011-12-21 DIAGNOSIS — Z510 Encounter for antineoplastic radiation therapy: Secondary | ICD-10-CM | POA: Diagnosis not present

## 2011-12-22 ENCOUNTER — Ambulatory Visit
Admission: RE | Admit: 2011-12-22 | Discharge: 2011-12-22 | Disposition: A | Discharge status: Home / Self Care | Payer: Medicare Other | Source: Ambulatory Visit | Attending: Radiation Oncology | Admitting: Radiation Oncology

## 2011-12-22 DIAGNOSIS — Z170 Estrogen receptor positive status [ER+]: Secondary | ICD-10-CM | POA: Diagnosis not present

## 2011-12-22 DIAGNOSIS — M7989 Other specified soft tissue disorders: Secondary | ICD-10-CM | POA: Diagnosis not present

## 2011-12-22 DIAGNOSIS — E119 Type 2 diabetes mellitus without complications: Secondary | ICD-10-CM | POA: Diagnosis not present

## 2011-12-22 DIAGNOSIS — E785 Hyperlipidemia, unspecified: Secondary | ICD-10-CM | POA: Diagnosis not present

## 2011-12-22 DIAGNOSIS — M1990 Unspecified osteoarthritis, unspecified site: Secondary | ICD-10-CM | POA: Diagnosis not present

## 2011-12-22 DIAGNOSIS — C50519 Malignant neoplasm of lower-outer quadrant of unspecified female breast: Secondary | ICD-10-CM | POA: Diagnosis not present

## 2011-12-22 DIAGNOSIS — Z510 Encounter for antineoplastic radiation therapy: Secondary | ICD-10-CM | POA: Diagnosis not present

## 2011-12-23 ENCOUNTER — Ambulatory Visit
Admission: RE | Admit: 2011-12-23 | Discharge: 2011-12-23 | Disposition: A | Discharge status: Home / Self Care | Payer: Medicare Other | Source: Ambulatory Visit | Attending: Radiation Oncology | Admitting: Radiation Oncology

## 2011-12-23 DIAGNOSIS — Z170 Estrogen receptor positive status [ER+]: Secondary | ICD-10-CM | POA: Diagnosis not present

## 2011-12-23 DIAGNOSIS — E119 Type 2 diabetes mellitus without complications: Secondary | ICD-10-CM | POA: Diagnosis not present

## 2011-12-23 DIAGNOSIS — C50519 Malignant neoplasm of lower-outer quadrant of unspecified female breast: Secondary | ICD-10-CM | POA: Diagnosis not present

## 2011-12-23 DIAGNOSIS — M7989 Other specified soft tissue disorders: Secondary | ICD-10-CM | POA: Diagnosis not present

## 2011-12-23 DIAGNOSIS — M1990 Unspecified osteoarthritis, unspecified site: Secondary | ICD-10-CM | POA: Diagnosis not present

## 2011-12-23 DIAGNOSIS — Z510 Encounter for antineoplastic radiation therapy: Secondary | ICD-10-CM | POA: Diagnosis not present

## 2011-12-26 ENCOUNTER — Ambulatory Visit
Admission: RE | Admit: 2011-12-26 | Discharge: 2011-12-26 | Disposition: A | Discharge status: Home / Self Care | Payer: Medicare Other | Source: Ambulatory Visit | Attending: Radiation Oncology | Admitting: Radiation Oncology

## 2011-12-26 ENCOUNTER — Encounter: Payer: Self-pay | Admitting: Radiation Oncology

## 2011-12-26 VITALS — Wt 195.0 lb

## 2011-12-26 DIAGNOSIS — Z510 Encounter for antineoplastic radiation therapy: Secondary | ICD-10-CM | POA: Diagnosis not present

## 2011-12-26 DIAGNOSIS — Z170 Estrogen receptor positive status [ER+]: Secondary | ICD-10-CM | POA: Diagnosis not present

## 2011-12-26 DIAGNOSIS — C50519 Malignant neoplasm of lower-outer quadrant of unspecified female breast: Secondary | ICD-10-CM | POA: Diagnosis not present

## 2011-12-26 DIAGNOSIS — C50919 Malignant neoplasm of unspecified site of unspecified female breast: Secondary | ICD-10-CM

## 2011-12-26 DIAGNOSIS — E119 Type 2 diabetes mellitus without complications: Secondary | ICD-10-CM | POA: Diagnosis not present

## 2011-12-26 DIAGNOSIS — M7989 Other specified soft tissue disorders: Secondary | ICD-10-CM | POA: Diagnosis not present

## 2011-12-26 DIAGNOSIS — M1990 Unspecified osteoarthritis, unspecified site: Secondary | ICD-10-CM | POA: Diagnosis not present

## 2011-12-26 NOTE — Progress Notes (Signed)
   Weekly Management Note LEFT BREAST Current Dose:  1330 cGy  Projected Dose:4256  cGy   Narrative:  The patient presents for routine under treatment assessment.  CBCT/MVCT images/Port film x-rays were reviewed.  The chart was checked. She started anastrozole on April 11. She has noted swelling in her hands and feet since then. She is going to see her cardiologist tomorrow to see if her Lasix should be adjusted. She intends to call Dr. Darnelle Catalan about this as well.  Physical Findings: Weight: 195 lb (88.451 kg). She has nonpitting swelling in her hands and fee,. Left breast continues to be erythematous consistent with how it looked postsurgically at simulation.  Impression:  The patient is tolerating radiotherapy.  Plan:  Continue radiotherapy as planned. She will discuss possible side effects from anastrozole with medical oncology.

## 2011-12-26 NOTE — Progress Notes (Signed)
Pt began Anastrozole 12/15/11. In past week she has noticed swelling of her hands, feet. Takes Lasix 20 mg daily. She states this was listed as possible side effect. She will call Dr Magrinat's nurse today to discuss. Pt has appt w/cardiologist tomorrow. Applying Radiaplex to L breast bid.

## 2011-12-27 ENCOUNTER — Encounter: Payer: Self-pay | Admitting: Cardiology

## 2011-12-27 ENCOUNTER — Ambulatory Visit
Admission: RE | Admit: 2011-12-27 | Discharge: 2011-12-27 | Disposition: A | Discharge status: Home / Self Care | Payer: Medicare Other | Source: Ambulatory Visit | Attending: Radiation Oncology | Admitting: Radiation Oncology

## 2011-12-27 ENCOUNTER — Ambulatory Visit (INDEPENDENT_AMBULATORY_CARE_PROVIDER_SITE_OTHER): Payer: Medicare Other | Admitting: Cardiology

## 2011-12-27 VITALS — BP 128/70 | HR 73 | Ht 62.0 in | Wt 197.0 lb

## 2011-12-27 DIAGNOSIS — E785 Hyperlipidemia, unspecified: Secondary | ICD-10-CM

## 2011-12-27 DIAGNOSIS — I2510 Atherosclerotic heart disease of native coronary artery without angina pectoris: Secondary | ICD-10-CM | POA: Diagnosis not present

## 2011-12-27 DIAGNOSIS — I10 Essential (primary) hypertension: Secondary | ICD-10-CM | POA: Diagnosis not present

## 2011-12-27 DIAGNOSIS — Z510 Encounter for antineoplastic radiation therapy: Secondary | ICD-10-CM | POA: Diagnosis not present

## 2011-12-27 DIAGNOSIS — M1990 Unspecified osteoarthritis, unspecified site: Secondary | ICD-10-CM | POA: Diagnosis not present

## 2011-12-27 DIAGNOSIS — E119 Type 2 diabetes mellitus without complications: Secondary | ICD-10-CM | POA: Diagnosis not present

## 2011-12-27 DIAGNOSIS — C50519 Malignant neoplasm of lower-outer quadrant of unspecified female breast: Secondary | ICD-10-CM | POA: Diagnosis not present

## 2011-12-27 DIAGNOSIS — Z170 Estrogen receptor positive status [ER+]: Secondary | ICD-10-CM | POA: Diagnosis not present

## 2011-12-27 DIAGNOSIS — M7989 Other specified soft tissue disorders: Secondary | ICD-10-CM | POA: Diagnosis not present

## 2011-12-27 NOTE — Progress Notes (Signed)
HPI:  The patient is stable.  She is undergoing 18 radiation treatments for estrogen positive breast CA.  She is also getting what anti-estrogen treatment.  So far, tolerating well.  No cardiac complaints.    Current Outpatient Prescriptions  Medication Sig Dispense Refill  . anastrozole (ARIMIDEX) 1 MG tablet Take 1 tablet (1 mg total) by mouth daily.  90 tablet  12  . aspirin 81 MG tablet Take 81 mg by mouth daily.        . cetirizine (ZYRTEC) 10 MG tablet Take 10 mg by mouth as needed.       . cholecalciferol (VITAMIN D) 1000 UNITS tablet Take 2,000 Units by mouth daily.        . CRESTOR 20 MG tablet Take 20 mg by mouth daily.       . diazepam (VALIUM) 5 MG tablet Take 5 mg by mouth every 6 (six) hours as needed.       Marland Kitchen esomeprazole (NEXIUM) 40 MG capsule Take 40 mg by mouth daily before breakfast.        . ezetimibe (ZETIA) 10 MG tablet Take 10 mg by mouth daily.        . furosemide (LASIX) 20 MG tablet Take 1 tablet (20 mg total) by mouth daily.  90 tablet  3  . HYDROcodone-acetaminophen (VICODIN) 5-500 MG per tablet Take 1 tablet by mouth every 6 (six) hours as needed.      Marland Kitchen levothyroxine (SYNTHROID, LEVOTHROID) 125 MCG tablet Take 125 mcg by mouth daily.        . metoprolol tartrate (LOPRESSOR) 50 MG tablet Take one and one-half tablet by mouth twice a day  270 tablet  3  . Multiple Vitamin (MULTIVITAMIN) capsule Take 1 capsule by mouth daily.        . NON FORMULARY daily. 15 treatments of radiation a day      . pioglitazone (ACTOS) 15 MG tablet Take 15 mg by mouth daily.        . potassium chloride SA (K-DUR,KLOR-CON) 10 MEQ tablet Take 1 tablet (10 mEq total) by mouth daily.  90 tablet  3  . rOPINIRole (REQUIP) 3 MG tablet Take 3 mg by mouth at bedtime.       . traMADol (ULTRAM) 50 MG tablet Take 50 mg by mouth every 6 (six) hours as needed. Maximum dose= 8 tablets per day         No Known Allergies  Past Medical History  Diagnosis Date  . Diverticulitis of colon with  perforation   . S/P CABG x 2 06/2011    Dr. Tyrone Sage (L[facility]LAD, [facility])  . Carotid stenosis     dopplers 10/12: LICA 40-59%  . DM2 (diabetes mellitus, type 2)   . HTN (hypertension)   . HLD (hyperlipidemia)   . Hypothyroidism   . RLS (restless legs syndrome)   . Breast cancer, left breast 11/01/2011  . CAD (coronary artery disease)     LHC 06/14/11: LAD 80% and 80% after Dx, Dx 80% (Dx was small), oOM 80%, mOM 70% (OM was small), oRCA 70-80%, mRCA 60-70%, EF 55% - referred for CABG;   Echo 06/14/11: mild focal basal septal hypertrophy, grade 1 diast dysfxn, mild LAE, mild RVE, PASP 37.    . Kidney stones   . GERD (gastroesophageal reflux disease)   . Psoriasis   . Diabetes mellitus     x 5-6 yrs  . Breast cancer 10/20/11    L breast, ER/PR+, HER2 -  Past Surgical History  Procedure Date  . Colon resection 1977  . Arthroscopy right knee   . Off-pump coronary artery bypass grafting x2 06/16/2011  . Vein bypass surgery 1999  . Colon surgery     part colectomy-  . Appendectomy   . Coronary artery bypass graft 10/12  . Breast biospy 11/04/11    Breast, Left, Needle Core Biopsy 6 Oclock, Benign Breast Tissue wtih Fibrocystic  Changes: Microcalcifications Identified, No Neoplasm or Malignancy Identified  . Breast biospy 10/20/11    Breast, Left, Needle Core Biopsy LOQ: Invsive ductal Carcinoman  . Breast lumpectomy 11/22/11    Breast, Lumpectomy, Left with Sentinel Node Biopsy: Invasive High Grade Ductal Carcinoma : No lymphovascular Invasion: High  Grade Ductal Carcinoma In-Situ with Calcification and Comedo Necrosis  . Abdominal hysterectomy     age 16's    Family History  Problem Relation Age of Onset  . Cancer Mother     throat  . Hypertension Brother   . Cancer Paternal Uncle     History   Social History  . Marital Status: Married    Spouse Name: N/A    Number of Children: N/A  . Years of Education: N/A   Occupational History  . Not on file.   Social History Main  Topics  . Smoking status: Former Smoker -- 1.0 packs/day for 6 years    Quit date: 09/05/1980  . Smokeless tobacco: Not on file  . Alcohol Use: Yes     socially  . Drug Use: No  . Sexually Active: Not on file     menarche 40, p2, 1 stillbirth, 1st preg age 32, HRT x many yrs   Other Topics Concern  . Not on file   Social History Narrative  . No narrative on file    ROS: Please see the HPI.  All other systems reviewed and negative.  PHYSICAL EXAM:  BP 128/70  Pulse 73  Ht 5\' 2"  (1.575 m)  Wt 197 lb (89.359 kg)  BMI 36.03 kg/m2  General: Well developed, well nourished, in no acute distress. Head:  Normocephalic and atraumatic. Neck: no JVD Lungs: Clear to auscultation and percussion. Heart: Normal S1 and paradoxical S2.   No murmur, rubs or gallops.  Pulses: Pulses normal in all 4 extremities. Extremities: No clubbing or cyanosis. Bilateral LE edema, R greater than L at harvest site.  Neurologic: Alert and oriented x 3.  EKG:  NSR.  LBBB.  ASSESSMENT AND PLAN:

## 2011-12-27 NOTE — Patient Instructions (Signed)
Your physician wants you to follow-up in: 6 MONTHS.  You will receive a reminder letter in the mail two months in advance. If you don't receive a letter, please call our office to schedule the follow-up appointment.  Your physician recommends that you continue on your current medications as directed. Please refer to the Current Medication list given to you today.  

## 2011-12-27 NOTE — Assessment & Plan Note (Signed)
Started on rosuvastatin by Dr. Leslie Dales.  Continue.  He will follow.

## 2011-12-27 NOTE — Assessment & Plan Note (Signed)
Well controlled 

## 2011-12-27 NOTE — Assessment & Plan Note (Signed)
Stable post CABG.  No current chest pain.  Continue to ambulate.

## 2011-12-28 ENCOUNTER — Ambulatory Visit
Admission: RE | Admit: 2011-12-28 | Discharge: 2011-12-28 | Disposition: A | Discharge status: Home / Self Care | Payer: Medicare Other | Source: Ambulatory Visit | Attending: Radiation Oncology | Admitting: Radiation Oncology

## 2011-12-28 DIAGNOSIS — E119 Type 2 diabetes mellitus without complications: Secondary | ICD-10-CM | POA: Diagnosis not present

## 2011-12-28 DIAGNOSIS — C50519 Malignant neoplasm of lower-outer quadrant of unspecified female breast: Secondary | ICD-10-CM | POA: Diagnosis not present

## 2011-12-28 DIAGNOSIS — M7989 Other specified soft tissue disorders: Secondary | ICD-10-CM | POA: Diagnosis not present

## 2011-12-28 DIAGNOSIS — Z170 Estrogen receptor positive status [ER+]: Secondary | ICD-10-CM | POA: Diagnosis not present

## 2011-12-28 DIAGNOSIS — M1990 Unspecified osteoarthritis, unspecified site: Secondary | ICD-10-CM | POA: Diagnosis not present

## 2011-12-28 DIAGNOSIS — Z510 Encounter for antineoplastic radiation therapy: Secondary | ICD-10-CM | POA: Diagnosis not present

## 2011-12-29 ENCOUNTER — Ambulatory Visit
Admission: RE | Admit: 2011-12-29 | Discharge: 2011-12-29 | Disposition: A | Discharge status: Home / Self Care | Payer: Medicare Other | Source: Ambulatory Visit | Attending: Radiation Oncology | Admitting: Radiation Oncology

## 2011-12-29 DIAGNOSIS — M1990 Unspecified osteoarthritis, unspecified site: Secondary | ICD-10-CM | POA: Diagnosis not present

## 2011-12-29 DIAGNOSIS — C50519 Malignant neoplasm of lower-outer quadrant of unspecified female breast: Secondary | ICD-10-CM | POA: Diagnosis not present

## 2011-12-29 DIAGNOSIS — E119 Type 2 diabetes mellitus without complications: Secondary | ICD-10-CM | POA: Diagnosis not present

## 2011-12-29 DIAGNOSIS — Z510 Encounter for antineoplastic radiation therapy: Secondary | ICD-10-CM | POA: Diagnosis not present

## 2011-12-29 DIAGNOSIS — Z170 Estrogen receptor positive status [ER+]: Secondary | ICD-10-CM | POA: Diagnosis not present

## 2011-12-29 DIAGNOSIS — M7989 Other specified soft tissue disorders: Secondary | ICD-10-CM | POA: Diagnosis not present

## 2011-12-30 ENCOUNTER — Ambulatory Visit
Admission: RE | Admit: 2011-12-30 | Discharge: 2011-12-30 | Disposition: A | Discharge status: Home / Self Care | Payer: Medicare Other | Source: Ambulatory Visit | Attending: Radiation Oncology | Admitting: Radiation Oncology

## 2011-12-30 DIAGNOSIS — C50519 Malignant neoplasm of lower-outer quadrant of unspecified female breast: Secondary | ICD-10-CM | POA: Diagnosis not present

## 2011-12-30 DIAGNOSIS — E119 Type 2 diabetes mellitus without complications: Secondary | ICD-10-CM | POA: Diagnosis not present

## 2011-12-30 DIAGNOSIS — Z170 Estrogen receptor positive status [ER+]: Secondary | ICD-10-CM | POA: Diagnosis not present

## 2011-12-30 DIAGNOSIS — M1990 Unspecified osteoarthritis, unspecified site: Secondary | ICD-10-CM | POA: Diagnosis not present

## 2011-12-30 DIAGNOSIS — M7989 Other specified soft tissue disorders: Secondary | ICD-10-CM | POA: Diagnosis not present

## 2011-12-30 DIAGNOSIS — Z510 Encounter for antineoplastic radiation therapy: Secondary | ICD-10-CM | POA: Diagnosis not present

## 2012-01-02 ENCOUNTER — Encounter: Payer: Self-pay | Admitting: Radiation Oncology

## 2012-01-02 ENCOUNTER — Ambulatory Visit
Admission: RE | Admit: 2012-01-02 | Discharge: 2012-01-02 | Disposition: A | Discharge status: Home / Self Care | Payer: Medicare Other | Source: Ambulatory Visit | Attending: Radiation Oncology | Admitting: Radiation Oncology

## 2012-01-02 VITALS — Wt 196.1 lb

## 2012-01-02 DIAGNOSIS — M7989 Other specified soft tissue disorders: Secondary | ICD-10-CM | POA: Diagnosis not present

## 2012-01-02 DIAGNOSIS — M1990 Unspecified osteoarthritis, unspecified site: Secondary | ICD-10-CM | POA: Diagnosis not present

## 2012-01-02 DIAGNOSIS — Z170 Estrogen receptor positive status [ER+]: Secondary | ICD-10-CM | POA: Diagnosis not present

## 2012-01-02 DIAGNOSIS — Z510 Encounter for antineoplastic radiation therapy: Secondary | ICD-10-CM | POA: Diagnosis not present

## 2012-01-02 DIAGNOSIS — E119 Type 2 diabetes mellitus without complications: Secondary | ICD-10-CM | POA: Diagnosis not present

## 2012-01-02 DIAGNOSIS — C50519 Malignant neoplasm of lower-outer quadrant of unspecified female breast: Secondary | ICD-10-CM

## 2012-01-02 NOTE — Progress Notes (Signed)
   Weekly Management Note/ L breast cancer Current Dose:  2660 cGy  Projected Dose: 4256 cGy   Narrative:  The patient presents for routine under treatment assessment.  CBCT/MVCT images/Port film x-rays were reviewed.  The chart was checked. She has no complaints today.  Physical Findings: Weight: 196 lb 1.6 oz (88.95 kg). Her left breast is erythematous, the skin is intact.  Impression:  The patient is tolerating radiotherapy.  Plan:  Continue radiotherapy as planned.

## 2012-01-02 NOTE — Progress Notes (Signed)
Patient has slight erythema left breast, skin intact, no c/o pain, radiaplex gel 2x day was putting on before treatment, discussed with patient to use after rad tx and prn ,pt gave vebal understanding, alert,oriented x3, 10/16 tx left breast completed,  11:59 AM

## 2012-01-03 ENCOUNTER — Ambulatory Visit
Admission: RE | Admit: 2012-01-03 | Discharge: 2012-01-03 | Disposition: A | Discharge status: Home / Self Care | Payer: Medicare Other | Source: Ambulatory Visit | Attending: Radiation Oncology | Admitting: Radiation Oncology

## 2012-01-03 DIAGNOSIS — C50519 Malignant neoplasm of lower-outer quadrant of unspecified female breast: Secondary | ICD-10-CM | POA: Diagnosis not present

## 2012-01-03 DIAGNOSIS — M1990 Unspecified osteoarthritis, unspecified site: Secondary | ICD-10-CM | POA: Diagnosis not present

## 2012-01-03 DIAGNOSIS — E119 Type 2 diabetes mellitus without complications: Secondary | ICD-10-CM | POA: Diagnosis not present

## 2012-01-03 DIAGNOSIS — Z170 Estrogen receptor positive status [ER+]: Secondary | ICD-10-CM | POA: Diagnosis not present

## 2012-01-03 DIAGNOSIS — Z510 Encounter for antineoplastic radiation therapy: Secondary | ICD-10-CM | POA: Diagnosis not present

## 2012-01-03 DIAGNOSIS — M7989 Other specified soft tissue disorders: Secondary | ICD-10-CM | POA: Diagnosis not present

## 2012-01-04 ENCOUNTER — Ambulatory Visit
Admission: RE | Admit: 2012-01-04 | Discharge: 2012-01-04 | Disposition: A | Discharge status: Home / Self Care | Payer: Medicare Other | Source: Ambulatory Visit | Attending: Radiation Oncology | Admitting: Radiation Oncology

## 2012-01-04 DIAGNOSIS — M1990 Unspecified osteoarthritis, unspecified site: Secondary | ICD-10-CM | POA: Diagnosis not present

## 2012-01-04 DIAGNOSIS — C50519 Malignant neoplasm of lower-outer quadrant of unspecified female breast: Secondary | ICD-10-CM | POA: Diagnosis not present

## 2012-01-04 DIAGNOSIS — E119 Type 2 diabetes mellitus without complications: Secondary | ICD-10-CM | POA: Diagnosis not present

## 2012-01-04 DIAGNOSIS — M7989 Other specified soft tissue disorders: Secondary | ICD-10-CM | POA: Diagnosis not present

## 2012-01-04 DIAGNOSIS — Z510 Encounter for antineoplastic radiation therapy: Secondary | ICD-10-CM | POA: Diagnosis not present

## 2012-01-04 DIAGNOSIS — Z170 Estrogen receptor positive status [ER+]: Secondary | ICD-10-CM | POA: Diagnosis not present

## 2012-01-05 ENCOUNTER — Ambulatory Visit
Admission: RE | Admit: 2012-01-05 | Discharge: 2012-01-05 | Disposition: A | Discharge status: Home / Self Care | Payer: Medicare Other | Source: Ambulatory Visit | Attending: Radiation Oncology | Admitting: Radiation Oncology

## 2012-01-05 DIAGNOSIS — M7989 Other specified soft tissue disorders: Secondary | ICD-10-CM | POA: Diagnosis not present

## 2012-01-05 DIAGNOSIS — Z170 Estrogen receptor positive status [ER+]: Secondary | ICD-10-CM | POA: Diagnosis not present

## 2012-01-05 DIAGNOSIS — Z510 Encounter for antineoplastic radiation therapy: Secondary | ICD-10-CM | POA: Diagnosis not present

## 2012-01-05 DIAGNOSIS — M1990 Unspecified osteoarthritis, unspecified site: Secondary | ICD-10-CM | POA: Diagnosis not present

## 2012-01-05 DIAGNOSIS — C50519 Malignant neoplasm of lower-outer quadrant of unspecified female breast: Secondary | ICD-10-CM | POA: Diagnosis not present

## 2012-01-05 DIAGNOSIS — E119 Type 2 diabetes mellitus without complications: Secondary | ICD-10-CM | POA: Diagnosis not present

## 2012-01-06 ENCOUNTER — Ambulatory Visit
Admission: RE | Admit: 2012-01-06 | Discharge: 2012-01-06 | Disposition: A | Discharge status: Home / Self Care | Payer: Medicare Other | Source: Ambulatory Visit | Attending: Radiation Oncology | Admitting: Radiation Oncology

## 2012-01-06 DIAGNOSIS — M7989 Other specified soft tissue disorders: Secondary | ICD-10-CM | POA: Diagnosis not present

## 2012-01-06 DIAGNOSIS — Z170 Estrogen receptor positive status [ER+]: Secondary | ICD-10-CM | POA: Diagnosis not present

## 2012-01-06 DIAGNOSIS — Z510 Encounter for antineoplastic radiation therapy: Secondary | ICD-10-CM | POA: Diagnosis not present

## 2012-01-06 DIAGNOSIS — E119 Type 2 diabetes mellitus without complications: Secondary | ICD-10-CM | POA: Diagnosis not present

## 2012-01-06 DIAGNOSIS — C50519 Malignant neoplasm of lower-outer quadrant of unspecified female breast: Secondary | ICD-10-CM | POA: Diagnosis not present

## 2012-01-06 DIAGNOSIS — M1990 Unspecified osteoarthritis, unspecified site: Secondary | ICD-10-CM | POA: Diagnosis not present

## 2012-01-09 ENCOUNTER — Encounter: Payer: Self-pay | Admitting: Radiation Oncology

## 2012-01-09 ENCOUNTER — Ambulatory Visit
Admission: RE | Admit: 2012-01-09 | Discharge: 2012-01-09 | Disposition: A | Discharge status: Home / Self Care | Payer: Medicare Other | Source: Ambulatory Visit | Attending: Radiation Oncology | Admitting: Radiation Oncology

## 2012-01-09 ENCOUNTER — Other Ambulatory Visit: Payer: Self-pay | Admitting: Radiation Oncology

## 2012-01-09 VITALS — BP 143/75 | HR 58 | Temp 97.5°F | Resp 20 | Wt 195.4 lb

## 2012-01-09 DIAGNOSIS — C50519 Malignant neoplasm of lower-outer quadrant of unspecified female breast: Secondary | ICD-10-CM | POA: Diagnosis not present

## 2012-01-09 DIAGNOSIS — Z170 Estrogen receptor positive status [ER+]: Secondary | ICD-10-CM | POA: Diagnosis not present

## 2012-01-09 DIAGNOSIS — E119 Type 2 diabetes mellitus without complications: Secondary | ICD-10-CM | POA: Diagnosis not present

## 2012-01-09 DIAGNOSIS — M7989 Other specified soft tissue disorders: Secondary | ICD-10-CM | POA: Diagnosis not present

## 2012-01-09 DIAGNOSIS — Z510 Encounter for antineoplastic radiation therapy: Secondary | ICD-10-CM | POA: Diagnosis not present

## 2012-01-09 DIAGNOSIS — M1990 Unspecified osteoarthritis, unspecified site: Secondary | ICD-10-CM | POA: Diagnosis not present

## 2012-01-09 MED ORDER — RADIAPLEXRX EX GEL: Freq: Once | CUTANEOUS | Status: DC

## 2012-01-09 NOTE — Progress Notes (Signed)
Encounter addended by: Amanda Pea, RN on: 01/09/2012  3:52 PM<BR>     Documentation filed: Orders

## 2012-01-09 NOTE — Progress Notes (Signed)
   Weekly Management Note, left breast cancer Current Dose:   3990cGy  Projected Dose: 4256 cGy   Narrative:  The patient presents for routine under treatment assessment.  CBCT/MVCT images/Port film x-rays were reviewed.  The chart was checked. She is doing well. Her skin irritation is manageable.  Physical Findings: Weight: 195 lb 6.4 oz (88.633 kg). Her left breast is slightly swollen and it is erythematous. The skin is intact.  Impression:  The patient is tolerating radiotherapy.  Plan:  Continue radiotherapy as planned. followup in one month. She will continue anti-estrogen treatment. She follows up with medical oncology later in the summer.

## 2012-01-09 NOTE — Progress Notes (Signed)
Patient alert,oriented x3, erythema under left arm and breast,skin intact, no c/o pain, 15/16 rad tx completed, gave 1 month f/u card appt, , 11:49 AM

## 2012-01-09 NOTE — Progress Notes (Signed)
Encounter addended by: Amanda Pea, RN on: 01/09/2012  6:10 PM<BR>     Documentation filed: Orders

## 2012-01-10 ENCOUNTER — Ambulatory Visit
Admission: RE | Admit: 2012-01-10 | Discharge: 2012-01-10 | Disposition: A | Discharge status: Home / Self Care | Payer: Medicare Other | Source: Ambulatory Visit | Attending: Radiation Oncology | Admitting: Radiation Oncology

## 2012-01-10 ENCOUNTER — Encounter: Payer: Self-pay | Admitting: Radiation Oncology

## 2012-01-10 ENCOUNTER — Ambulatory Visit: Payer: Medicare Other

## 2012-01-10 DIAGNOSIS — Z170 Estrogen receptor positive status [ER+]: Secondary | ICD-10-CM | POA: Diagnosis not present

## 2012-01-10 DIAGNOSIS — C50519 Malignant neoplasm of lower-outer quadrant of unspecified female breast: Secondary | ICD-10-CM | POA: Diagnosis not present

## 2012-01-10 DIAGNOSIS — M7989 Other specified soft tissue disorders: Secondary | ICD-10-CM | POA: Diagnosis not present

## 2012-01-10 DIAGNOSIS — M1990 Unspecified osteoarthritis, unspecified site: Secondary | ICD-10-CM | POA: Diagnosis not present

## 2012-01-10 DIAGNOSIS — E119 Type 2 diabetes mellitus without complications: Secondary | ICD-10-CM | POA: Diagnosis not present

## 2012-01-10 DIAGNOSIS — Z510 Encounter for antineoplastic radiation therapy: Secondary | ICD-10-CM | POA: Diagnosis not present

## 2012-01-11 ENCOUNTER — Ambulatory Visit: Payer: Medicare Other

## 2012-01-11 NOTE — Progress Notes (Incomplete)
  Radiation Oncology         [phone] ________________________________  Name: [name] MRN: [id number]  Date: 01/10/2012  DOB: [birth year]  End of Treatment Note  Diagnosis:   ***     Indication for treatment:  ***       Radiation treatment dates:   12/20/2011-01/10/2012  Site/dose:   ***  Beams/energy:   ***  Narrative: The patient tolerated radiation treatment relatively well.   ***  Plan: The patient has completed radiation treatment. The patient will return to radiation oncology clinic for routine followup in one month. I advised them to call or return sooner if they have any questions or concerns related to their recovery or treatment.  -----------------------------------  Lonie Peak, MD

## 2012-01-12 ENCOUNTER — Ambulatory Visit: Payer: Medicare Other

## 2012-01-13 ENCOUNTER — Ambulatory Visit: Payer: Medicare Other

## 2012-01-16 ENCOUNTER — Ambulatory Visit: Payer: Medicare Other

## 2012-01-17 ENCOUNTER — Ambulatory Visit: Payer: Medicare Other

## 2012-01-18 ENCOUNTER — Ambulatory Visit: Payer: Medicare Other

## 2012-01-19 ENCOUNTER — Ambulatory Visit: Payer: Medicare Other

## 2012-01-20 ENCOUNTER — Ambulatory Visit: Payer: Medicare Other

## 2012-01-20 NOTE — Progress Notes (Signed)
[facility] Radiation Oncology End of Treatment Note  Name:Nazareth Yerby  Date: 01/10/2012 ZOX:[id number] DOB:1932-12-21   Status:outpatient    DIAGNOSIS: Pathologic T1 CN0M0 ER/PR positive HER-2/neu negative grade 3 invasive ductal carcinoma of the left lower outer quadrant of the breast with high-grade ductal carcinoma in situ    INDICATION FOR TREATMENT: Curative   TREATMENT DATES: 12/20/11- 01/10/12                         SITE/DOSE:    Left breast; 4256cGy/16 fractions                       BEAMS/ENERGY:  tangents forward planned / 10 and 18 MV photons             NARRATIVE:   Ms. Mallet tolerated treatments well  with some skin erythema and breast swelling.                       PLAN: Routine followup in one month. Patient instructed to call if questions or worsening complaints in interim. She will continue anti-estrogen treatment. She follows up with medical oncology later in the summer.

## 2012-01-23 ENCOUNTER — Ambulatory Visit: Payer: Medicare Other

## 2012-01-24 ENCOUNTER — Ambulatory Visit: Payer: Medicare Other

## 2012-01-25 ENCOUNTER — Ambulatory Visit: Payer: Medicare Other

## 2012-01-26 ENCOUNTER — Ambulatory Visit: Payer: Medicare Other

## 2012-01-27 ENCOUNTER — Ambulatory Visit: Payer: Medicare Other

## 2012-01-31 ENCOUNTER — Ambulatory Visit: Payer: Medicare Other

## 2012-02-01 ENCOUNTER — Ambulatory Visit: Payer: Medicare Other

## 2012-02-02 ENCOUNTER — Ambulatory Visit: Payer: Medicare Other

## 2012-02-03 ENCOUNTER — Ambulatory Visit: Payer: Medicare Other

## 2012-02-10 ENCOUNTER — Ambulatory Visit
Admission: RE | Admit: 2012-02-10 | Discharge: 2012-02-10 | Disposition: A | Discharge status: Home / Self Care | Payer: Medicare Other | Source: Ambulatory Visit | Attending: Radiation Oncology | Admitting: Radiation Oncology

## 2012-02-10 ENCOUNTER — Encounter: Payer: Self-pay | Admitting: Radiation Oncology

## 2012-02-10 VITALS — BP 149/61 | HR 55 | Temp 97.2°F | Wt 193.2 lb

## 2012-02-10 DIAGNOSIS — C50519 Malignant neoplasm of lower-outer quadrant of unspecified female breast: Secondary | ICD-10-CM

## 2012-02-10 DIAGNOSIS — C50912 Malignant neoplasm of unspecified site of left female breast: Secondary | ICD-10-CM

## 2012-02-10 HISTORY — DX: Personal history of irradiation: Z92.3

## 2012-02-10 NOTE — Progress Notes (Signed)
Encounter addended by: Agnes Lawrence, RN on: 02/10/2012 12:38 PM<BR>     Documentation filed: Charges VN

## 2012-02-10 NOTE — Progress Notes (Signed)
Radiation Oncology         [phone] ________________________________  Name: [name] MRN: [id number]  Date: 02/10/2012  DOB: [birth year]  Follow-Up Visit Note  CC: Junious Silk, MD, MD  Wynona Luna., MD  Diagnosis:   T1cN0M0 Stage I ER+ Breast cancer, left  Interval Since Last Radiation:  1 month  Narrative:  The patient returns today for routine follow-up.  Doing well. Occasional shooting pains in her breast. Her energy has improved. She is taking anastrozole.                           ALLERGIES:   has no known allergies.  Meds: Current Outpatient Prescriptions  Medication Sig Dispense Refill  . anastrozole (ARIMIDEX) 1 MG tablet       . aspirin 81 MG tablet Take 81 mg by mouth daily.        . cetirizine (ZYRTEC) 10 MG tablet Take 10 mg by mouth as needed.       . cholecalciferol (VITAMIN D) 1000 UNITS tablet Take 2,000 Units by mouth daily.        . CRESTOR 20 MG tablet Take 20 mg by mouth daily.       . diazepam (VALIUM) 5 MG tablet Take 5 mg by mouth every 6 (six) hours as needed.       Marland Kitchen esomeprazole (NEXIUM) 40 MG capsule Take 40 mg by mouth daily before breakfast.        . ezetimibe (ZETIA) 10 MG tablet Take 10 mg by mouth daily.        . furosemide (LASIX) 20 MG tablet Take 1 tablet (20 mg total) by mouth daily.  90 tablet  3  . HYDROcodone-acetaminophen (VICODIN) 5-500 MG per tablet Take 1 tablet by mouth every 6 (six) hours as needed.      Marland Kitchen levothyroxine (SYNTHROID, LEVOTHROID) 125 MCG tablet Take 125 mcg by mouth daily.        . metoprolol tartrate (LOPRESSOR) 50 MG tablet Take one and one-half tablet by mouth twice a day  270 tablet  3  . Multiple Vitamin (MULTIVITAMIN) capsule Take 1 capsule by mouth daily.        . NON FORMULARY daily. 15 treatments of radiation a day      . pioglitazone (ACTOS) 15 MG tablet Take 15 mg by mouth daily.        . potassium chloride SA (K-DUR,KLOR-CON) 10 MEQ tablet Take 1 tablet (10 mEq total) by mouth daily.  90  tablet  3  . rOPINIRole (REQUIP) 3 MG tablet Take 3 mg by mouth at bedtime.       . traMADol (ULTRAM) 50 MG tablet Take 50 mg by mouth every 6 (six) hours as needed. Maximum dose= 8 tablets per day         Physical Findings: The patient is in no acute distress. Patient is alert and oriented.  weight is 193 lb 3.2 oz (87.635 kg). Her temperature is 97.2 F (36.2 C). Her blood pressure is 149/61 and her pulse is 55. .  No significant changes. She has very faint residual hyperpigmentation over her left breast. Skin is intact.  Lab Findings: Lab Results  Component Value Date   WBC 6.4 12/07/2011   HGB 13.2 12/07/2011   HCT 39.5 12/07/2011   MCV 84.6 12/07/2011   PLT 276 12/07/2011      Impression:  The patient is recovering from the effects of radiation.  Plan: I recommended that the patient use sunscreen and SPF t-shirts  to protect her skin this summer.She may apply Vitamin E cream to her breast for further healing.   Follow up on a when necessary basis. In the meantime she will be seeing Dr. Darnelle Catalan in July and she will continue anastrozole. I've encouraged the patient to call if she needs to be worked into my schedule at any time.  ________________________________  Lonie Peak, MD

## 2012-02-10 NOTE — Progress Notes (Signed)
Intermittent Shooting pains Left Breast. Skin intact with mild erythema to left breast. 2 reddened areas in the left inframmary fold, but no evidence of drainage.  Reports that she has intermittent "shooting" pains in her left breast and it was explained that theses "shooting" pains occur when the nerve ending cut during surgery have not reconnected yet and informed she may experience this up to 1 year or more post surgery.   Reports "a little" fatigue.  Taking Arimidex and notes fatigue and intermittent nausea.

## 2012-03-01 DIAGNOSIS — M25539 Pain in unspecified wrist: Secondary | ICD-10-CM | POA: Diagnosis not present

## 2012-03-01 DIAGNOSIS — S6990XA Unspecified injury of unspecified wrist, hand and finger(s), initial encounter: Secondary | ICD-10-CM | POA: Diagnosis not present

## 2012-03-01 DIAGNOSIS — S63509A Unspecified sprain of unspecified wrist, initial encounter: Secondary | ICD-10-CM | POA: Diagnosis not present

## 2012-03-14 ENCOUNTER — Other Ambulatory Visit: Payer: Medicare Other

## 2012-03-22 ENCOUNTER — Other Ambulatory Visit (HOSPITAL_BASED_OUTPATIENT_CLINIC_OR_DEPARTMENT_OTHER): Payer: Medicare Other | Admitting: Lab

## 2012-03-22 ENCOUNTER — Telehealth: Payer: Self-pay | Admitting: *Deleted

## 2012-03-22 ENCOUNTER — Ambulatory Visit (HOSPITAL_BASED_OUTPATIENT_CLINIC_OR_DEPARTMENT_OTHER): Payer: Medicare Other | Admitting: Oncology

## 2012-03-22 VITALS — BP 152/76 | HR 55 | Temp 98.3°F | Ht 62.0 in | Wt 193.5 lb

## 2012-03-22 DIAGNOSIS — C50519 Malignant neoplasm of lower-outer quadrant of unspecified female breast: Secondary | ICD-10-CM

## 2012-03-22 DIAGNOSIS — Z170 Estrogen receptor positive status [ER+]: Secondary | ICD-10-CM

## 2012-03-22 DIAGNOSIS — M810 Age-related osteoporosis without current pathological fracture: Secondary | ICD-10-CM | POA: Diagnosis not present

## 2012-03-22 DIAGNOSIS — C50912 Malignant neoplasm of unspecified site of left female breast: Secondary | ICD-10-CM

## 2012-03-22 LAB — CBC WITH DIFFERENTIAL/PLATELET
Eosinophils Absolute: 0.1 10*3/uL (ref 0.0–0.5)
MONO#: 0.5 10*3/uL (ref 0.1–0.9)
MONO%: 8.7 % (ref 0.0–14.0)
NEUT#: 4 10*3/uL (ref 1.5–6.5)
RBC: 4.75 10*6/uL (ref 3.70–5.45)
RDW: 14.4 % (ref 11.2–14.5)
WBC: 6.2 10*3/uL (ref 3.9–10.3)

## 2012-03-22 NOTE — Progress Notes (Signed)
ID: [name]   DOB: 1933/09/02  MR#: [id number]  WUJ#:[id number]  HISTORY OF PRESENT ILLNESS: The patient was noted to have left breast calcifications which have been followed on a every 6 month basis, most recently 10/11/2011. Bilateral diagnostic mammography on that date showed a mass in the medial left breast and a separate area of linear calcifications. The right breast was unremarkable. Ultrasound showed a simple cysts correlating with the mass seen on mammography. The area of abnormal calcifications in the lower outer quadrant was biopsied 10/20/2011 and showed ([id number]) and invasive ductal carcinoma, which appeared low to intermediate grade, and which was estrogen receptor positive at 98%, progesterone receptor positive at 54%, with no HER-2 amplification, and with an MIB-1 of 47%  On 10/27/2011 the patient underwent bilateral breast MRIs. This showed post biopsy change but also a 3 mm focus of enhancement in the lateral aspect of the biopsy cavity. There was no MRI evidence for multifocal, or contralateral malignancy or for lymph node spread. Accordingly, on 11/21/2011 the patient underwent left lumpectomy and sentinel lymph node sampling under Dr. Corliss Skains. This showed ([id number]) high-grade invasive ductal carcinoma, measuring 1.1 cm, with 0 of 2 lymph nodes negative, and ample margins. Repeat HER-2 testing again showed no amplification.  Her subsequent history is as detailed below.  INTERVAL HISTORY:  [name] returns after completing her radiation treatments in early May. She had some desquamation and quite a bit of fatigue, but does her best to keep up with her regular activities. She started anastrozole shortly after radiation. Generally she is tolerating that well.  REVIEW OF SYSTEMS: She fell approximately 3 weeks ago, and was evaluated in the emergency room, were no fractures were noted. She does have some joint arthritis pain. Particularly the third digit of the left hand bothers  her. Sometimes it's the right lower extremity sometimes the right knee. These are not new problems, and they don't seem to be worse since he she started the anastrozole. She was having significant hot flashes but switched to the anastrozole to bedtime and that helped. Incidentally she is getting that medication for $9 a month which is acceptable to her. A detailed review of systems was otherwise noncontributory.  PAST MEDICAL HISTORY: Past Medical History  Diagnosis Date  . Diverticulitis of colon with perforation   . S/P CABG x 2 06/2011    Dr. Tyrone Sage (L[facility]LAD, [facility])  . Carotid stenosis     dopplers 10/12: LICA 40-59%  . DM2 (diabetes mellitus, type 2)   . HTN (hypertension)   . HLD (hyperlipidemia)   . Hypothyroidism   . RLS (restless legs syndrome)   . Breast cancer, left breast 11/01/2011  . CAD (coronary artery disease)     LHC 06/14/11: LAD 80% and 80% after Dx, Dx 80% (Dx was small), oOM 80%, mOM 70% (OM was small), oRCA 70-80%, mRCA 60-70%, EF 55% - referred for CABG;   Echo 06/14/11: mild focal basal septal hypertrophy, grade 1 diast dysfxn, mild LAE, mild RVE, PASP 37.    . Kidney stones   . GERD (gastroesophageal reflux disease)   . Psoriasis   . Diabetes mellitus     x 5-6 yrs  . Breast cancer 10/20/11    L breast, ER/PR+, HER2 -  . S/P radiation therapy 12/20/11 - 01/10/12    Left Breast: 4256 cGy/ 16 Fractions    PAST SURGICAL HISTORY: Past Surgical History  Procedure Date  . Colon resection 1977  . Arthroscopy right knee   .  Off-pump coronary artery bypass grafting x2 06/16/2011  . Vein bypass surgery 1999  . Colon surgery     part colectomy-  . Appendectomy   . Coronary artery bypass graft 10/12  . Breast biospy 11/04/11    Breast, Left, Needle Core Biopsy 6 Oclock, Benign Breast Tissue wtih Fibrocystic  Changes: Microcalcifications Identified, No Neoplasm or Malignancy Identified  . Breast biospy 10/20/11    Breast, Left, Needle Core Biopsy LOQ: Invsive ductal  Carcinoman  . Breast lumpectomy 11/22/11    Breast, Lumpectomy, Left with Sentinel Node Biopsy: Invasive High Grade Ductal Carcinoma : No lymphovascular Invasion: High  Grade Ductal Carcinoma In-Situ with Calcification and Comedo Necrosis  . Abdominal hysterectomy     age 78's    FAMILY HISTORY Family History  Problem Relation Age of Onset  . Cancer Mother     throat  . Hypertension Brother   . Cancer Paternal Uncle    the patient's father died at the age of 57 from a subarachnoid hemorrhage. The patient's mother died at the age of 67 from congestive heart failure. She had a history of throat cancer. The patient has one brother, currently 66 years old. She had no sisters. There is no history of breast or ovarian cancer in the family  GYNECOLOGIC HISTORY: She had menarche age 41. She is GX P2, first pregnancy to term age 5. She underwent hysterectomy around age 32 to do to menorrhagia. She took Premarin as a patch for many years, discontinued about 5 years ago  SOCIAL HISTORY: She worked as a Catering manager in a Therapist, art. Her husband of 58 years, [name] (goes by [name]) Used to work for [employer]. Their son [name] lives in [place] and works for [employer]. The patient's other child was stillborn. She has no grandchildren. She belongs to the [employer] and wants me to know that on Saturdays starting in May from 8 AM to 2:30 PM they have a hot dog sale that is considered the best in [place] (actually featured in the [employer] this month)  ADVANCED DIRECTIVES:  HEALTH MAINTENANCE: History  Substance Use Topics  . Smoking status: Former Smoker -- 1.0 packs/day for 6 years    Quit date: 09/05/1980  . Smokeless tobacco: Not on file  . Alcohol Use: Yes     socially     Colonoscopy: "about 2007, no need to repeat"  PAP: s/p hysterectomy  Bone density: At the breast center 09/12/2008, showing significant osteopenia  Lipid panel: Under  treatment  No Known Allergies  Current Outpatient Prescriptions  Medication Sig Dispense Refill  . anastrozole (ARIMIDEX) 1 MG tablet       . aspirin 81 MG tablet Take 81 mg by mouth daily.        . cholecalciferol (VITAMIN D) 1000 UNITS tablet Take 2,000 Units by mouth daily.        . CRESTOR 20 MG tablet Take 20 mg by mouth daily.       . diazepam (VALIUM) 5 MG tablet Take 5 mg by mouth every 6 (six) hours as needed.       Marland Kitchen esomeprazole (NEXIUM) 40 MG capsule Take 40 mg by mouth daily before breakfast.        . ezetimibe (ZETIA) 10 MG tablet Take 10 mg by mouth daily.        . furosemide (LASIX) 20 MG tablet Take 1 tablet (20 mg total) by mouth daily.  90 tablet  3  .  levothyroxine (SYNTHROID, LEVOTHROID) 125 MCG tablet Take 125 mcg by mouth daily.        . metoprolol tartrate (LOPRESSOR) 50 MG tablet Take one and one-half tablet by mouth twice a day  270 tablet  3  . Multiple Vitamin (MULTIVITAMIN) capsule Take 1 capsule by mouth daily.        . pioglitazone (ACTOS) 15 MG tablet Take 15 mg by mouth daily.        . potassium chloride SA (K-DUR,KLOR-CON) 10 MEQ tablet Take 1 tablet (10 mEq total) by mouth daily.  90 tablet  3  . rOPINIRole (REQUIP) 3 MG tablet Take 3 mg by mouth at bedtime.       . cetirizine (ZYRTEC) 10 MG tablet Take 10 mg by mouth as needed.       Marland Kitchen HYDROcodone-acetaminophen (VICODIN) 5-500 MG per tablet Take 1 tablet by mouth every 6 (six) hours as needed.      . NON FORMULARY daily. 15 treatments of radiation a day      . traMADol (ULTRAM) 50 MG tablet Take 50 mg by mouth every 6 (six) hours as needed. Maximum dose= 8 tablets per day         OBJECTIVE: Middle-aged white woman in no acute distress Filed Vitals:   03/22/12 0953  BP: 152/76  Pulse: 55  Temp: 98.3 F (36.8 C)     Body mass index is 35.39 kg/(m^2).    ECOG FS: 1  Sclerae unicteric Oropharynx clear No peripheral adenopathy Lungs no rales or rhonchi Heart regular rate and rhythm, no murmur  appreciated Abd benign MSK no focal spinal tenderness, no peripheral edema Neuro: nonfocal Breasts: Right breast no suspicious findings; left breast status post  lumpectomy; no evidence of local recurrence  LAB RESULTS: Lab Results  Component Value Date   WBC 6.2 03/22/2012   NEUTROABS 4.0 03/22/2012   HGB 13.9 03/22/2012   HCT 41.2 03/22/2012   MCV 86.6 03/22/2012   PLT 262 03/22/2012      Chemistry      Component Value Date/Time   NA 143 12/07/2011 0914   K 4.1 12/07/2011 0914   CL 108 12/07/2011 0914   CO2 23 12/07/2011 0914   BUN 18 12/07/2011 0914   CREATININE 0.75 12/07/2011 0914      Component Value Date/Time   CALCIUM 9.7 12/07/2011 0914   ALKPHOS 109 12/07/2011 0914   AST 20 12/07/2011 0914   ALT 16 12/07/2011 0914   BILITOT 0.7 12/07/2011 0914       Lab Results  Component Value Date   LABCA2 13 12/07/2011    No components found with this basename: [id number]    No results found for this basename: INR:1;PROTIME:1 in the last 168 hours  Urinalysis    Component Value Date/Time   COLORURINE YELLOW 06/15/2011 0810   APPEARANCEUR CLOUDY* 06/15/2011 0810   LABSPEC 1.031* 06/15/2011 0810   PHURINE 5.0 06/15/2011 0810   GLUCOSEU NEGATIVE 06/15/2011 0810   HGBUR NEGATIVE 06/15/2011 0810   BILIRUBINUR NEGATIVE 06/15/2011 0810   KETONESUR NEGATIVE 06/15/2011 0810   PROTEINUR NEGATIVE 06/15/2011 0810   UROBILINOGEN 0.2 06/15/2011 0810   NITRITE NEGATIVE 06/15/2011 0810   LEUKOCYTESUR MODERATE* 06/15/2011 0810    STUDIES: No results found.   ASSESSMENT: 78 y.o.  [place] woman, status post left lumpectomy and sentinel lymph node sampling 11/21/2011 for a T1c N0, stage IA invasive ductal carcinoma, grade 3, strongly estrogen and progesterone receptor positive, with an MIB-1 of 47%, and no  HER-2 amplification. Completed radiation 01/10/2012 and started anastrozole June 2013  PLAN:   The bone density from April 2013 confirms osteoprosis. We again discussed zoledronic acid today and  she has a good idea regarding the possible toxicities, side effects and complications of this medication. The plan is to start zolendronic acid in mid-September, and repeat it once a year so long as she is on anastrozole. This will largely take care of the possible worsening of bone loss from that medication; nevertheless she should continue to walk on a regular basis and to supplement calcium and vitamin D. Otherwise she will see [place] again in 3 months. She knows to call for any problems that may develop before that visit.      MAGRINAT,GUSTAV C    03/22/2012

## 2012-03-22 NOTE — Telephone Encounter (Signed)
gave patient appointment for lab only on 05-24-2012 at 9:45am 06-22-2012 midlevel and labs all the same day printed out calendar and gave to the patient

## 2012-03-26 DIAGNOSIS — M810 Age-related osteoporosis without current pathological fracture: Secondary | ICD-10-CM

## 2012-03-28 ENCOUNTER — Other Ambulatory Visit: Payer: Self-pay | Admitting: *Deleted

## 2012-03-29 ENCOUNTER — Telehealth: Payer: Self-pay | Admitting: *Deleted

## 2012-03-29 NOTE — Telephone Encounter (Signed)
Per staff message and POF I have scheduled appt.  JMW  

## 2012-04-16 DIAGNOSIS — S335XXA Sprain of ligaments of lumbar spine, initial encounter: Secondary | ICD-10-CM | POA: Diagnosis not present

## 2012-04-16 DIAGNOSIS — IMO0002 Reserved for concepts with insufficient information to code with codable children: Secondary | ICD-10-CM | POA: Diagnosis not present

## 2012-04-16 DIAGNOSIS — M5137 Other intervertebral disc degeneration, lumbosacral region: Secondary | ICD-10-CM | POA: Diagnosis not present

## 2012-05-02 DIAGNOSIS — M5137 Other intervertebral disc degeneration, lumbosacral region: Secondary | ICD-10-CM | POA: Diagnosis not present

## 2012-05-08 DIAGNOSIS — M5137 Other intervertebral disc degeneration, lumbosacral region: Secondary | ICD-10-CM | POA: Diagnosis not present

## 2012-05-15 DIAGNOSIS — I10 Essential (primary) hypertension: Secondary | ICD-10-CM | POA: Diagnosis not present

## 2012-05-15 DIAGNOSIS — E785 Hyperlipidemia, unspecified: Secondary | ICD-10-CM | POA: Diagnosis not present

## 2012-05-15 DIAGNOSIS — E039 Hypothyroidism, unspecified: Secondary | ICD-10-CM | POA: Diagnosis not present

## 2012-05-15 DIAGNOSIS — E119 Type 2 diabetes mellitus without complications: Secondary | ICD-10-CM | POA: Diagnosis not present

## 2012-05-17 DIAGNOSIS — Z23 Encounter for immunization: Secondary | ICD-10-CM | POA: Diagnosis not present

## 2012-05-17 DIAGNOSIS — E039 Hypothyroidism, unspecified: Secondary | ICD-10-CM | POA: Diagnosis not present

## 2012-05-17 DIAGNOSIS — E785 Hyperlipidemia, unspecified: Secondary | ICD-10-CM | POA: Diagnosis not present

## 2012-05-17 DIAGNOSIS — I10 Essential (primary) hypertension: Secondary | ICD-10-CM | POA: Diagnosis not present

## 2012-05-17 DIAGNOSIS — E119 Type 2 diabetes mellitus without complications: Secondary | ICD-10-CM | POA: Diagnosis not present

## 2012-05-23 ENCOUNTER — Other Ambulatory Visit: Payer: Self-pay | Admitting: *Deleted

## 2012-05-23 DIAGNOSIS — C50912 Malignant neoplasm of unspecified site of left female breast: Secondary | ICD-10-CM

## 2012-05-24 ENCOUNTER — Ambulatory Visit (HOSPITAL_BASED_OUTPATIENT_CLINIC_OR_DEPARTMENT_OTHER): Payer: Medicare Other

## 2012-05-24 ENCOUNTER — Other Ambulatory Visit (HOSPITAL_BASED_OUTPATIENT_CLINIC_OR_DEPARTMENT_OTHER): Payer: Medicare Other | Admitting: Lab

## 2012-05-24 ENCOUNTER — Ambulatory Visit: Payer: Medicare Other | Admitting: Lab

## 2012-05-24 VITALS — BP 148/55 | HR 61 | Temp 97.7°F

## 2012-05-24 DIAGNOSIS — C50919 Malignant neoplasm of unspecified site of unspecified female breast: Secondary | ICD-10-CM

## 2012-05-24 DIAGNOSIS — C50912 Malignant neoplasm of unspecified site of left female breast: Secondary | ICD-10-CM

## 2012-05-24 DIAGNOSIS — M810 Age-related osteoporosis without current pathological fracture: Secondary | ICD-10-CM | POA: Diagnosis not present

## 2012-05-24 LAB — BASIC METABOLIC PANEL (CC13)
BUN: 13 mg/dL (ref 7.0–26.0)
CO2: 23 mEq/L (ref 22–29)
Chloride: 107 mEq/L (ref 98–107)
Glucose: 98 mg/dl (ref 70–99)
Potassium: 4 mEq/L (ref 3.5–5.1)

## 2012-05-24 MED ORDER — SODIUM CHLORIDE 0.9 % IV SOLN
Freq: Once | INTRAVENOUS | Status: AC
  Administered 2012-05-24: 11:00:00 via INTRAVENOUS

## 2012-05-24 MED ORDER — ZOLEDRONIC ACID 4 MG/5ML IV CONC
4.0000 mg | Freq: Once | INTRAVENOUS | Status: AC
  Administered 2012-05-24: 4 mg via INTRAVENOUS
  Filled 2012-05-24: qty 5

## 2012-05-24 NOTE — Patient Instructions (Signed)
Take 8 Tums starting today and for next two days (3 days total). Drink plenty of flluids - at least 40-50oz daily. Call for any jaw pain. Let your dentist know you have taken zometa.

## 2012-05-25 ENCOUNTER — Telehealth: Payer: Self-pay | Admitting: Medical Oncology

## 2012-05-25 NOTE — Telephone Encounter (Signed)
Patient LMOVM stating that "The paper they gave me yesterday after my infusion says that I should not be taking any fluid pills but I take furosemide 20 mg daily from my cardiologist."  Reviewed with Zollie Scale.  Per Zollie Scale, ok for patient to continue taking furosemide daily.  Patient expressed understanding, no further questions at this time.

## 2012-06-15 ENCOUNTER — Ambulatory Visit (INDEPENDENT_AMBULATORY_CARE_PROVIDER_SITE_OTHER): Payer: Medicare Other | Admitting: Surgery

## 2012-06-15 ENCOUNTER — Encounter (INDEPENDENT_AMBULATORY_CARE_PROVIDER_SITE_OTHER): Payer: Self-pay | Admitting: Surgery

## 2012-06-15 VITALS — BP 170/80 | HR 68 | Temp 97.1°F | Resp 20 | Ht 64.0 in | Wt 197.8 lb

## 2012-06-15 DIAGNOSIS — C50519 Malignant neoplasm of lower-outer quadrant of unspecified female breast: Secondary | ICD-10-CM | POA: Diagnosis not present

## 2012-06-15 NOTE — Progress Notes (Signed)
Status post left lumpectomy sentinel lymph node biopsy on 11/04/11 for invasive ductal carcinoma T1 C. N0 MX. She is currently on Arimidex and is being treated with Reclast.  Her incisions are well-healed. The radiation skin changes are fading. No new masses. She is due for another mammogram in January.  On examination her lumpectomy and axillary sentinel lymph node incisions are well-healed with no sign of infection. No palpable masses in either axilla or in either breast. The skin of both nipples is thickened but this is a chronic change. This was present even prior to her surgery. There does not seem to be any recent changes. No nipple retraction or drainage.  The patient should keep up with her annual mammograms. We will recheck her in 6 months.  [name]. Corliss Skains, MD, [facility]  06/15/2012 9:42 AM

## 2012-06-21 ENCOUNTER — Other Ambulatory Visit: Payer: Self-pay | Admitting: *Deleted

## 2012-06-21 DIAGNOSIS — C50912 Malignant neoplasm of unspecified site of left female breast: Secondary | ICD-10-CM

## 2012-06-22 ENCOUNTER — Encounter: Payer: Self-pay | Admitting: Physician Assistant

## 2012-06-22 ENCOUNTER — Other Ambulatory Visit (HOSPITAL_BASED_OUTPATIENT_CLINIC_OR_DEPARTMENT_OTHER): Payer: Medicare Other | Admitting: Lab

## 2012-06-22 ENCOUNTER — Ambulatory Visit (HOSPITAL_BASED_OUTPATIENT_CLINIC_OR_DEPARTMENT_OTHER): Payer: Medicare Other | Admitting: Physician Assistant

## 2012-06-22 ENCOUNTER — Telehealth: Payer: Self-pay | Admitting: Oncology

## 2012-06-22 VITALS — BP 164/71 | HR 67 | Temp 99.0°F | Resp 20 | Ht 64.0 in | Wt 198.0 lb

## 2012-06-22 DIAGNOSIS — C50519 Malignant neoplasm of lower-outer quadrant of unspecified female breast: Secondary | ICD-10-CM | POA: Diagnosis not present

## 2012-06-22 DIAGNOSIS — M810 Age-related osteoporosis without current pathological fracture: Secondary | ICD-10-CM

## 2012-06-22 DIAGNOSIS — C50919 Malignant neoplasm of unspecified site of unspecified female breast: Secondary | ICD-10-CM | POA: Diagnosis not present

## 2012-06-22 DIAGNOSIS — C50912 Malignant neoplasm of unspecified site of left female breast: Secondary | ICD-10-CM

## 2012-06-22 DIAGNOSIS — Z853 Personal history of malignant neoplasm of breast: Secondary | ICD-10-CM

## 2012-06-22 LAB — CBC WITH DIFFERENTIAL/PLATELET
BASO%: 0.7 % (ref 0.0–2.0)
LYMPH%: 25.8 % (ref 14.0–49.7)
MCH: 29.3 pg (ref 25.1–34.0)
MCHC: 33.7 g/dL (ref 31.5–36.0)
MCV: 87 fL (ref 79.5–101.0)
MONO%: 7.4 % (ref 0.0–14.0)
Platelets: 241 10*3/uL (ref 145–400)
RBC: 4.59 10*6/uL (ref 3.70–5.45)

## 2012-06-22 LAB — COMPREHENSIVE METABOLIC PANEL (CC13)
ALT: 36 U/L (ref 0–55)
AST: 28 U/L (ref 5–34)
CO2: 21 mEq/L — ABNORMAL LOW (ref 22–29)
Calcium: 9.3 mg/dL (ref 8.4–10.4)
Chloride: 110 mEq/L — ABNORMAL HIGH (ref 98–107)
Creatinine: 0.8 mg/dL (ref 0.6–1.1)
Sodium: 140 mEq/L (ref 136–145)
Total Bilirubin: 1 mg/dL (ref 0.20–1.20)
Total Protein: 6.2 g/dL — ABNORMAL LOW (ref 6.4–8.3)

## 2012-06-22 NOTE — Patient Instructions (Signed)
Continue on anastrazole daily as before.  Mammo at [facility] in Feb 2014  Return in Feb 2014 for repeat labs and follow up visit.  Call with any problems or questions   [phone]

## 2012-06-22 NOTE — Progress Notes (Signed)
ID: [name]   DOB: [birth year]  MR#: [id number]  YQM#:[id number]  HISTORY OF PRESENT ILLNESS: The patient was noted to have left breast calcifications which have been followed on a every 6 month basis, most recently 10/11/2011. Bilateral diagnostic mammography on that date showed a mass in the medial left breast and a separate area of linear calcifications. The right breast was unremarkable. Ultrasound showed a simple cysts correlating with the mass seen on mammography. The area of abnormal calcifications in the lower outer quadrant was biopsied 10/20/2011 and showed ([id number]) and invasive ductal carcinoma, which appeared low to intermediate grade, and which was estrogen receptor positive at 98%, progesterone receptor positive at 54%, with no HER-2 amplification, and with an MIB-1 of 47%  On 10/27/2011 the patient underwent bilateral breast MRIs. This showed post biopsy change but also a 3 mm focus of enhancement in the lateral aspect of the biopsy cavity. There was no MRI evidence for multifocal, or contralateral malignancy or for lymph node spread. Accordingly, on 11/21/2011 the patient underwent left lumpectomy and sentinel lymph node sampling under Dr. Corliss Skains. This showed ([id number]) high-grade invasive ductal carcinoma, measuring 1.1 cm, with 0 of 2 lymph nodes negative, and ample margins. Repeat HER-2 testing again showed no amplification.  Her subsequent history is as detailed below.  INTERVAL HISTORY:  [name] returns for routine followup of her left breast carcinoma. She continues on anastrozole which she is tolerating very well. She does have some hot flashes, but during the day and night. So far she is "tolerating them".  Interval history is remarkable for patient having received her first dose of zoledronic acid one month ago, 05/24/2012. She had a low-grade temp (under 100) as well as some flu like aches and pains for the first 24 hours after infusion. These resolved easily, and she  had no additional side effects or complications. She's had no jaw pain and denies any recent dental procedures.  REVIEW OF SYSTEMS: [name] has had no recent illnesses and denies any fevers, chills, or night sweats. She's eating and drinking well denies any nausea or change in bowel habits. She is trying to exercise more frequently, and would like to lose some weight. She has no new cough or phlegm production. She has some shortness of breath with exertion which she attributes to deconditioning. She's had no chest pain or palpitations. She denies any abnormal headaches, dizziness, or change in vision. She has some arthritic pain, especially in the knees, but this has not worsened since beginning the anastrozole and she has no new or unusual myalgias, arthralgias, or bony pain elsewhere.  A detailed review of systems is otherwise stable and noncontributory.   PAST MEDICAL HISTORY: Past Medical History  Diagnosis Date  . Diverticulitis of colon with perforation   . S/P CABG x 2 06/2011    Dr. Tyrone Sage (L-LAD, [facility])  . Carotid stenosis     dopplers 10/12: LICA 40-59%  . DM2 (diabetes mellitus, type 2)   . HTN (hypertension)   . HLD (hyperlipidemia)   . Hypothyroidism   . RLS (restless legs syndrome)   . Breast cancer, left breast 11/01/2011  . CAD (coronary artery disease)     LHC 06/14/11: LAD 80% and 80% after Dx, Dx 80% (Dx was small), oOM 80%, mOM 70% (OM was small), oRCA 70-80%, mRCA 60-70%, EF 55% - referred for CABG;   Echo 06/14/11: mild focal basal septal hypertrophy, grade 1 diast dysfxn, mild LAE, mild RVE, PASP 37.    Marland Kitchen  Kidney stones   . GERD (gastroesophageal reflux disease)   . Psoriasis   . Diabetes mellitus     x 5-6 yrs  . Breast cancer 10/20/11    L breast, ER/PR+, HER2 -  . S/P radiation therapy 12/20/11 - 01/10/12    Left Breast: 4256 cGy/ 16 Fractions    PAST SURGICAL HISTORY: Past Surgical History  Procedure Date  . Colon resection 1977  . Arthroscopy right knee     . Off-pump coronary artery bypass grafting x2 06/16/2011  . Vein bypass surgery 1999  . Colon surgery     part colectomy-  . Appendectomy   . Coronary artery bypass graft 10/12  . Breast biospy 11/04/11    Breast, Left, Needle Core Biopsy 6 Oclock, Benign Breast Tissue wtih Fibrocystic  Changes: Microcalcifications Identified, No Neoplasm or Malignancy Identified  . Breast biospy 10/20/11    Breast, Left, Needle Core Biopsy LOQ: Invsive ductal Carcinoman  . Breast lumpectomy 11/22/11    Breast, Lumpectomy, Left with Sentinel Node Biopsy: Invasive High Grade Ductal Carcinoma : No lymphovascular Invasion: High  Grade Ductal Carcinoma In-Situ with Calcification and Comedo Necrosis  . Abdominal hysterectomy     age 70's    FAMILY HISTORY Family History  Problem Relation Age of Onset  . Cancer Mother     throat  . Hypertension Brother   . Cancer Paternal Uncle    the patient's father died at the age of 34 from a subarachnoid hemorrhage. The patient's mother died at the age of 51 from congestive heart failure. She had a history of throat cancer. The patient has one brother, currently 62 years old. She had no sisters. There is no history of breast or ovarian cancer in the family  GYNECOLOGIC HISTORY: She had menarche age 75. She is GX P2, first pregnancy to term age 61. She underwent hysterectomy around age 24 to do to menorrhagia. She took Premarin as a patch for many years, discontinued about 5 years ago  SOCIAL HISTORY: She worked as a Catering manager in a Therapist, art. Her husband of 58 years, [name] (goes by [name]) Used to work for [employer]. Their son [name] lives in [place] and works for [employer]. The patient's other child was stillborn. She has no grandchildren. She belongs to the [employer] and wants me to know that on Saturdays starting in May from 8 AM to 2:30 PM they have a hot dog sale that is considered the best in [place] (actually featured in the  [employer] this month)  ADVANCED DIRECTIVES:  HEALTH MAINTENANCE: History  Substance Use Topics  . Smoking status: Former Smoker -- 1.0 packs/day for 6 years    Quit date: 09/05/1980  . Smokeless tobacco: Not on file  . Alcohol Use: Yes     socially     Colonoscopy: "about 2007, no need to repeat"  PAP: s/p hysterectomy  Bone density: At the breast center 09/12/2008, showing significant osteopenia  Lipid panel: Under treatment  No Known Allergies  Current Outpatient Prescriptions  Medication Sig Dispense Refill  . anastrozole (ARIMIDEX) 1 MG tablet       . aspirin 81 MG tablet Take 81 mg by mouth daily.        . cetirizine (ZYRTEC) 10 MG tablet Take 10 mg by mouth as needed.       . cholecalciferol (VITAMIN D) 1000 UNITS tablet Take 2,000 Units by mouth daily.        Marland Kitchen  CRESTOR 20 MG tablet Take 20 mg by mouth daily.       . diazepam (VALIUM) 5 MG tablet Take 5 mg by mouth every 6 (six) hours as needed.       Marland Kitchen esomeprazole (NEXIUM) 40 MG capsule Take 40 mg by mouth daily before breakfast.        . ezetimibe (ZETIA) 10 MG tablet Take 10 mg by mouth daily.        . furosemide (LASIX) 20 MG tablet Take 1 tablet (20 mg total) by mouth daily.  90 tablet  3  . HYDROcodone-acetaminophen (VICODIN) 5-500 MG per tablet Take 1 tablet by mouth every 6 (six) hours as needed.      Marland Kitchen levothyroxine (SYNTHROID, LEVOTHROID) 125 MCG tablet Take 125 mcg by mouth daily.        . metoprolol tartrate (LOPRESSOR) 50 MG tablet Take one and one-half tablet by mouth twice a day  270 tablet  3  . Multiple Vitamin (MULTIVITAMIN) capsule Take 1 capsule by mouth daily.        . NON FORMULARY daily. 15 treatments of radiation a day      . pioglitazone (ACTOS) 15 MG tablet Take 15 mg by mouth daily.        . potassium chloride SA (K-DUR,KLOR-CON) 10 MEQ tablet Take 1 tablet (10 mEq total) by mouth daily.  90 tablet  3  . rOPINIRole (REQUIP) 3 MG tablet Take 3 mg by mouth at bedtime.       . traMADol  (ULTRAM) 50 MG tablet Take 50 mg by mouth every 6 (six) hours as needed. Maximum dose= 8 tablets per day         OBJECTIVE: Middle-aged white woman in no acute distress Filed Vitals:   06/22/12 0910  BP: 164/71  Pulse: 67  Temp: 99 F (37.2 C)  Resp: 20     Body mass index is 33.99 kg/(m^2).    ECOG FS: 1 Filed Weights   06/22/12 0910  Weight: 198 lb (89.812 kg)   Sclerae unicteric Oropharynx clear No peripheral adenopathy Lungs no rales or rhonchi Heart regular rate and rhythm, no murmur appreciated Abd soft, nontender, with positive bowel sounds MSK no focal spinal tenderness, no peripheral edema Neuro: nonfocal, alert and oriented x3 Breasts: Right breast no suspicious findings; left breast status post  lumpectomy; no suspicious nodularity and no evidence of local recurrence  LAB RESULTS: Lab Results  Component Value Date   WBC 5.8 06/22/2012   NEUTROABS 3.7 06/22/2012   HGB 13.5 06/22/2012   HCT 39.9 06/22/2012   MCV 87.0 06/22/2012   PLT 241 06/22/2012      Chemistry      Component Value Date/Time   NA 140 06/22/2012 0855   NA 143 12/07/2011 0914   K 4.1 06/22/2012 0855   K 4.1 12/07/2011 0914   CL 110* 06/22/2012 0855   CL 108 12/07/2011 0914   CO2 21* 06/22/2012 0855   CO2 23 12/07/2011 0914   BUN 15.0 06/22/2012 0855   BUN 18 12/07/2011 0914   CREATININE 0.8 06/22/2012 0855   CREATININE 0.75 12/07/2011 0914      Component Value Date/Time   CALCIUM 9.3 06/22/2012 0855   CALCIUM 9.7 12/07/2011 0914   ALKPHOS 112 06/22/2012 0855   ALKPHOS 109 12/07/2011 0914   AST 28 06/22/2012 0855   AST 20 12/07/2011 0914   ALT 36 06/22/2012 0855   ALT 16 12/07/2011 0914   BILITOT 1.00 06/22/2012 0855  BILITOT 0.7 12/07/2011 0914       Lab Results  Component Value Date   LABCA2 13 12/07/2011     STUDIES:  Most recent bone density was in April 2013 at the breast center showing osteoporosis. Patient's next mammogram is due in February 2014.    ASSESSMENT: 78 y.o.   [place] woman,   (1)  status post left lumpectomy and sentinel lymph node sampling 11/21/2011 for a T1c N0, stage IA invasive ductal carcinoma, grade 3, strongly estrogen and progesterone receptor positive, with an MIB-1 of 47%, and no HER-2 amplification.   (2)  Completed radiation 01/10/2012 and started anastrozole June 2013  (3)  osteoporosis, receiving zoledronic acid annually, first dose given on 05/24/2012.  PLAN:  Clairessa continues to do well with regards to her breast cancer, and will continue on anastrozole as before. Fortunately, she also tolerated the zoledronic acid well, and we will repeat this once yearly as long as she is on the anastrozole.  She'll be scheduled for her next annual mammogram in early February, and will see Dr. Darnelle Catalan soon thereafter for repeat labs and physical exam. In the meanwhile she will continue on calcium and vitamin D, and I have encouraged her to walk on a regular basis. She will call us with any changes or problems.     [name]    06/22/2012

## 2012-06-22 NOTE — Telephone Encounter (Signed)
gve the pt her feb 2014 appt calendar along with the mammo appt at the [facility].

## 2012-07-18 ENCOUNTER — Encounter: Payer: Self-pay | Admitting: Cardiology

## 2012-07-18 ENCOUNTER — Ambulatory Visit (INDEPENDENT_AMBULATORY_CARE_PROVIDER_SITE_OTHER): Payer: Medicare Other | Admitting: Cardiology

## 2012-07-18 VITALS — BP 162/74 | HR 72 | Ht 64.0 in | Wt 196.0 lb

## 2012-07-18 DIAGNOSIS — E785 Hyperlipidemia, unspecified: Secondary | ICD-10-CM

## 2012-07-18 DIAGNOSIS — I2510 Atherosclerotic heart disease of native coronary artery without angina pectoris: Secondary | ICD-10-CM | POA: Diagnosis not present

## 2012-07-18 DIAGNOSIS — I10 Essential (primary) hypertension: Secondary | ICD-10-CM | POA: Diagnosis not present

## 2012-07-18 NOTE — Patient Instructions (Addendum)
Your physician wants you to follow-up in: 1 YEAR with Dr McAlhany.  You will receive a reminder letter in the mail two months in advance. If you don't receive a letter, please call our office to schedule the follow-up appointment.  Your physician recommends that you continue on your current medications as directed. Please refer to the Current Medication list given to you today.  

## 2012-08-03 ENCOUNTER — Other Ambulatory Visit: Payer: Self-pay | Admitting: *Deleted

## 2012-08-03 DIAGNOSIS — R609 Edema, unspecified: Secondary | ICD-10-CM

## 2012-08-03 DIAGNOSIS — I2510 Atherosclerotic heart disease of native coronary artery without angina pectoris: Secondary | ICD-10-CM

## 2012-08-03 MED ORDER — POTASSIUM CHLORIDE CRYS ER 10 MEQ PO TBCR: 10.0000 meq | EXTENDED_RELEASE_TABLET | Freq: Every day | ORAL | Status: DC

## 2012-08-03 MED ORDER — FUROSEMIDE 20 MG PO TABS: 20.0000 mg | ORAL_TABLET | Freq: Every day | ORAL | Status: DC

## 2012-08-03 MED ORDER — METOPROLOL TARTRATE 50 MG PO TABS: ORAL_TABLET | ORAL | Status: DC

## 2012-08-05 NOTE — Assessment & Plan Note (Signed)
Modestly elevated.  Need to check on regular basis at home.

## 2012-08-05 NOTE — Assessment & Plan Note (Signed)
Treated by Dr. Leslie Dales.

## 2012-08-05 NOTE — Assessment & Plan Note (Signed)
Remains stable.  Will have her follow with one of my colleagues in one year.

## 2012-08-05 NOTE — Assessment & Plan Note (Signed)
Reasonable control.  Needs to check at home on a regular basis.

## 2012-08-05 NOTE — Progress Notes (Signed)
HPI:  Doing well overall from a cardiac perspective.  Denies chest pain.  Continues follow up at the [facility] for breast CA.  Has estrogen pos disease and is on arimidex for this.  Dr. Leslie Dales has her on rosuvastatin.    Current Outpatient Prescriptions  Medication Sig Dispense Refill  . anastrozole (ARIMIDEX) 1 MG tablet       . aspirin 81 MG tablet Take 81 mg by mouth daily.        . cetirizine (ZYRTEC) 10 MG tablet Take 10 mg by mouth as needed.       . Cholecalciferol (VITAMIN D-3) 5000 UNITS TABS Take 1 tablet by mouth daily.      . CRESTOR 20 MG tablet Take 20 mg by mouth daily.       . diazepam (VALIUM) 5 MG tablet Take 5 mg by mouth every 6 (six) hours as needed.       Marland Kitchen esomeprazole (NEXIUM) 40 MG capsule Take 40 mg by mouth daily before breakfast.        . ezetimibe (ZETIA) 10 MG tablet Take 10 mg by mouth daily.        Marland Kitchen HYDROcodone-acetaminophen (VICODIN) 5-500 MG per tablet Take 1 tablet by mouth every 6 (six) hours as needed.      Marland Kitchen levothyroxine (SYNTHROID, LEVOTHROID) 125 MCG tablet Take 125 mcg by mouth daily.        . Multiple Vitamin (MULTIVITAMIN) capsule Take 1 capsule by mouth daily.        . pioglitazone (ACTOS) 15 MG tablet Take 15 mg by mouth daily.        Marland Kitchen rOPINIRole (REQUIP) 3 MG tablet Take 3 mg by mouth at bedtime.       . traMADol (ULTRAM) 50 MG tablet Take 50 mg by mouth every 6 (six) hours as needed. Maximum dose= 8 tablets per day       . furosemide (LASIX) 20 MG tablet Take 1 tablet (20 mg total) by mouth daily.  90 tablet  3  . metoprolol (LOPRESSOR) 50 MG tablet Take one and one-half tablet by mouth twice a day  270 tablet  3  . potassium chloride (K-DUR,KLOR-CON) 10 MEQ tablet Take 1 tablet (10 mEq total) by mouth daily.  90 tablet  3    No Known Allergies  Past Medical History  Diagnosis Date  . Diverticulitis of colon with perforation   . S/P CABG x 2 06/2011    Dr. Tyrone Sage (L-LAD, [facility])  . Carotid stenosis     dopplers 10/12:  LICA 40-59%  . DM2 (diabetes mellitus, type 2)   . HTN (hypertension)   . HLD (hyperlipidemia)   . Hypothyroidism   . RLS (restless legs syndrome)   . Breast cancer, left breast 11/01/2011  . CAD (coronary artery disease)     LHC 06/14/11: LAD 80% and 80% after Dx, Dx 80% (Dx was small), oOM 80%, mOM 70% (OM was small), oRCA 70-80%, mRCA 60-70%, EF 55% - referred for CABG;   Echo 06/14/11: mild focal basal septal hypertrophy, grade 1 diast dysfxn, mild LAE, mild RVE, PASP 37.    . Kidney stones   . GERD (gastroesophageal reflux disease)   . Psoriasis   . Diabetes mellitus     x 5-6 yrs  . Breast cancer 10/20/11    L breast, ER/PR+, HER2 -  . S/P radiation therapy 12/20/11 - 01/10/12    Left Breast: 4256 cGy/ 16 Fractions  Past Surgical History  Procedure Date  . Colon resection 1977  . Arthroscopy right knee   . Off-pump coronary artery bypass grafting x2 06/16/2011  . Vein bypass surgery 1999  . Colon surgery     part colectomy-  . Appendectomy   . Coronary artery bypass graft 10/12  . Breast biospy 11/04/11    Breast, Left, Needle Core Biopsy 6 Oclock, Benign Breast Tissue wtih Fibrocystic  Changes: Microcalcifications Identified, No Neoplasm or Malignancy Identified  . Breast biospy 10/20/11    Breast, Left, Needle Core Biopsy LOQ: Invsive ductal Carcinoman  . Breast lumpectomy 11/22/11    Breast, Lumpectomy, Left with Sentinel Node Biopsy: Invasive High Grade Ductal Carcinoma : No lymphovascular Invasion: High  Grade Ductal Carcinoma In-Situ with Calcification and Comedo Necrosis  . Abdominal hysterectomy     age 78's    Family History  Problem Relation Age of Onset  . Cancer Mother     throat  . Hypertension Brother   . Cancer Paternal Uncle     History   Social History  . Marital Status: Married    Spouse Name: N/A    Number of Children: N/A  . Years of Education: N/A   Occupational History  . Not on file.   Social History Main Topics  . Smoking status: Former  Smoker -- 1.0 packs/day for 6 years    Quit date: 09/05/1980  . Smokeless tobacco: Not on file  . Alcohol Use: Yes     Comment: socially  . Drug Use: No  . Sexually Active: Not on file     Comment: menarche 13, p2, 1 stillbirth, 1st preg age 89, HRT x many yrs   Other Topics Concern  . Not on file   Social History Narrative  . No narrative on file    ROS: Please see the HPI.  All other systems reviewed and negative.  PHYSICAL EXAM:  BP 162/74  Pulse 72  Ht 5\' 4"  (1.626 m)  Wt 196 lb (88.905 kg)  BMI 33.64 kg/m2  General: Well developed, well nourished, in no acute distress. Head:  Normocephalic and atraumatic. Neck: no JVD Lungs: Clear to auscultation and percussion. Heart: paradoxical S2. Normal S1.  No definite murmur.   Pulses: Pulses normal in all 4 extremities. Extremities: No clubbing or cyanosis. No edema. Neurologic: Alert and oriented x 3.  EKG:  ASSESSMENT AND PLAN:

## 2012-08-20 ENCOUNTER — Other Ambulatory Visit: Payer: Self-pay | Admitting: *Deleted

## 2012-08-20 DIAGNOSIS — I2510 Atherosclerotic heart disease of native coronary artery without angina pectoris: Secondary | ICD-10-CM

## 2012-08-20 DIAGNOSIS — R609 Edema, unspecified: Secondary | ICD-10-CM

## 2012-08-20 MED ORDER — METOPROLOL TARTRATE 50 MG PO TABS: ORAL_TABLET | ORAL | Status: DC

## 2012-08-28 ENCOUNTER — Telehealth: Payer: Self-pay | Admitting: *Deleted

## 2012-08-28 NOTE — Telephone Encounter (Signed)
Per the sticky note change the patient to amy berry on 10-17-2012 starting at 9:45am with labs

## 2012-09-10 DIAGNOSIS — E119 Type 2 diabetes mellitus without complications: Secondary | ICD-10-CM | POA: Diagnosis not present

## 2012-09-10 DIAGNOSIS — E039 Hypothyroidism, unspecified: Secondary | ICD-10-CM | POA: Diagnosis not present

## 2012-09-10 DIAGNOSIS — M549 Dorsalgia, unspecified: Secondary | ICD-10-CM | POA: Diagnosis not present

## 2012-09-10 DIAGNOSIS — E785 Hyperlipidemia, unspecified: Secondary | ICD-10-CM | POA: Diagnosis not present

## 2012-09-10 DIAGNOSIS — M5430 Sciatica, unspecified side: Secondary | ICD-10-CM | POA: Diagnosis not present

## 2012-09-10 DIAGNOSIS — I10 Essential (primary) hypertension: Secondary | ICD-10-CM | POA: Diagnosis not present

## 2012-09-29 IMAGING — CR DG CHEST 1V PORT
1 series · 1 of 1 positions shown · non-contrast
Comparison: 06/15/2011; 06/12/2011; 05/17/2010

CLINICAL DATA: Post CABG

PORTABLE CHEST - 1 VIEW

[view not recorded]
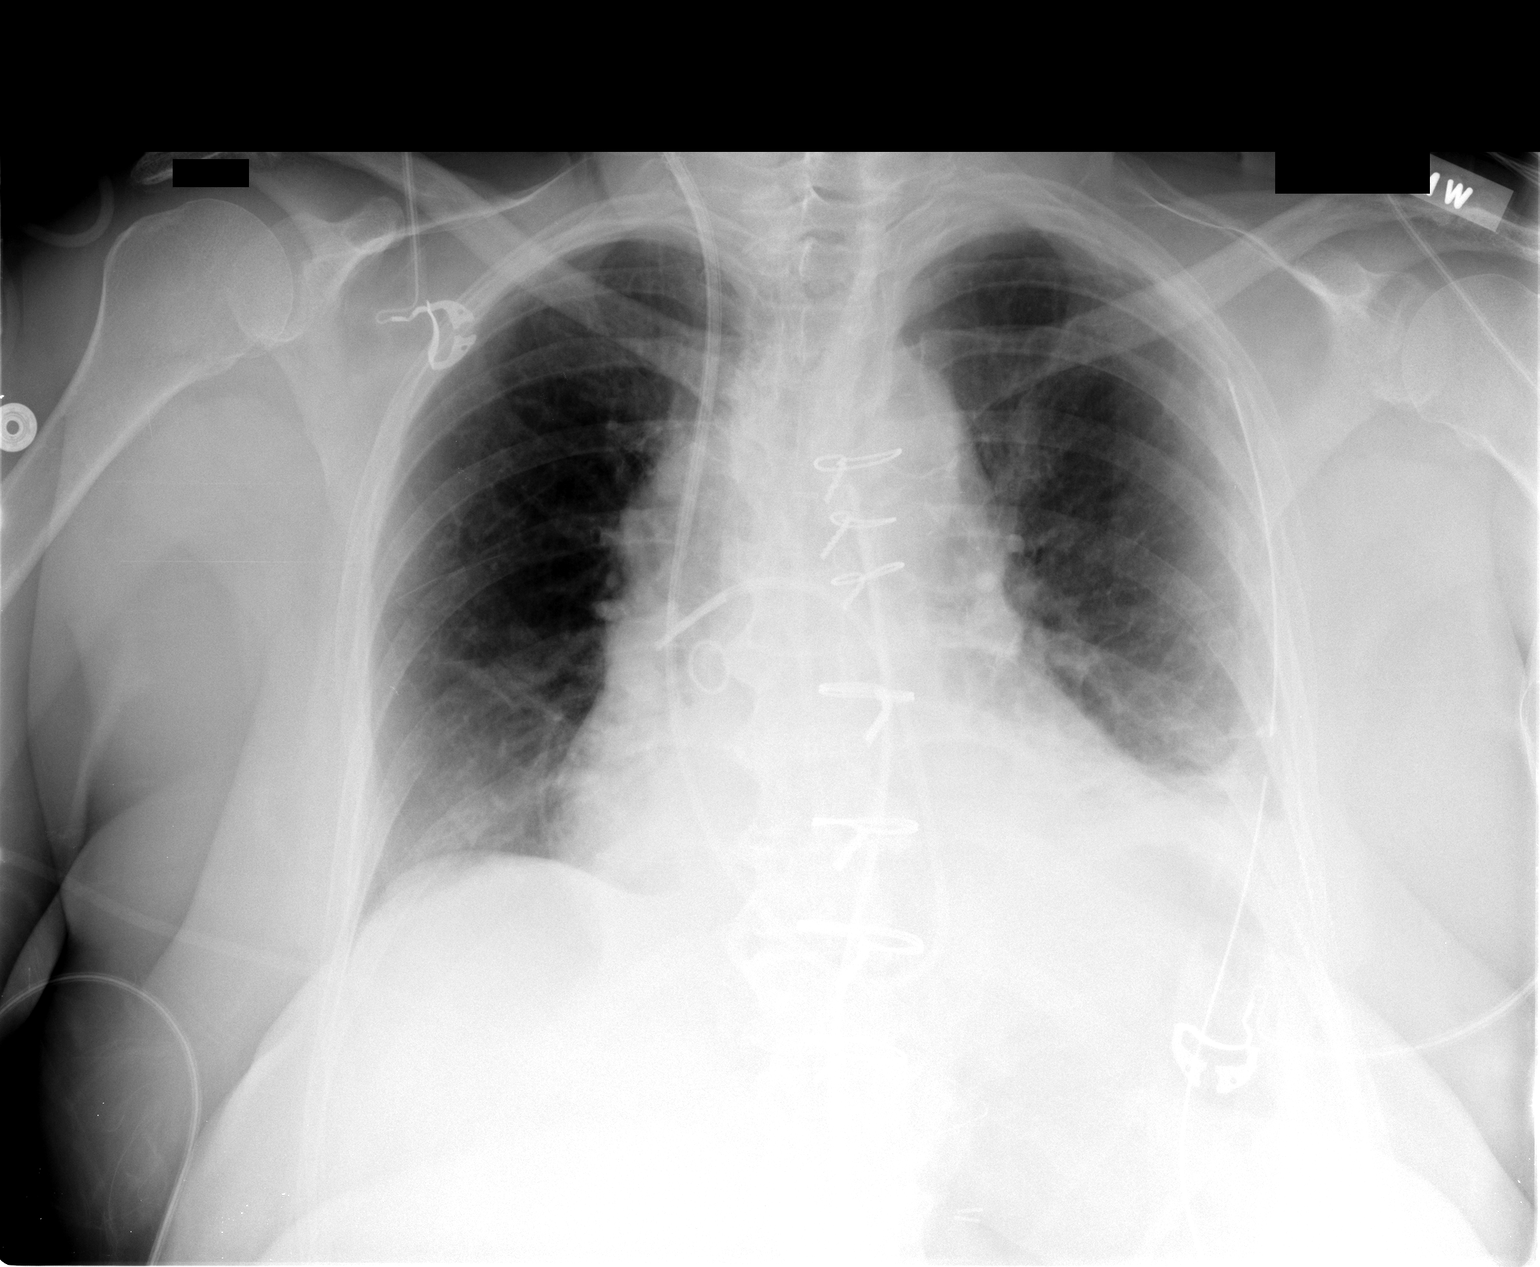

[1 of 1 positions shown; findings below may reference images not displayed]

FINDINGS: Grossly unchanged borderline enlarged cardiac silhouette
and mediastinal contours post median sternotomy and CABG.  Interval
extubation and removal of enteric tube.  Unchanged position of
right jugular approach P A catheter with tip overlying the right
main pulmonary artery.  Grossly unchanged positioning of the left-
sided chest tubes and mediastinal drains.  No definite
pneumothorax.  There is blunting of the left costophrenic angle
which may suggest a small left-sided effusion.  Grossly unchanged
minimal bibasilar heterogeneous opacities, left greater than right.
Minimal improved aeration of the right upper and mid lungs
suggestive of improving pulmonary edema. Mild gas distension of the
stomach.  Unchanged bones.
IMPRESSION: 1.  Interval extubation and removal of enteric tube.  Otherwise,
stable support apparatus.  No pneumothorax.
2.  Findings suggestive of improving pulmonary edema.
3.  Grossly unchanged findings of likely small left-sided effusion
and bibasilar opacities, left greater than right, likely
atelectasis.

## 2012-10-11 ENCOUNTER — Other Ambulatory Visit: Payer: Medicare Other | Admitting: Lab

## 2012-10-11 ENCOUNTER — Ambulatory Visit
Admission: RE | Admit: 2012-10-11 | Discharge: 2012-10-11 | Disposition: A | Discharge status: Home / Self Care | Payer: Medicare Other | Source: Ambulatory Visit | Attending: Physician Assistant | Admitting: Physician Assistant

## 2012-10-11 DIAGNOSIS — Z853 Personal history of malignant neoplasm of breast: Secondary | ICD-10-CM

## 2012-10-11 DIAGNOSIS — M5137 Other intervertebral disc degeneration, lumbosacral region: Secondary | ICD-10-CM | POA: Diagnosis not present

## 2012-10-17 ENCOUNTER — Encounter: Payer: Self-pay | Admitting: Physician Assistant

## 2012-10-17 ENCOUNTER — Telehealth: Payer: Self-pay | Admitting: Oncology

## 2012-10-17 ENCOUNTER — Ambulatory Visit (HOSPITAL_BASED_OUTPATIENT_CLINIC_OR_DEPARTMENT_OTHER): Payer: Medicare Other | Admitting: Physician Assistant

## 2012-10-17 ENCOUNTER — Other Ambulatory Visit (HOSPITAL_BASED_OUTPATIENT_CLINIC_OR_DEPARTMENT_OTHER): Payer: Medicare Other | Admitting: Lab

## 2012-10-17 VITALS — BP 154/65 | HR 74 | Temp 98.4°F | Resp 20 | Ht 64.0 in | Wt 195.1 lb

## 2012-10-17 DIAGNOSIS — M810 Age-related osteoporosis without current pathological fracture: Secondary | ICD-10-CM

## 2012-10-17 DIAGNOSIS — C50912 Malignant neoplasm of unspecified site of left female breast: Secondary | ICD-10-CM

## 2012-10-17 DIAGNOSIS — Z170 Estrogen receptor positive status [ER+]: Secondary | ICD-10-CM | POA: Diagnosis not present

## 2012-10-17 DIAGNOSIS — C50519 Malignant neoplasm of lower-outer quadrant of unspecified female breast: Secondary | ICD-10-CM

## 2012-10-17 DIAGNOSIS — C50919 Malignant neoplasm of unspecified site of unspecified female breast: Secondary | ICD-10-CM | POA: Diagnosis not present

## 2012-10-17 LAB — CBC WITH DIFFERENTIAL/PLATELET
BASO%: 0.3 % (ref 0.0–2.0)
Eosinophils Absolute: 0.1 10*3/uL (ref 0.0–0.5)
MONO#: 0.4 10*3/uL (ref 0.1–0.9)
NEUT#: 3.9 10*3/uL (ref 1.5–6.5)
RBC: 4.9 10*6/uL (ref 3.70–5.45)
RDW: 14.2 % (ref 11.2–14.5)
WBC: 5.9 10*3/uL (ref 3.9–10.3)
lymph#: 1.5 10*3/uL (ref 0.9–3.3)

## 2012-10-17 LAB — COMPREHENSIVE METABOLIC PANEL (CC13)
ALT: 35 U/L (ref 0–55)
AST: 29 U/L (ref 5–34)
CO2: 27 mEq/L (ref 22–29)
Calcium: 10 mg/dL (ref 8.4–10.4)
Chloride: 105 mEq/L (ref 98–107)
Sodium: 140 mEq/L (ref 136–145)
Total Protein: 7 g/dL (ref 6.4–8.3)

## 2012-10-17 NOTE — Progress Notes (Signed)
ID: [name]   DOB: April 06, 1933  MR#: [id number]  JYN#:[id number]  HISTORY OF PRESENT ILLNESS: The patient was noted to have left breast calcifications which have been followed on a every 6 month basis, most recently 10/11/2011. Bilateral diagnostic mammography on that date showed a mass in the medial left breast and a separate area of linear calcifications. The right breast was unremarkable. Ultrasound showed a simple cysts correlating with the mass seen on mammography. The area of abnormal calcifications in the lower outer quadrant was biopsied 10/20/2011 and showed ([id number]) and invasive ductal carcinoma, which appeared low to intermediate grade, and which was estrogen receptor positive at 98%, progesterone receptor positive at 54%, with no HER-2 amplification, and with an MIB-1 of 47%  On 10/27/2011 the patient underwent bilateral breast MRIs. This showed post biopsy change but also a 3 mm focus of enhancement in the lateral aspect of the biopsy cavity. There was no MRI evidence for multifocal, or contralateral malignancy or for lymph node spread. Accordingly, on 11/21/2011 the patient underwent left lumpectomy and sentinel lymph node sampling under Dr. Corliss Skains. This showed ([id number]) high-grade invasive ductal carcinoma, measuring 1.1 cm, with 0 of 2 lymph nodes negative, and ample margins. Repeat HER-2 testing again showed no amplification.  Her subsequent history is as detailed below.  INTERVAL HISTORY:  [name] returns for routine followup of her left breast carcinoma. She continues on anastrozole which she is tolerating very well. She continues to have some occasional hot flashes in the morning "if rushing to get dressed" but these are not particularly problematic for her. She's had no increased arthralgias associated with the anastrozole. She denies vaginal dryness.  She is having quite a bit of lower back pain, often radiating down into the right leg and this is being followed by Dr.   Ethelene Hal.  In fact, she scheduled for an epidural injection on February 25.  Due to the back pain and leg pain, she's been unable to exercise much in the last several months. She is concerned about her weight, and hopes that the epidural relieve the pain enough that she can begin walking again on a regular basis.  REVIEW OF SYSTEMS: [name] has had no recent illnesses and denies any fevers, chills, or night sweats. She's eating and drinking well denies any nausea or change in bowel habits.  She has no new cough or phlegm production and no increased shortness of breath. She's had no chest pain or palpitations. She denies any abnormal headaches, dizziness, or change in vision. She's had no peripheral swelling.  A detailed review of systems is otherwise stable and noncontributory.   PAST MEDICAL HISTORY: Past Medical History  Diagnosis Date  . Diverticulitis of colon with perforation   . S/P CABG x 2 06/2011    Dr. Tyrone Sage (L-LAD, [facility])  . Carotid stenosis     dopplers 10/12: LICA 40-59%  . DM2 (diabetes mellitus, type 2)   . HTN (hypertension)   . HLD (hyperlipidemia)   . Hypothyroidism   . RLS (restless legs syndrome)   . Breast cancer, left breast 11/01/2011  . CAD (coronary artery disease)     LHC 06/14/11: LAD 80% and 80% after Dx, Dx 80% (Dx was small), oOM 80%, mOM 70% (OM was small), oRCA 70-80%, mRCA 60-70%, EF 55% - referred for CABG;   Echo 06/14/11: mild focal basal septal hypertrophy, grade 1 diast dysfxn, mild LAE, mild RVE, PASP 37.    . Kidney stones   . GERD (gastroesophageal  reflux disease)   . Psoriasis   . Diabetes mellitus     x 5-6 yrs  . Breast cancer 10/20/11    L breast, ER/PR+, HER2 -  . S/P radiation therapy 12/20/11 - 01/10/12    Left Breast: 4256 cGy/ 16 Fractions    PAST SURGICAL HISTORY: Past Surgical History  Procedure Laterality Date  . Colon resection  1977  . Arthroscopy right knee    . Off-pump coronary artery bypass grafting x2  06/16/2011  . Vein  bypass surgery  1999  . Colon surgery      part colectomy-  . Appendectomy    . Coronary artery bypass graft  10/12  . Breast biospy  11/04/11    Breast, Left, Needle Core Biopsy 6 Oclock, Benign Breast Tissue wtih Fibrocystic  Changes: Microcalcifications Identified, No Neoplasm or Malignancy Identified  . Breast biospy  10/20/11    Breast, Left, Needle Core Biopsy LOQ: Invsive ductal Carcinoman  . Breast lumpectomy  11/22/11    Breast, Lumpectomy, Left with Sentinel Node Biopsy: Invasive High Grade Ductal Carcinoma : No lymphovascular Invasion: High  Grade Ductal Carcinoma In-Situ with Calcification and Comedo Necrosis  . Abdominal hysterectomy      age 56's    FAMILY HISTORY Family History  Problem Relation Age of Onset  . Cancer Mother     throat  . Hypertension Brother   . Cancer Paternal Uncle    the patient's father died at the age of 48 from a subarachnoid hemorrhage. The patient's mother died at the age of 47 from congestive heart failure. She had a history of throat cancer. The patient has one brother, currently 36 years old. She had no sisters. There is no history of breast or ovarian cancer in the family  GYNECOLOGIC HISTORY: She had menarche age 71. She is GX P2, first pregnancy to term age 47. She underwent hysterectomy around age 33 to do to menorrhagia. She took Premarin as a patch for many years, discontinued about 5 years ago  SOCIAL HISTORY: She worked as a Catering manager in a Therapist, art. Her husband of 58 years, [name] (goes by [name]) Used to work for [employer]. Their son [name] lives in [place] and works for [employer]. The patient's other child was stillborn. She has no grandchildren. She belongs to the [employer] and wants me to know that on Saturdays starting in May from 8 AM to 2:30 PM they have a hot dog sale that is considered the best in [place] (actually featured in the [employer] this month)  ADVANCED  DIRECTIVES:  HEALTH MAINTENANCE: History  Substance Use Topics  . Smoking status: Former Smoker -- 1.00 packs/day for 6 years    Quit date: 09/05/1980  . Smokeless tobacco: Not on file  . Alcohol Use: Yes     Comment: socially     Colonoscopy: "about 2007, no need to repeat"  PAP: s/p hysterectomy  Bone density: At the breast center 09/12/2008, showing significant osteopenia  Lipid panel: Under treatment  No Known Allergies  Current Outpatient Prescriptions  Medication Sig Dispense Refill  . anastrozole (ARIMIDEX) 1 MG tablet       . aspirin 81 MG tablet Take 81 mg by mouth daily.        Marland Kitchen CALCIUM PO Take 2 capsules by mouth at bedtime.      . cetirizine (ZYRTEC) 10 MG tablet Take 10 mg by mouth as needed.       Marland Kitchen  Cholecalciferol (VITAMIN D-3) 5000 UNITS TABS Take 1 tablet by mouth daily.      . CRESTOR 20 MG tablet Take 20 mg by mouth daily.       Marland Kitchen esomeprazole (NEXIUM) 40 MG capsule Take 40 mg by mouth daily before breakfast.        . ezetimibe (ZETIA) 10 MG tablet Take 10 mg by mouth daily.        . furosemide (LASIX) 20 MG tablet Take 1 tablet (20 mg total) by mouth daily.  90 tablet  3  . HYDROcodone-acetaminophen (VICODIN) 5-500 MG per tablet Take 1 tablet by mouth every 6 (six) hours as needed.      Marland Kitchen levothyroxine (SYNTHROID, LEVOTHROID) 125 MCG tablet Take 125 mcg by mouth daily.        . metoprolol (LOPRESSOR) 50 MG tablet Take one and one-half tablet by mouth twice a day  270 tablet  3  . Multiple Vitamin (MULTIVITAMIN) capsule Take 1 capsule by mouth daily.        . pioglitazone (ACTOS) 15 MG tablet Take 15 mg by mouth daily.        . potassium chloride (K-DUR,KLOR-CON) 10 MEQ tablet Take 1 tablet (10 mEq total) by mouth daily.  90 tablet  3  . rOPINIRole (REQUIP) 3 MG tablet Take 3 mg by mouth at bedtime.       . traMADol (ULTRAM) 50 MG tablet Take 50 mg by mouth every 6 (six) hours as needed. Maximum dose= 8 tablets per day       . diazepam (VALIUM) 5 MG tablet  Take 5 mg by mouth every 6 (six) hours as needed.        No current facility-administered medications for this visit.    OBJECTIVE: Middle-aged white woman in no acute distress Filed Vitals:   10/17/12 0947  BP: 154/65  Pulse: 74  Temp: 98.4 F (36.9 C)  Resp: 20     Body mass index is 33.47 kg/(m^2).    ECOG FS: 1 Filed Weights   10/17/12 0947  Weight: 195 lb 1.6 oz (88.497 kg)   Sclerae unicteric Oropharynx clear No peripheral adenopathy Lungs clear to auscultation bilaterally no rales or rhonchi Heart regular rate and rhythm, no murmur appreciated Abdomen soft, nontender, with positive bowel sounds MSK no focal spinal tenderness to gentle palpation, no peripheral edema Neuro: nonfocal, well oriented with positive affect Breasts: Right breast was unremarkable; left breast status post  lumpectomy; no suspicious nodularity and no evidence of local recurrence.  LAB RESULTS: Lab Results  Component Value Date   WBC 5.9 10/17/2012   NEUTROABS 3.9 10/17/2012   HGB 14.3 10/17/2012   HCT 41.4 10/17/2012   MCV 84.4 10/17/2012   PLT 238 10/17/2012      Chemistry      Component Value Date/Time   NA 140 10/17/2012 0912   NA 143 12/07/2011 0914   K 3.8 10/17/2012 0912   K 4.1 12/07/2011 0914   CL 105 10/17/2012 0912   CL 108 12/07/2011 0914   CO2 27 10/17/2012 0912   CO2 23 12/07/2011 0914   BUN 13.5 10/17/2012 0912   BUN 18 12/07/2011 0914   CREATININE 0.9 10/17/2012 0912   CREATININE 0.75 12/07/2011 0914      Component Value Date/Time   CALCIUM 10.0 10/17/2012 0912   CALCIUM 9.7 12/07/2011 0914   ALKPHOS 106 10/17/2012 0912   ALKPHOS 109 12/07/2011 0914   AST 29 10/17/2012 0912   AST 20 12/07/2011  0914   ALT 35 10/17/2012 0912   ALT 16 12/07/2011 0914   BILITOT 0.98 10/17/2012 0912   BILITOT 0.7 12/07/2011 0914       Lab Results  Component Value Date   LABCA2 8 06/22/2012     STUDIES:  Most recent bone density was in April 2013 at the breast center showing osteoporosis.   Most recent  mammogram on 10/11/2012 was unremarkable.    ASSESSMENT: 79 y.o.  [place] woman,   (1)  status post left lumpectomy and sentinel lymph node sampling 11/21/2011 for a T1c N0, stage IA invasive ductal carcinoma, grade 3, strongly estrogen and progesterone receptor positive, with an MIB-1 of 47%, and no HER-2 amplification.   (2)  Completed radiation 01/10/2012 and started anastrozole June 2013  (3)  osteoporosis, receiving zoledronic acid annually, first dose given on 05/24/2012.  PLAN:  Telly continues to do well with regards to her breast cancer, and will continue on anastrozole as before.  She'll return to see [place] in 6 months, at which time we will schedule her for her next annual dose of zoledronic acid which would be due in September.   Kashlynn voices understanding and agreement with our plan, and will call with any changes or problems.     Ylianna Almanzar    10/17/2012

## 2012-10-17 NOTE — Telephone Encounter (Signed)
gv pt appt schedule for [place],

## 2012-10-18 LAB — CANCER ANTIGEN 27.29: CA 27.29: 14 U/mL (ref 0–39)

## 2012-10-18 LAB — VITAMIN D 25 HYDROXY (VIT D DEFICIENCY, FRACTURES): Vit D, 25-Hydroxy: 77 ng/mL (ref 30–89)

## 2012-11-13 DIAGNOSIS — E039 Hypothyroidism, unspecified: Secondary | ICD-10-CM | POA: Diagnosis not present

## 2012-11-13 DIAGNOSIS — E785 Hyperlipidemia, unspecified: Secondary | ICD-10-CM | POA: Diagnosis not present

## 2012-11-13 DIAGNOSIS — E119 Type 2 diabetes mellitus without complications: Secondary | ICD-10-CM | POA: Diagnosis not present

## 2012-11-15 DIAGNOSIS — E785 Hyperlipidemia, unspecified: Secondary | ICD-10-CM | POA: Diagnosis not present

## 2012-11-15 DIAGNOSIS — E119 Type 2 diabetes mellitus without complications: Secondary | ICD-10-CM | POA: Diagnosis not present

## 2012-11-15 DIAGNOSIS — E039 Hypothyroidism, unspecified: Secondary | ICD-10-CM | POA: Diagnosis not present

## 2012-11-15 DIAGNOSIS — I10 Essential (primary) hypertension: Secondary | ICD-10-CM | POA: Diagnosis not present

## 2012-11-27 ENCOUNTER — Telehealth: Payer: Self-pay | Admitting: *Deleted

## 2012-11-27 NOTE — Telephone Encounter (Signed)
Lm gave appts d/t also made pt aware that i will mail a letter/cal.

## 2012-12-13 ENCOUNTER — Ambulatory Visit (INDEPENDENT_AMBULATORY_CARE_PROVIDER_SITE_OTHER): Payer: Medicare Other | Admitting: Surgery

## 2012-12-13 ENCOUNTER — Encounter (INDEPENDENT_AMBULATORY_CARE_PROVIDER_SITE_OTHER): Payer: Self-pay | Admitting: Surgery

## 2012-12-13 VITALS — BP 155/83 | HR 70 | Temp 97.8°F | Resp 12 | Ht 64.0 in | Wt 194.4 lb

## 2012-12-13 DIAGNOSIS — C50912 Malignant neoplasm of unspecified site of left female breast: Secondary | ICD-10-CM

## 2012-12-13 DIAGNOSIS — C50919 Malignant neoplasm of unspecified site of unspecified female breast: Secondary | ICD-10-CM | POA: Diagnosis not present

## 2012-12-13 NOTE — Progress Notes (Signed)
[name] status post lumpectomy and sentinel lymph node biopsy 11/21/11. She has completed radiation and has been on Arimidex for 10 months.  She seems to be tolerating this well. Recent mammogram was unremarkable. She has been having a lot of back problems but has received some relief after a recent back injection.  On examination her left axillary and left lumpectomy incisions are well-healed with no sign of infection. No palpable masses in either breast. No palpable lymphadenopathy. No nipple retraction or nipple discharge.  The patient is seeing the oncologist every 6 months. We will reevaluate her in one year. Continue with annual mammograms.  [name]. Corliss Skains, MD, [facility]  12/13/2012 10:30 AM

## 2012-12-28 DIAGNOSIS — D1801 Hemangioma of skin and subcutaneous tissue: Secondary | ICD-10-CM | POA: Diagnosis not present

## 2012-12-28 DIAGNOSIS — L819 Disorder of pigmentation, unspecified: Secondary | ICD-10-CM | POA: Diagnosis not present

## 2012-12-28 DIAGNOSIS — L821 Other seborrheic keratosis: Secondary | ICD-10-CM | POA: Diagnosis not present

## 2012-12-28 DIAGNOSIS — L820 Inflamed seborrheic keratosis: Secondary | ICD-10-CM | POA: Diagnosis not present

## 2012-12-28 DIAGNOSIS — L408 Other psoriasis: Secondary | ICD-10-CM | POA: Diagnosis not present

## 2012-12-28 DIAGNOSIS — D239 Other benign neoplasm of skin, unspecified: Secondary | ICD-10-CM | POA: Diagnosis not present

## 2013-01-30 DIAGNOSIS — M47817 Spondylosis without myelopathy or radiculopathy, lumbosacral region: Secondary | ICD-10-CM | POA: Diagnosis not present

## 2013-01-30 DIAGNOSIS — M5137 Other intervertebral disc degeneration, lumbosacral region: Secondary | ICD-10-CM | POA: Diagnosis not present

## 2013-02-08 DIAGNOSIS — M47817 Spondylosis without myelopathy or radiculopathy, lumbosacral region: Secondary | ICD-10-CM | POA: Diagnosis not present

## 2013-02-15 DIAGNOSIS — M47817 Spondylosis without myelopathy or radiculopathy, lumbosacral region: Secondary | ICD-10-CM | POA: Diagnosis not present

## 2013-02-22 DIAGNOSIS — M545 Low back pain: Secondary | ICD-10-CM | POA: Diagnosis not present

## 2013-02-28 DIAGNOSIS — M19049 Primary osteoarthritis, unspecified hand: Secondary | ICD-10-CM | POA: Diagnosis not present

## 2013-03-01 DIAGNOSIS — L408 Other psoriasis: Secondary | ICD-10-CM | POA: Diagnosis not present

## 2013-04-01 DIAGNOSIS — E119 Type 2 diabetes mellitus without complications: Secondary | ICD-10-CM | POA: Diagnosis not present

## 2013-04-01 DIAGNOSIS — H2510 Age-related nuclear cataract, unspecified eye: Secondary | ICD-10-CM | POA: Diagnosis not present

## 2013-04-03 DIAGNOSIS — H2510 Age-related nuclear cataract, unspecified eye: Secondary | ICD-10-CM | POA: Diagnosis not present

## 2013-04-04 ENCOUNTER — Other Ambulatory Visit (HOSPITAL_BASED_OUTPATIENT_CLINIC_OR_DEPARTMENT_OTHER): Payer: Medicare Other | Admitting: Lab

## 2013-04-04 DIAGNOSIS — C50912 Malignant neoplasm of unspecified site of left female breast: Secondary | ICD-10-CM

## 2013-04-04 DIAGNOSIS — C50919 Malignant neoplasm of unspecified site of unspecified female breast: Secondary | ICD-10-CM

## 2013-04-04 LAB — COMPREHENSIVE METABOLIC PANEL (CC13)
ALT: 38 U/L (ref 0–55)
Albumin: 3.6 g/dL (ref 3.5–5.0)
Alkaline Phosphatase: 106 U/L (ref 40–150)
CO2: 25 mEq/L (ref 22–29)
Potassium: 4 mEq/L (ref 3.5–5.1)
Sodium: 142 mEq/L (ref 136–145)
Total Bilirubin: 0.86 mg/dL (ref 0.20–1.20)
Total Protein: 6.8 g/dL (ref 6.4–8.3)

## 2013-04-04 LAB — CBC WITH DIFFERENTIAL/PLATELET
BASO%: 0.6 % (ref 0.0–2.0)
Basophils Absolute: 0 10*3/uL (ref 0.0–0.1)
EOS%: 2.1 % (ref 0.0–7.0)
HCT: 41.2 % (ref 34.8–46.6)
MCH: 29.8 pg (ref 25.1–34.0)
MCHC: 34.4 g/dL (ref 31.5–36.0)
MCV: 86.6 fL (ref 79.5–101.0)
MONO%: 6.1 % (ref 0.0–14.0)
NEUT%: 61.4 % (ref 38.4–76.8)
RDW: 13.7 % (ref 11.2–14.5)
lymph#: 1.8 10*3/uL (ref 0.9–3.3)

## 2013-04-09 DIAGNOSIS — E119 Type 2 diabetes mellitus without complications: Secondary | ICD-10-CM | POA: Diagnosis not present

## 2013-04-09 DIAGNOSIS — H2510 Age-related nuclear cataract, unspecified eye: Secondary | ICD-10-CM | POA: Diagnosis not present

## 2013-04-09 DIAGNOSIS — H35369 Drusen (degenerative) of macula, unspecified eye: Secondary | ICD-10-CM | POA: Diagnosis not present

## 2013-04-11 ENCOUNTER — Other Ambulatory Visit: Payer: Medicare Other

## 2013-04-11 ENCOUNTER — Ambulatory Visit (HOSPITAL_BASED_OUTPATIENT_CLINIC_OR_DEPARTMENT_OTHER): Payer: Medicare Other | Admitting: Oncology

## 2013-04-11 VITALS — BP 176/78 | HR 59 | Temp 97.7°F | Resp 20 | Ht 64.0 in | Wt 197.9 lb

## 2013-04-11 DIAGNOSIS — C50919 Malignant neoplasm of unspecified site of unspecified female breast: Secondary | ICD-10-CM | POA: Diagnosis not present

## 2013-04-11 DIAGNOSIS — C50912 Malignant neoplasm of unspecified site of left female breast: Secondary | ICD-10-CM

## 2013-04-11 DIAGNOSIS — M810 Age-related osteoporosis without current pathological fracture: Secondary | ICD-10-CM

## 2013-04-11 MED ORDER — ANASTROZOLE 1 MG PO TABS: 1.0000 mg | ORAL_TABLET | Freq: Every day | ORAL | Status: DC

## 2013-04-11 NOTE — Progress Notes (Signed)
ID: Lin Givens   DOB: [birth year]  MR#: [id number]  CSN#:[id number]  PCP: Junious Silk, MD GYN: SU:  OTHER MD: Posey Rea   HISTORY OF PRESENT ILLNESS: The patient was noted to have left breast calcifications which have been followed on a every 6 month basis, most recently 10/11/2011. Bilateral diagnostic mammography on that date showed a mass in the medial left breast and a separate area of linear calcifications. The right breast was unremarkable. Ultrasound showed a simple cysts correlating with the mass seen on mammography. The area of abnormal calcifications in the lower outer quadrant was biopsied 10/20/2011 and showed ([id number]) and invasive ductal carcinoma, which appeared low to intermediate grade, and which was estrogen receptor positive at 98%, progesterone receptor positive at 54%, with no HER-2 amplification, and with an MIB-1 of 47%  On 10/27/2011 the patient underwent bilateral breast MRIs. This showed post biopsy change but also a 3 mm focus of enhancement in the lateral aspect of the biopsy cavity. There was no MRI evidence for multifocal, or contralateral malignancy or for lymph node spread. Accordingly, on 11/21/2011 the patient underwent left lumpectomy and sentinel lymph node sampling under Dr. Corliss Skains. This showed ([id number]) high-grade invasive ductal carcinoma, measuring 1.1 cm, with 0 of 2 lymph nodes negative, and ample margins. Repeat HER-2 testing again showed no amplification.  Her subsequent history is as detailed below.  INTERVAL HISTORY:  Kissa returns for routine followup of her left breast carcinoma. She tolerates the anastrozole well, except for hot flashes, which usually hit her right after she gets out of the shower in the morning. They do not interrupt her sleep.  REVIEW OF SYSTEMS: Madiline received 2 epidural shots for her low back pain, she tells the first one was very effective, the second one not so much so. She is now pain  free at because she is doing less. She is sitting more. She does go to the pool in their complex and does some swimming and some water exercises. She is planning to have cataract surgery. Otherwise a detailed review of systems today was entirely stable.   PAST MEDICAL HISTORY: Past Medical History  Diagnosis Date  . Diverticulitis of colon with perforation   . S/P CABG x 2 06/2011    Dr. Tyrone Sage (L-LAD, [facility])  . Carotid stenosis     dopplers 10/12: LICA 40-59%  . DM2 (diabetes mellitus, type 2)   . HTN (hypertension)   . HLD (hyperlipidemia)   . Hypothyroidism   . RLS (restless legs syndrome)   . Breast cancer, left breast 11/01/2011  . CAD (coronary artery disease)     LHC 06/14/11: LAD 80% and 80% after Dx, Dx 80% (Dx was small), oOM 80%, mOM 70% (OM was small), oRCA 70-80%, mRCA 60-70%, EF 55% - referred for CABG;   Echo 06/14/11: mild focal basal septal hypertrophy, grade 1 diast dysfxn, mild LAE, mild RVE, PASP 37.    . Kidney stones   . GERD (gastroesophageal reflux disease)   . Psoriasis   . Diabetes mellitus     x 5-6 yrs  . Breast cancer 10/20/11    L breast, ER/PR+, HER2 -  . S/P radiation therapy 12/20/11 - 01/10/12    Left Breast: 4256 cGy/ 16 Fractions    PAST SURGICAL HISTORY: Past Surgical History  Procedure Laterality Date  . Colon resection  1977  . Arthroscopy right knee    . Off-pump coronary artery bypass grafting x2  06/16/2011  . Vein  bypass surgery  1999  . Colon surgery      part colectomy-  . Appendectomy    . Coronary artery bypass graft  10/12  . Breast biospy  11/04/11    Breast, Left, Needle Core Biopsy 6 Oclock, Benign Breast Tissue wtih Fibrocystic  Changes: Microcalcifications Identified, No Neoplasm or Malignancy Identified  . Breast biospy  10/20/11    Breast, Left, Needle Core Biopsy LOQ: Invsive ductal Carcinoman  . Breast lumpectomy  11/22/11    Breast, Lumpectomy, Left with Sentinel Node Biopsy: Invasive High Grade Ductal Carcinoma : No  lymphovascular Invasion: High  Grade Ductal Carcinoma In-Situ with Calcification and Comedo Necrosis  . Abdominal hysterectomy      age 79's    FAMILY HISTORY Family History  Problem Relation Age of Onset  . Cancer Mother     throat  . Hypertension Brother   . Cancer Paternal Uncle    the patient's father died at the age of 13 from a subarachnoid hemorrhage. The patient's mother died at the age of 88 from congestive heart failure. She had a history of throat cancer. The patient has one brother, currently 85 years old. She had no sisters. There is no history of breast or ovarian cancer in the family  GYNECOLOGIC HISTORY: She had menarche age 75. She is GX P2, first pregnancy to term age 68. She underwent hysterectomy around age 26 to do to menorrhagia. She took Premarin as a patch for many years, discontinued about 5 years ago  SOCIAL HISTORY: She worked as a Catering manager in a Therapist, art. Her husband of 58+ years, Marilu Favre (goes by [name]) used to work for [employer]. Their son [name] lives in [place] and works for [employer]. The patient's other child was stillborn. She has no grandchildren. She belongs to the [employer] and wants me to know that on Saturdays starting in May from 8 AM to 2:30 PM they have a hot dog sale that is considered the best in [place] (actually featured in the [employer] this month)  ADVANCED DIRECTIVES: In place  HEALTH MAINTENANCE: History  Substance Use Topics  . Smoking status: Former Smoker -- 1.00 packs/day for 6 years    Quit date: 09/05/1980  . Smokeless tobacco: Not on file  . Alcohol Use: Yes     Comment: socially     Colonoscopy: "about 2007, no need to repeat"  PAP: s/p hysterectomy  Bone density: At the breast center 09/12/2008, showing significant osteopenia  Lipid panel: Under treatment  No Known Allergies  Current Outpatient Prescriptions  Medication Sig Dispense Refill  . anastrozole (ARIMIDEX) 1 MG  tablet       . aspirin 81 MG tablet Take 81 mg by mouth daily.        Marland Kitchen CALCIUM PO Take 2 capsules by mouth at bedtime.      . cetirizine (ZYRTEC) 10 MG tablet Take 10 mg by mouth as needed.       . Cholecalciferol (VITAMIN D-3) 5000 UNITS TABS Take 1 tablet by mouth daily.      . CRESTOR 20 MG tablet Take 20 mg by mouth daily.       . diazepam (VALIUM) 5 MG tablet Take 5 mg by mouth every 6 (six) hours as needed.       Marland Kitchen esomeprazole (NEXIUM) 40 MG capsule Take 40 mg by mouth daily before breakfast.        . ezetimibe (ZETIA) 10 MG tablet  Take 10 mg by mouth daily.        . furosemide (LASIX) 20 MG tablet Take 1 tablet (20 mg total) by mouth daily.  90 tablet  3  . HYDROcodone-acetaminophen (VICODIN) 5-500 MG per tablet Take 1 tablet by mouth every 6 (six) hours as needed.      Marland Kitchen levothyroxine (SYNTHROID, LEVOTHROID) 125 MCG tablet Take 125 mcg by mouth daily.        . metoprolol (LOPRESSOR) 50 MG tablet Take one and one-half tablet by mouth twice a day  270 tablet  3  . Multiple Vitamin (MULTIVITAMIN) capsule Take 1 capsule by mouth daily.        . pioglitazone (ACTOS) 15 MG tablet Take 15 mg by mouth daily.        . potassium chloride (K-DUR,KLOR-CON) 10 MEQ tablet Take 1 tablet (10 mEq total) by mouth daily.  90 tablet  3  . rOPINIRole (REQUIP) 3 MG tablet Take 3 mg by mouth at bedtime.       . traMADol (ULTRAM) 50 MG tablet Take 50 mg by mouth every 6 (six) hours as needed. Maximum dose= 8 tablets per day        No current facility-administered medications for this visit.    OBJECTIVE: Middle-aged white woman who appears stated age 49 Vitals:   04/11/13 0938  BP: 176/78  Pulse: 59  Temp: 97.7 F (36.5 C)  Resp: 20     Body mass index is 33.95 kg/(m^2).    ECOG FS: 2 Filed Weights   04/11/13 0938  Weight: 197 lb 14.4 oz (89.767 kg)   Sclerae unicteric, pupils equal and reactive to light Oropharynx clear No peripheral adenopathy Lungs clear to auscultation bilaterally no  rales or rhonchi Heart regular rate and rhythm, no murmur appreciated Abdomen soft, nontender, with positive bowel sounds MSK no focal spinal tenderness to gentle palpation, mild bilateral ankle edema  Neuro: nonfocal, well oriented,positive affect Breasts: Right breast unremarkable; left breast status post  lumpectomy and radiation; there is no evidence of local recurrence. The left axilla is benign  LAB RESULTS: Lab Results  Component Value Date   WBC 5.9 04/04/2013   NEUTROABS 3.6 04/04/2013   HGB 14.2 04/04/2013   HCT 41.2 04/04/2013   MCV 86.6 04/04/2013   PLT 230 04/04/2013      Chemistry      Component Value Date/Time   NA 142 04/04/2013 0959   NA 143 12/07/2011 0914   K 4.0 04/04/2013 0959   K 4.1 12/07/2011 0914   CL 105 10/17/2012 0912   CL 108 12/07/2011 0914   CO2 25 04/04/2013 0959   CO2 23 12/07/2011 0914   BUN 11.4 04/04/2013 0959   BUN 18 12/07/2011 0914   CREATININE 0.8 04/04/2013 0959   CREATININE 0.75 12/07/2011 0914      Component Value Date/Time   CALCIUM 10.0 04/04/2013 0959   CALCIUM 9.7 12/07/2011 0914   ALKPHOS 106 04/04/2013 0959   ALKPHOS 109 12/07/2011 0914   AST 32 04/04/2013 0959   AST 20 12/07/2011 0914   ALT 38 04/04/2013 0959   ALT 16 12/07/2011 0914   BILITOT 0.86 04/04/2013 0959   BILITOT 0.7 12/07/2011 0914       Lab Results  Component Value Date   LABCA2 14 10/17/2012     STUDIES:  Most recent bone density was in April 2013 at the breast center showing osteoporosis.   Most recent mammogram on 10/11/2012 was unremarkable.  ASSESSMENT: 79 y.o.  [place] woman,   (1)  status post left lumpectomy and sentinel lymph node sampling 11/21/2011 for a T1c N0, stage IA invasive ductal carcinoma, grade 3, strongly estrogen and progesterone receptor positive, with an MIB-1 of 47%, and no HER-2 amplification.   (2)  Completed radiation 01/10/2012 and started anastrozole June 2013  (3)  osteoporosis, receiving zoledronic acid annually, first dose given on  05/24/2012.  PLAN:  Evonne is doing very well from a breast cancer point of view. The plan is to continue anastrozole for a total of 5 years. We will continue to do zolendronate once a year, every September, until she completes the anastrozole.  At this time I am going to start seeing her on a once a year basis. She knows to call for any problems that may develop before next visit here.Marland Kitchen     Nazariah Cadet C    04/11/2013

## 2013-04-12 ENCOUNTER — Telehealth: Payer: Self-pay | Admitting: *Deleted

## 2013-04-12 NOTE — Telephone Encounter (Signed)
Lm gv appt for 2015. Labs@9 :45am on 04/07/14, and ov on 04/14/14 @9 :45am. Pt is aware tx tx will be added for sept. i emailed MB to add the tx. i made the pt aware that i will mail a letter/avs as well...td

## 2013-04-17 DIAGNOSIS — H2510 Age-related nuclear cataract, unspecified eye: Secondary | ICD-10-CM | POA: Diagnosis not present

## 2013-04-18 ENCOUNTER — Ambulatory Visit: Payer: Medicare Other | Admitting: Oncology

## 2013-04-24 DIAGNOSIS — H2510 Age-related nuclear cataract, unspecified eye: Secondary | ICD-10-CM | POA: Diagnosis not present

## 2013-05-07 ENCOUNTER — Telehealth: Payer: Self-pay | Admitting: Oncology

## 2013-05-07 NOTE — Telephone Encounter (Signed)
Pt called to confirm zometa appt for 9/12 @ 2:45pm.

## 2013-05-17 ENCOUNTER — Ambulatory Visit (HOSPITAL_BASED_OUTPATIENT_CLINIC_OR_DEPARTMENT_OTHER): Payer: Medicare Other

## 2013-05-17 VITALS — BP 181/88 | HR 68 | Temp 97.7°F | Resp 18

## 2013-05-17 DIAGNOSIS — M810 Age-related osteoporosis without current pathological fracture: Secondary | ICD-10-CM | POA: Diagnosis not present

## 2013-05-17 DIAGNOSIS — C50919 Malignant neoplasm of unspecified site of unspecified female breast: Secondary | ICD-10-CM

## 2013-05-17 DIAGNOSIS — C50912 Malignant neoplasm of unspecified site of left female breast: Secondary | ICD-10-CM

## 2013-05-17 MED ORDER — HEPARIN SOD (PORK) LOCK FLUSH 100 UNIT/ML IV SOLN
250.0000 [IU] | Freq: Once | INTRAVENOUS | Status: DC | PRN
  Filled 2013-05-17: qty 5

## 2013-05-17 MED ORDER — SODIUM CHLORIDE 0.9 % IV SOLN: Freq: Once | INTRAVENOUS | Status: DC

## 2013-05-17 MED ORDER — ZOLEDRONIC ACID 4 MG/100ML IV SOLN
4.0000 mg | Freq: Once | INTRAVENOUS | Status: AC
  Administered 2013-05-17: 4 mg via INTRAVENOUS
  Filled 2013-05-17: qty 100

## 2013-05-17 NOTE — Patient Instructions (Signed)
 ZOLEDRONIC ACID (ZOE le dron ik AS id) lowers the amount of calcium loss from bone. It is used to treat too much calcium in your blood from cancer. It is also used to prevent complications of cancer that has spread to the bone. This medicine may be used for other purposes; ask your health care provider or pharmacist if you have questions. What should I tell my health care provider before I take this medicine? They need to know if you have any of these conditions: -aspirin-sensitive asthma -dental disease -kidney disease -an unusual or allergic reaction to zoledronic acid, other medicines, foods, dyes, or preservatives -pregnant or trying to get pregnant -breast-feeding How should I use this medicine? This medicine is for infusion into a vein. It is given by a health care professional in a hospital or clinic setting. Talk to your pediatrician regarding the use of this medicine in children. Special care may be needed. Overdosage: If you think you have taken too much of this medicine contact a poison control center or emergency room at once. NOTE: This medicine is only for you. Do not share this medicine with others. What if I miss a dose? It is important not to miss your dose. Call your doctor or health care professional if you are unable to keep an appointment. What may interact with this medicine? -certain antibiotics given by injection -NSAIDs, medicines for pain and inflammation, like ibuprofen or naproxen -some diuretics like bumetanide, furosemide -teriparatide -thalidomide This list may not describe all possible interactions. Give your health care provider a list of all the medicines, herbs, non-prescription drugs, or dietary supplements you use. Also tell them if you smoke, drink alcohol, or use illegal drugs. Some items may interact with your medicine. What should I watch for while using this medicine? Visit your doctor or health care professional for regular checkups. It may be  some time before you see the benefit from this medicine. Do not stop taking your medicine unless your doctor tells you to. Your doctor may order blood tests or other tests to see how you are doing. Women should inform their doctor if they wish to become pregnant or think they might be pregnant. There is a potential for serious side effects to an unborn child. Talk to your health care professional or pharmacist for more information. You should make sure that you get enough calcium and vitamin D while you are taking this medicine. Discuss the foods you eat and the vitamins you take with your health care professional. Some people who take this medicine have severe bone, joint, and/or muscle pain. This medicine may also increase your risk for a broken thigh bone. Tell your doctor right away if you have pain in your upper leg or groin. Tell your doctor if you have any pain that does not go away or that gets worse. What side effects may I notice from receiving this medicine? Side effects that you should report to your doctor or health care professional as soon as possible: -allergic reactions like skin rash, itching or hives, swelling of the face, lips, or tongue -anxiety, confusion, or depression -breathing problems -changes in vision -feeling faint or lightheaded, falls -jaw burning, cramping, pain -muscle cramps, stiffness, or weakness -trouble passing urine or change in the amount of urine Side effects that usually do not require medical attention (report to your doctor or health care professional if they continue or are bothersome): -bone, joint, or muscle pain -fever -hair loss -irritation at site where injected -  loss of appetite -nausea, vomiting -stomach upset -tired This list may not describe all possible side effects. Call your doctor for medical advice about side effects. You may report side effects to FDA at 1-800-FDA-1088. Where should I keep my medicine? This drug is given in a  hospital or clinic and will not be stored at home. NOTE: This sheet is a summary. It may not cover all possible information. If you have questions about this medicine, talk to your doctor, pharmacist, or health care provider.  2012, Elsevier/Gold Standard. (02/18/2011 9:06:58 AM) 

## 2013-05-21 DIAGNOSIS — E039 Hypothyroidism, unspecified: Secondary | ICD-10-CM | POA: Diagnosis not present

## 2013-05-21 DIAGNOSIS — E119 Type 2 diabetes mellitus without complications: Secondary | ICD-10-CM | POA: Diagnosis not present

## 2013-05-21 DIAGNOSIS — I10 Essential (primary) hypertension: Secondary | ICD-10-CM | POA: Diagnosis not present

## 2013-05-21 DIAGNOSIS — E785 Hyperlipidemia, unspecified: Secondary | ICD-10-CM | POA: Diagnosis not present

## 2013-05-23 DIAGNOSIS — E785 Hyperlipidemia, unspecified: Secondary | ICD-10-CM | POA: Diagnosis not present

## 2013-05-23 DIAGNOSIS — I10 Essential (primary) hypertension: Secondary | ICD-10-CM | POA: Diagnosis not present

## 2013-05-23 DIAGNOSIS — Z23 Encounter for immunization: Secondary | ICD-10-CM | POA: Diagnosis not present

## 2013-05-23 DIAGNOSIS — E119 Type 2 diabetes mellitus without complications: Secondary | ICD-10-CM | POA: Diagnosis not present

## 2013-05-23 DIAGNOSIS — E039 Hypothyroidism, unspecified: Secondary | ICD-10-CM | POA: Diagnosis not present

## 2013-07-18 ENCOUNTER — Other Ambulatory Visit: Payer: Self-pay | Admitting: Cardiology

## 2013-07-26 ENCOUNTER — Ambulatory Visit (INDEPENDENT_AMBULATORY_CARE_PROVIDER_SITE_OTHER): Payer: Medicare Other | Admitting: Cardiovascular Disease

## 2013-07-26 ENCOUNTER — Encounter: Payer: Self-pay | Admitting: Cardiovascular Disease

## 2013-07-26 VITALS — BP 174/70 | HR 52 | Ht 63.0 in | Wt 192.0 lb

## 2013-07-26 DIAGNOSIS — I10 Essential (primary) hypertension: Secondary | ICD-10-CM | POA: Diagnosis not present

## 2013-07-26 DIAGNOSIS — E785 Hyperlipidemia, unspecified: Secondary | ICD-10-CM | POA: Diagnosis not present

## 2013-07-26 DIAGNOSIS — I779 Disorder of arteries and arterioles, unspecified: Secondary | ICD-10-CM

## 2013-07-26 DIAGNOSIS — I447 Left bundle-branch block, unspecified: Secondary | ICD-10-CM

## 2013-07-26 DIAGNOSIS — I2510 Atherosclerotic heart disease of native coronary artery without angina pectoris: Secondary | ICD-10-CM

## 2013-07-26 MED ORDER — DIAZEPAM 5 MG PO TABS: 5.0000 mg | ORAL_TABLET | Freq: Four times a day (QID) | ORAL | Status: DC | PRN

## 2013-07-26 MED ORDER — LOSARTAN POTASSIUM 50 MG PO TABS: 50.0000 mg | ORAL_TABLET | Freq: Every day | ORAL | Status: DC

## 2013-07-26 NOTE — Patient Instructions (Signed)
Your physician wants you to follow-up in:  6 months.  You will receive a reminder letter in the mail two months in advance. If you don't receive a letter, please call our office to schedule the follow-up appointment.   Your physician has requested that you have an echocardiogram. Echocardiography is a painless test that uses sound waves to create images of your heart. It provides your doctor with information about the size and shape of your heart and how well your heart's chambers and valves are working. This procedure takes approximately one hour. There are no restrictions for this procedure.  Your physician has requested that you have a carotid duplex. This test is an ultrasound of the carotid arteries in your neck. It looks at blood flow through these arteries that supply the brain with blood. Allow one hour for this exam. There are no restrictions or special instructions.  Your physician has recommended you make the following change in your medication:  Start Cozaar 50 mg by mouth daily

## 2013-07-26 NOTE — Progress Notes (Signed)
History of Present Illness: 79 yo female with history of CAD s/p CABG 2012, HTN, HLD, DM, carotid artery disease, hypothyroidism, breast cancer here today for cardiac follow up. She has been followed by Dr. Riley Kill. She presented with unstable angina in October 2012 and was found to have severe triple vessel CAD by cath, followed by 2V CABG per Dr. Tyrone Sage (LIMA to LAD, SVG to RCA). She has done well since then. Breast cancer s/p lumpectomy and has completed radiation. She takes Arimidex.   She is here today for follow up. She gives out of breath if she walks over one block. No chest pains. No dizziness, near syncope or syncope.   Primary Care Physician: Casimiro Needle Altheimer  Last Lipid Profile: Followed in primary care.    Past Medical History  Diagnosis Date  . Diverticulitis of colon with perforation   . S/P CABG x 2 06/2011    Dr. Tyrone Sage (L-LAD, [facility])  . Carotid stenosis     dopplers 10/12: LICA 40-59%  . DM2 (diabetes mellitus, type 2)   . HTN (hypertension)   . HLD (hyperlipidemia)   . Hypothyroidism   . RLS (restless legs syndrome)   . Breast cancer, left breast 11/01/2011  . CAD (coronary artery disease)     LHC 06/14/11: LAD 80% and 80% after Dx, Dx 80% (Dx was small), oOM 80%, mOM 70% (OM was small), oRCA 70-80%, mRCA 60-70%, EF 55% - referred for CABG;   Echo 06/14/11: mild focal basal septal hypertrophy, grade 1 diast dysfxn, mild LAE, mild RVE, PASP 37.    . Kidney stones   . GERD (gastroesophageal reflux disease)   . Psoriasis   . Diabetes mellitus     x 5-6 yrs  . Breast cancer 10/20/11    L breast, ER/PR+, HER2 -  . S/P radiation therapy 12/20/11 - 01/10/12    Left Breast: 4256 cGy/ 16 Fractions    Past Surgical History  Procedure Laterality Date  . Colon resection  1977  . Arthroscopy right knee    . Off-pump coronary artery bypass grafting x2  06/16/2011  . Vein bypass surgery  1999  . Colon surgery      part colectomy-  . Appendectomy    . Coronary  artery bypass graft  10/12  . Breast biospy  11/04/11    Breast, Left, Needle Core Biopsy 6 Oclock, Benign Breast Tissue wtih Fibrocystic  Changes: Microcalcifications Identified, No Neoplasm or Malignancy Identified  . Breast biospy  10/20/11    Breast, Left, Needle Core Biopsy LOQ: Invsive ductal Carcinoman  . Breast lumpectomy  11/22/11    Breast, Lumpectomy, Left with Sentinel Node Biopsy: Invasive High Grade Ductal Carcinoma : No lymphovascular Invasion: High  Grade Ductal Carcinoma In-Situ with Calcification and Comedo Necrosis  . Abdominal hysterectomy      age 38's    Current Outpatient Prescriptions  Medication Sig Dispense Refill  . anastrozole (ARIMIDEX) 1 MG tablet Take 1 tablet (1 mg total) by mouth daily.  90 tablet  12  . aspirin 81 MG tablet Take 81 mg by mouth daily.        Marland Kitchen CALCIUM PO Take 2 capsules by mouth at bedtime.      . cetirizine (ZYRTEC) 10 MG tablet Take 10 mg by mouth as needed.       . Cholecalciferol (VITAMIN D-3) 5000 UNITS TABS Take 1 tablet by mouth daily.      . CRESTOR 20 MG tablet Take 20 mg  by mouth daily.       . diazepam (VALIUM) 5 MG tablet Take 5 mg by mouth every 6 (six) hours as needed.       Marland Kitchen esomeprazole (NEXIUM) 40 MG capsule Take 40 mg by mouth daily before breakfast.        . ezetimibe (ZETIA) 10 MG tablet Take 10 mg by mouth daily.        . furosemide (LASIX) 20 MG tablet TAKE 1 TABLET DAILY  90 tablet  0  . HYDROcodone-acetaminophen (VICODIN) 5-500 MG per tablet Take 1 tablet by mouth every 6 (six) hours as needed.      Marland Kitchen KLOR-CON M10 10 MEQ tablet TAKE 1 TABLET DAILY  90 tablet  0  . levothyroxine (SYNTHROID, LEVOTHROID) 125 MCG tablet Take 125 mcg by mouth daily.        . metoprolol (LOPRESSOR) 50 MG tablet Take one and one-half tablet by mouth twice a day  270 tablet  3  . Multiple Vitamin (MULTIVITAMIN) capsule Take 1 capsule by mouth daily.        . pioglitazone (ACTOS) 15 MG tablet Take 15 mg by mouth daily.        Marland Kitchen rOPINIRole  (REQUIP) 3 MG tablet Take 3 mg by mouth at bedtime.       . traMADol (ULTRAM) 50 MG tablet Take 50 mg by mouth every 6 (six) hours as needed. Maximum dose= 8 tablets per day        No current facility-administered medications for this visit.    No Known Allergies  History   Social History  . Marital Status: Married    Spouse Name: N/A    Number of Children: 1  . Years of Education: N/A   Occupational History  . RETIRED-worked for an accounting firm    Social History Main Topics  . Smoking status: Former Smoker -- 1.00 packs/day for 6 years    Types: Cigarettes    Quit date: 09/05/1980  . Smokeless tobacco: Not on file  . Alcohol Use: Yes     Comment: socially  . Drug Use: No  . Sexual Activity: Not on file     Comment: menarche 74, p2, 1 stillbirth, 1st preg age 71, HRT x many yrs   Other Topics Concern  . Not on file   Social History Narrative  . No narrative on file    Family History  Problem Relation Age of Onset  . Cancer Mother     throat  . Hypertension Brother   . Cancer Paternal Uncle     Review of Systems:  As stated in the HPI and otherwise negative.   BP 174/70  Pulse 52  Ht 5\' 3"  (1.6 m)  Wt 192 lb (87.091 kg)  BMI 34.02 kg/m2  Physical Examination: General: Well developed, well nourished, NAD HEENT: OP clear, mucus membranes moist SKIN: warm, dry. No rashes. Neuro: No focal deficits Musculoskeletal: Muscle strength 5/5 all ext Psychiatric: Mood and affect normal Neck: No JVD, no carotid bruits, no thyromegaly, no lymphadenopathy. Lungs:Clear bilaterally, no wheezes, rhonci, crackles Cardiovascular: Regular rate and rhythm. No murmurs, gallops or rubs. Abdomen:Soft. Bowel sounds present. Non-tender.  Extremities: No lower extremity edema. Pulses are 2 + in the bilateral DP/PT.  Cardiac cath 06/11/13: 2. The left main is free of critical disease.  3. The LAD has a bifurcation stenosis. There is about 80% involvement  of the LAD and then  80% involvement of diagonal. There is 80%  lesion  in the LAD just beyond the diagonal. The distal LAD is  suitable for grafting.  4. The circumflex provides a first marginal that has probably 80%  ostial and then a 70% mid lesion. The remaining portion of the  circumflex is fairly large and is without critical narrowing other  than minor luminal irregularity.  5. The right coronary artery has clear-cut 70-80% ostial stenosis.  There is also a 60-70% mid vessel stenosis. The PDA and PLA are  suitable for grafting.  6. Ventriculography in the RAO projection reveals well-preserved left  ventricular function with a normal ejection fraction 55% by  estimate. I did not appreciate significant regurgitation.   EKG: Sinus brady, rate 52 bpm. LBBB.   Assessment and Plan:   1. CAD: Stable. NO recent angina. Will continue medical management. Echo to assess LVEF  2. HTN: BP elevated today. Will add Cozaar 50 mg po Qdaily.   3. Hyperlipidemia: Continue statin.   4. LBBB: Chronic  5. Carotid artery disease: needs repeat dopplers.

## 2013-07-29 ENCOUNTER — Ambulatory Visit (HOSPITAL_COMMUNITY): Payer: Medicare Other | Attending: Cardiovascular Disease | Admitting: Radiology

## 2013-07-29 ENCOUNTER — Other Ambulatory Visit: Payer: Self-pay

## 2013-07-29 DIAGNOSIS — E039 Hypothyroidism, unspecified: Secondary | ICD-10-CM | POA: Insufficient documentation

## 2013-07-29 DIAGNOSIS — I10 Essential (primary) hypertension: Secondary | ICD-10-CM | POA: Insufficient documentation

## 2013-07-29 DIAGNOSIS — I447 Left bundle-branch block, unspecified: Secondary | ICD-10-CM | POA: Diagnosis not present

## 2013-07-29 DIAGNOSIS — E785 Hyperlipidemia, unspecified: Secondary | ICD-10-CM | POA: Diagnosis not present

## 2013-07-29 DIAGNOSIS — R609 Edema, unspecified: Secondary | ICD-10-CM | POA: Insufficient documentation

## 2013-07-29 DIAGNOSIS — I2510 Atherosclerotic heart disease of native coronary artery without angina pectoris: Secondary | ICD-10-CM

## 2013-07-29 DIAGNOSIS — Z87891 Personal history of nicotine dependence: Secondary | ICD-10-CM | POA: Insufficient documentation

## 2013-07-29 DIAGNOSIS — I6529 Occlusion and stenosis of unspecified carotid artery: Secondary | ICD-10-CM | POA: Diagnosis not present

## 2013-07-29 DIAGNOSIS — E119 Type 2 diabetes mellitus without complications: Secondary | ICD-10-CM | POA: Diagnosis not present

## 2013-07-29 DIAGNOSIS — I079 Rheumatic tricuspid valve disease, unspecified: Secondary | ICD-10-CM | POA: Diagnosis not present

## 2013-07-29 DIAGNOSIS — Z853 Personal history of malignant neoplasm of breast: Secondary | ICD-10-CM | POA: Insufficient documentation

## 2013-07-29 NOTE — Progress Notes (Signed)
Echocardiogram performed.  

## 2013-08-07 ENCOUNTER — Encounter: Payer: Self-pay | Admitting: Cardiovascular Disease

## 2013-08-07 ENCOUNTER — Ambulatory Visit (HOSPITAL_COMMUNITY): Payer: Medicare Other | Attending: Cardiovascular Disease

## 2013-08-07 DIAGNOSIS — I6529 Occlusion and stenosis of unspecified carotid artery: Secondary | ICD-10-CM | POA: Diagnosis not present

## 2013-08-07 DIAGNOSIS — Z87891 Personal history of nicotine dependence: Secondary | ICD-10-CM | POA: Insufficient documentation

## 2013-08-07 DIAGNOSIS — I10 Essential (primary) hypertension: Secondary | ICD-10-CM | POA: Diagnosis not present

## 2013-08-07 DIAGNOSIS — E785 Hyperlipidemia, unspecified: Secondary | ICD-10-CM | POA: Insufficient documentation

## 2013-08-07 DIAGNOSIS — E119 Type 2 diabetes mellitus without complications: Secondary | ICD-10-CM | POA: Insufficient documentation

## 2013-08-07 DIAGNOSIS — I658 Occlusion and stenosis of other precerebral arteries: Secondary | ICD-10-CM | POA: Insufficient documentation

## 2013-08-07 DIAGNOSIS — I2510 Atherosclerotic heart disease of native coronary artery without angina pectoris: Secondary | ICD-10-CM | POA: Insufficient documentation

## 2013-08-08 ENCOUNTER — Other Ambulatory Visit: Payer: Self-pay

## 2013-08-08 DIAGNOSIS — I2510 Atherosclerotic heart disease of native coronary artery without angina pectoris: Secondary | ICD-10-CM

## 2013-08-08 DIAGNOSIS — R609 Edema, unspecified: Secondary | ICD-10-CM

## 2013-08-08 MED ORDER — METOPROLOL TARTRATE 50 MG PO TABS: ORAL_TABLET | ORAL | Status: DC

## 2013-08-13 ENCOUNTER — Other Ambulatory Visit: Payer: Self-pay | Admitting: *Deleted

## 2013-08-13 DIAGNOSIS — C50919 Malignant neoplasm of unspecified site of unspecified female breast: Secondary | ICD-10-CM

## 2013-08-13 MED ORDER — ANASTROZOLE 1 MG PO TABS: 1.0000 mg | ORAL_TABLET | Freq: Every day | ORAL | Status: DC

## 2013-08-14 ENCOUNTER — Other Ambulatory Visit: Payer: Self-pay | Admitting: Cardiology

## 2013-08-19 DIAGNOSIS — Z961 Presence of intraocular lens: Secondary | ICD-10-CM | POA: Diagnosis not present

## 2013-08-20 NOTE — Telephone Encounter (Signed)
Patient called stating the [facility] needed a new Rx for Arimidex, refaxed Rx that was sent on 08/13/13.

## 2013-08-23 DIAGNOSIS — L408 Other psoriasis: Secondary | ICD-10-CM | POA: Diagnosis not present

## 2013-08-30 ENCOUNTER — Emergency Department (HOSPITAL_COMMUNITY)
Admission: EM | Admit: 2013-08-30 | Discharge: 2013-08-30 | Discharge status: Against Medical Advice | Payer: Medicare Other | Attending: Emergency Medicine | Admitting: Emergency Medicine

## 2013-08-30 DIAGNOSIS — Z951 Presence of aortocoronary bypass graft: Secondary | ICD-10-CM | POA: Diagnosis not present

## 2013-08-30 DIAGNOSIS — I10 Essential (primary) hypertension: Secondary | ICD-10-CM | POA: Insufficient documentation

## 2013-08-30 DIAGNOSIS — E119 Type 2 diabetes mellitus without complications: Secondary | ICD-10-CM | POA: Insufficient documentation

## 2013-08-30 DIAGNOSIS — I2510 Atherosclerotic heart disease of native coronary artery without angina pectoris: Secondary | ICD-10-CM | POA: Insufficient documentation

## 2013-08-30 DIAGNOSIS — Z87891 Personal history of nicotine dependence: Secondary | ICD-10-CM | POA: Insufficient documentation

## 2013-08-30 DIAGNOSIS — R040 Epistaxis: Secondary | ICD-10-CM | POA: Insufficient documentation

## 2013-08-30 DIAGNOSIS — Z7982 Long term (current) use of aspirin: Secondary | ICD-10-CM | POA: Diagnosis not present

## 2013-09-02 DIAGNOSIS — I10 Essential (primary) hypertension: Secondary | ICD-10-CM | POA: Diagnosis not present

## 2013-09-02 DIAGNOSIS — E039 Hypothyroidism, unspecified: Secondary | ICD-10-CM | POA: Diagnosis not present

## 2013-09-02 DIAGNOSIS — E119 Type 2 diabetes mellitus without complications: Secondary | ICD-10-CM | POA: Diagnosis not present

## 2013-09-02 DIAGNOSIS — E785 Hyperlipidemia, unspecified: Secondary | ICD-10-CM | POA: Diagnosis not present

## 2013-09-04 ENCOUNTER — Telehealth: Payer: Self-pay | Admitting: *Deleted

## 2013-09-04 DIAGNOSIS — I10 Essential (primary) hypertension: Secondary | ICD-10-CM | POA: Diagnosis not present

## 2013-09-04 DIAGNOSIS — E785 Hyperlipidemia, unspecified: Secondary | ICD-10-CM | POA: Diagnosis not present

## 2013-09-04 DIAGNOSIS — E039 Hypothyroidism, unspecified: Secondary | ICD-10-CM | POA: Diagnosis not present

## 2013-09-04 DIAGNOSIS — E119 Type 2 diabetes mellitus without complications: Secondary | ICD-10-CM | POA: Diagnosis not present

## 2013-09-04 NOTE — Telephone Encounter (Signed)
sw pt informed her that AGB will be on pal 04/14/14. gv appt for 04/21/14@ 9:45am. Pt is aware...td

## 2013-10-01 ENCOUNTER — Other Ambulatory Visit: Payer: Self-pay | Admitting: Endocrinology

## 2013-10-01 DIAGNOSIS — Z853 Personal history of malignant neoplasm of breast: Secondary | ICD-10-CM

## 2013-10-23 ENCOUNTER — Other Ambulatory Visit: Payer: Self-pay | Admitting: Cardiovascular Disease

## 2013-11-08 ENCOUNTER — Other Ambulatory Visit: Payer: Self-pay | Admitting: Endocrinology

## 2013-11-08 ENCOUNTER — Ambulatory Visit
Admission: RE | Admit: 2013-11-08 | Discharge: 2013-11-08 | Disposition: A | Discharge status: Home / Self Care | Payer: Medicare Other | Source: Ambulatory Visit | Attending: Endocrinology | Admitting: Endocrinology

## 2013-11-08 DIAGNOSIS — Z853 Personal history of malignant neoplasm of breast: Secondary | ICD-10-CM

## 2013-11-08 DIAGNOSIS — R922 Inconclusive mammogram: Secondary | ICD-10-CM | POA: Diagnosis not present

## 2013-11-29 ENCOUNTER — Other Ambulatory Visit: Payer: Self-pay | Admitting: Dermatology

## 2013-11-29 DIAGNOSIS — D485 Neoplasm of uncertain behavior of skin: Secondary | ICD-10-CM | POA: Diagnosis not present

## 2013-11-29 DIAGNOSIS — L408 Other psoriasis: Secondary | ICD-10-CM | POA: Diagnosis not present

## 2013-11-29 DIAGNOSIS — C44319 Basal cell carcinoma of skin of other parts of face: Secondary | ICD-10-CM | POA: Diagnosis not present

## 2013-12-04 DIAGNOSIS — E119 Type 2 diabetes mellitus without complications: Secondary | ICD-10-CM | POA: Diagnosis not present

## 2013-12-04 DIAGNOSIS — E039 Hypothyroidism, unspecified: Secondary | ICD-10-CM | POA: Diagnosis not present

## 2013-12-04 DIAGNOSIS — E785 Hyperlipidemia, unspecified: Secondary | ICD-10-CM | POA: Diagnosis not present

## 2013-12-10 DIAGNOSIS — E785 Hyperlipidemia, unspecified: Secondary | ICD-10-CM | POA: Diagnosis not present

## 2013-12-10 DIAGNOSIS — I10 Essential (primary) hypertension: Secondary | ICD-10-CM | POA: Diagnosis not present

## 2013-12-10 DIAGNOSIS — E039 Hypothyroidism, unspecified: Secondary | ICD-10-CM | POA: Diagnosis not present

## 2013-12-10 DIAGNOSIS — E119 Type 2 diabetes mellitus without complications: Secondary | ICD-10-CM | POA: Diagnosis not present

## 2013-12-19 ENCOUNTER — Other Ambulatory Visit: Payer: Self-pay | Admitting: Cardiovascular Disease

## 2013-12-24 ENCOUNTER — Encounter (INDEPENDENT_AMBULATORY_CARE_PROVIDER_SITE_OTHER): Payer: Self-pay

## 2013-12-24 ENCOUNTER — Encounter (INDEPENDENT_AMBULATORY_CARE_PROVIDER_SITE_OTHER): Payer: Self-pay | Admitting: Surgery

## 2013-12-24 ENCOUNTER — Ambulatory Visit (INDEPENDENT_AMBULATORY_CARE_PROVIDER_SITE_OTHER): Payer: Medicare Other | Admitting: Surgery

## 2013-12-24 VITALS — BP 126/78 | HR 68 | Temp 97.5°F | Resp 14 | Ht 63.0 in | Wt 196.6 lb

## 2013-12-24 DIAGNOSIS — C50519 Malignant neoplasm of lower-outer quadrant of unspecified female breast: Secondary | ICD-10-CM | POA: Diagnosis not present

## 2013-12-24 NOTE — Progress Notes (Signed)
[name] status post lumpectomy and sentinel lymph node biopsy 11/21/11. She has completed radiation and has been on Arimidex for two years. She seems to be tolerating this well. Recent mammogram was unremarkable.   On examination her left axillary and left lumpectomy incisions are well-healed with no sign of infection. No palpable masses in either breast. No palpable lymphadenopathy. No nipple retraction or nipple discharge.   The patient is seeing Dr. Virgie Dad office every 6 months. We will reevaluate her in one year. Continue with annual mammograms.  Imogene Burn. Georgette Dover, MD, [facility]  General/ Trauma Surgery  12/24/2013 3:45 PM

## 2013-12-25 DIAGNOSIS — C44319 Basal cell carcinoma of skin of other parts of face: Secondary | ICD-10-CM | POA: Diagnosis not present

## 2013-12-26 DIAGNOSIS — E119 Type 2 diabetes mellitus without complications: Secondary | ICD-10-CM | POA: Diagnosis not present

## 2013-12-26 DIAGNOSIS — E039 Hypothyroidism, unspecified: Secondary | ICD-10-CM | POA: Diagnosis not present

## 2013-12-26 DIAGNOSIS — I10 Essential (primary) hypertension: Secondary | ICD-10-CM | POA: Diagnosis not present

## 2013-12-26 DIAGNOSIS — E785 Hyperlipidemia, unspecified: Secondary | ICD-10-CM | POA: Diagnosis not present

## 2014-01-02 DIAGNOSIS — J069 Acute upper respiratory infection, unspecified: Secondary | ICD-10-CM | POA: Diagnosis not present

## 2014-01-02 DIAGNOSIS — R0982 Postnasal drip: Secondary | ICD-10-CM | POA: Diagnosis not present

## 2014-01-14 ENCOUNTER — Ambulatory Visit (INDEPENDENT_AMBULATORY_CARE_PROVIDER_SITE_OTHER): Payer: Medicare Other | Admitting: Cardiovascular Disease

## 2014-01-14 ENCOUNTER — Encounter: Payer: Self-pay | Admitting: Cardiovascular Disease

## 2014-01-14 VITALS — BP 118/60 | HR 58 | Ht 62.0 in | Wt 195.0 lb

## 2014-01-14 DIAGNOSIS — I2510 Atherosclerotic heart disease of native coronary artery without angina pectoris: Secondary | ICD-10-CM

## 2014-01-14 DIAGNOSIS — I779 Disorder of arteries and arterioles, unspecified: Secondary | ICD-10-CM

## 2014-01-14 DIAGNOSIS — I739 Peripheral vascular disease, unspecified: Secondary | ICD-10-CM

## 2014-01-14 DIAGNOSIS — E785 Hyperlipidemia, unspecified: Secondary | ICD-10-CM

## 2014-01-14 DIAGNOSIS — I447 Left bundle-branch block, unspecified: Secondary | ICD-10-CM

## 2014-01-14 DIAGNOSIS — I10 Essential (primary) hypertension: Secondary | ICD-10-CM | POA: Diagnosis not present

## 2014-01-14 DIAGNOSIS — I5032 Chronic diastolic (congestive) heart failure: Secondary | ICD-10-CM

## 2014-01-14 MED ORDER — LOSARTAN POTASSIUM 50 MG PO TABS: 50.0000 mg | ORAL_TABLET | Freq: Two times a day (BID) | ORAL | Status: DC

## 2014-01-14 MED ORDER — FUROSEMIDE 40 MG PO TABS: 40.0000 mg | ORAL_TABLET | Freq: Every day | ORAL | Status: DC

## 2014-01-14 NOTE — Progress Notes (Signed)
History of Present Illness: 80 yo female with history of CAD s/p CABG 2012, HTN, HLD, DM, carotid artery disease, hypothyroidism, breast cancer here today for cardiac follow up. She has been followed by Dr. Lia [name]. She presented with unstable angina in October 2012 and was found to have severe triple vessel CAD by cath, followed by 2V CABG per Dr. Servando [name] (LIMA to LAD, SVG to RCA). She has done well since then. Breast cancer s/p lumpectomy and has completed radiation. She takes Arimidex. I met her in November 2014 and at our first visit, she c/o dyspnea. BP was elevated and Cozaar was added. Echo 07/29/13 with normal LVEF, normal PA pressures, no valve disease. Carotid dopplers 08/09/13 with mild bilateral carotid artery disease.   She is here today for follow up. No chest pains. No dizziness, near syncope or syncope. Some dyspnea with exertion. No changes. Mild LE edema.   Primary Care Physician: [name]  Last Lipid Profile: Followed in primary care.    Past Medical History  Diagnosis Date  . Diverticulitis of colon with perforation   . S/P CABG x 2 06/2011    Dr. Servando [name] (L-LAD, [name])  . Carotid stenosis     dopplers 99/24: LICA 26-83%  . DM2 (diabetes mellitus, type 2)   . HTN (hypertension)   . HLD (hyperlipidemia)   . Hypothyroidism   . RLS (restless legs syndrome)   . Breast cancer, left breast 11/01/2011  . CAD (coronary artery disease)     LHC 06/14/11: LAD 80% and 80% after Dx, Dx 80% (Dx was small), oOM 80%, mOM 70% (OM was small), oRCA 70-80%, mRCA 60-70%, EF 55% - referred for CABG;   Echo 06/14/11: mild focal basal septal hypertrophy, grade 1 diast dysfxn, mild LAE, mild RVE, PASP 37.    . Kidney stones   . GERD (gastroesophageal reflux disease)   . Psoriasis   . Diabetes mellitus     x 5-6 yrs  . Breast cancer 10/20/11    L breast, ER/PR+, HER2 -  . S/P radiation therapy 12/20/11 - 01/10/12    Left Breast: 4196 cGy/ 16 Fractions    Past Surgical History   Procedure Laterality Date  . Colon resection  1977  . Arthroscopy right knee    . Off-pump coronary artery bypass grafting x2  06/16/2011  . Vein bypass surgery  1999  . Colon surgery      part colectomy-  . Appendectomy    . Coronary artery bypass graft  10/12  . Breast biospy  11/04/11    Breast, Left, Needle Core Biopsy 6 Oclock, Benign Breast Tissue wtih Fibrocystic  Changes: Microcalcifications Identified, No Neoplasm or Malignancy Identified  . Breast biospy  10/20/11    Breast, Left, Needle Core Biopsy LOQ: Invsive ductal Carcinoman  . Breast lumpectomy  11/22/11    Breast, Lumpectomy, Left with Sentinel Node Biopsy: Invasive High Grade Ductal Carcinoma : No lymphovascular Invasion: High  Grade Ductal Carcinoma In-Situ with Calcification and Comedo Necrosis  . Abdominal hysterectomy      age 31's    Current Outpatient Prescriptions  Medication Sig Dispense Refill  . anastrozole (ARIMIDEX) 1 MG tablet Take 1 tablet (1 mg total) by mouth daily.  90 tablet  2  . aspirin 81 MG tablet Take 81 mg by mouth daily.        Marland Kitchen CALCIUM PO Take 2 capsules by mouth at bedtime.      . cetirizine (ZYRTEC) 10 MG tablet  Take 10 mg by mouth as needed.       . Cholecalciferol (VITAMIN D-3) 5000 UNITS TABS Take 1 tablet by mouth daily.      . CRESTOR 20 MG tablet Take 20 mg by mouth daily.       . diazepam (VALIUM) 5 MG tablet Take 1 tablet (5 mg total) by mouth every 6 (six) hours as needed.  30 tablet  0  . esomeprazole (NEXIUM) 40 MG capsule Take 40 mg by mouth daily before breakfast.        . ezetimibe (ZETIA) 10 MG tablet Take 10 mg by mouth daily.        . furosemide (LASIX) 20 MG tablet TAKE 1 TABLET DAILY  90 tablet  0  . HYDROcodone-acetaminophen (VICODIN) 5-500 MG per tablet Take 1 tablet by mouth every 6 (six) hours as needed.      Marland Kitchen KLOR-CON M10 10 MEQ tablet TAKE 1 TABLET DAILY  90 tablet  0  . KLOR-CON M10 10 MEQ tablet TAKE 1 TABLET DAILY  90 tablet  0  . levothyroxine (SYNTHROID,  LEVOTHROID) 125 MCG tablet Take 125 mcg by mouth daily.       Marland Kitchen losartan (COZAAR) 50 MG tablet Take 1 tablet (50 mg total) by mouth daily.  90 tablet  3  . metoprolol (LOPRESSOR) 50 MG tablet Take one and one-half tablet by mouth twice a day  270 tablet  3  . Multiple Vitamin (MULTIVITAMIN) capsule Take 1 capsule by mouth daily.        . Multiple Vitamins-Minerals (EYE VITAMINS) CAPS Take by mouth.      . pioglitazone (ACTOS) 15 MG tablet Take 15 mg by mouth daily.      Marland Kitchen rOPINIRole (REQUIP) 3 MG tablet Take 3 mg by mouth at bedtime.       . traMADol (ULTRAM) 50 MG tablet Take 50 mg by mouth every 6 (six) hours as needed. Maximum dose= 8 tablets per day        No current facility-administered medications for this visit.    No Known Allergies  History   Social History  . Marital Status: Married    Spouse Name: N/A    Number of Children: 1  . Years of Education: N/A   Occupational History  . RETIRED-worked for an accounting firm    Social History Main Topics  . Smoking status: Former Smoker -- 1.00 packs/day for 6 years    Types: Cigarettes    Quit date: 09/05/1980  . Smokeless tobacco: Not on file  . Alcohol Use: Yes     Comment: socially  . Drug Use: No  . Sexual Activity: Not on file     Comment: menarche 75, p2, 1 stillbirth, 1st preg age 69, HRT x many yrs   Other Topics Concern  . Not on file   Social History Narrative  . No narrative on file    Family History  Problem Relation Age of Onset  . Cancer Mother     throat  . Hypertension Brother   . Cancer Paternal Uncle     Review of Systems:  As stated in the HPI and otherwise negative.   BP 118/60  Pulse 58  Ht _0  (1.575 m)  Wt 195 lb (88.451 kg)  BMI 35.66 kg/m2  Physical Examination: General: Well developed, well nourished, NAD HEENT: OP clear, mucus membranes moist SKIN: warm, dry. No rashes. Neuro: No focal deficits Musculoskeletal: Muscle strength 5/5 all ext Psychiatric: Mood  and affect  normal Neck: No JVD, no carotid bruits, no thyromegaly, no lymphadenopathy. Lungs:Clear bilaterally, no wheezes, rhonci, crackles Cardiovascular: Regular rate and rhythm. No murmurs, gallops or rubs. Abdomen:Soft. Bowel sounds present. Non-tender.  Extremities: Trace to 1+ bilateral lower extremity edema. Pulses are 2 + in the bilateral DP/PT.  Cardiac cath 06/12/11: 2. The left main is free of critical disease.  3. The LAD has a bifurcation stenosis. There is about 80% involvement  of the LAD and then 80% involvement of diagonal. There is 80%  lesion in the LAD just beyond the diagonal. The distal LAD is  suitable for grafting.  4. The circumflex provides a first marginal that has probably 80%  ostial and then a 70% mid lesion. The remaining portion of the  circumflex is fairly large and is without critical narrowing other  than minor luminal irregularity.  5. The right coronary artery has clear-cut 70-80% ostial stenosis.  There is also a 60-70% mid vessel stenosis. The PDA and PLA are  suitable for grafting.  6. Ventriculography in the RAO projection reveals well-preserved left  ventricular function with a normal ejection fraction 55% by  estimate. I did not appreciate significant regurgitation.  Echo 07/29/13: Left ventricle: The cavity size was normal. Wall thickness was increased in a pattern of mild LVH. Systolic function was normal. The estimated ejection fraction was in the range of 50% to 55%. Doppler parameters are consistent with abnormal left ventricular relaxation (grade 1 diastolic dysfunction). - Pulmonary arteries: PA peak pressure: 85m Hg (S).  Assessment and Plan:   1. CAD: Stable. NO recent angina. Will continue medical management. Echo November 2014 with normal LV function  2. HTN: BP is controlled. Cozaar was increased in primary care.   3. Hyperlipidemia: Continue statin. Well controlled in primary care  4. LBBB: Chronic  5. Carotid artery disease:  Mild by dopplers December 2014.    6. Chronic diastolic CHF: Some increase in lower ext edema. Will increase Lasix to 40 mg daily.

## 2014-01-14 NOTE — Patient Instructions (Signed)
Your physician wants you to follow-up in: 6 months.   You will receive a reminder letter in the mail two months in advance. If you don't receive a letter, please call our office to schedule the follow-up appointment.  Your physician has recommended you make the following change in your medication: Increase furosemide to 40 mg by mouth daily.  Your physician recommends that you return for lab work in: 1 week--Jan 21, 2014.  The lab opens at 7:30 AM

## 2014-01-15 ENCOUNTER — Other Ambulatory Visit: Payer: Self-pay | Admitting: Cardiovascular Disease

## 2014-01-21 ENCOUNTER — Other Ambulatory Visit (INDEPENDENT_AMBULATORY_CARE_PROVIDER_SITE_OTHER): Payer: Medicare Other

## 2014-01-21 DIAGNOSIS — I5032 Chronic diastolic (congestive) heart failure: Secondary | ICD-10-CM | POA: Diagnosis not present

## 2014-01-21 DIAGNOSIS — I2510 Atherosclerotic heart disease of native coronary artery without angina pectoris: Secondary | ICD-10-CM

## 2014-01-21 LAB — BASIC METABOLIC PANEL
BUN: 22 mg/dL (ref 6–23)
CO2: 27 meq/L (ref 19–32)
Calcium: 9.8 mg/dL (ref 8.4–10.5)
Chloride: 106 mEq/L (ref 96–112)
Creatinine, Ser: 0.9 mg/dL (ref 0.4–1.2)
GFR: 62.57 mL/min (ref >60.00)
GLUCOSE: 133 mg/dL — AB (ref 70–99)
POTASSIUM: 4 meq/L (ref 3.5–5.1)
SODIUM: 140 meq/L (ref 135–145)

## 2014-01-21 NOTE — Progress Notes (Signed)
Quick Note:  Preliminary report reviewed by triage nurse and sent to MD desk. ______ 

## 2014-03-13 DIAGNOSIS — E039 Hypothyroidism, unspecified: Secondary | ICD-10-CM | POA: Diagnosis not present

## 2014-03-13 DIAGNOSIS — E785 Hyperlipidemia, unspecified: Secondary | ICD-10-CM | POA: Diagnosis not present

## 2014-03-13 DIAGNOSIS — E119 Type 2 diabetes mellitus without complications: Secondary | ICD-10-CM | POA: Diagnosis not present

## 2014-03-17 DIAGNOSIS — I10 Essential (primary) hypertension: Secondary | ICD-10-CM | POA: Diagnosis not present

## 2014-03-17 DIAGNOSIS — E039 Hypothyroidism, unspecified: Secondary | ICD-10-CM | POA: Diagnosis not present

## 2014-03-17 DIAGNOSIS — E785 Hyperlipidemia, unspecified: Secondary | ICD-10-CM | POA: Diagnosis not present

## 2014-03-17 DIAGNOSIS — E119 Type 2 diabetes mellitus without complications: Secondary | ICD-10-CM | POA: Diagnosis not present

## 2014-03-20 ENCOUNTER — Other Ambulatory Visit: Payer: Self-pay | Admitting: Oncology

## 2014-04-04 ENCOUNTER — Other Ambulatory Visit: Payer: Self-pay | Admitting: *Deleted

## 2014-04-04 DIAGNOSIS — C50912 Malignant neoplasm of unspecified site of left female breast: Secondary | ICD-10-CM

## 2014-04-07 ENCOUNTER — Other Ambulatory Visit (HOSPITAL_BASED_OUTPATIENT_CLINIC_OR_DEPARTMENT_OTHER): Payer: Medicare Other

## 2014-04-07 DIAGNOSIS — C50519 Malignant neoplasm of lower-outer quadrant of unspecified female breast: Secondary | ICD-10-CM

## 2014-04-07 DIAGNOSIS — C50912 Malignant neoplasm of unspecified site of left female breast: Secondary | ICD-10-CM

## 2014-04-07 LAB — CBC WITH DIFFERENTIAL/PLATELET
BASO%: 1 % (ref 0.0–2.0)
BASOS ABS: 0.1 10*3/uL (ref 0.0–0.1)
EOS%: 1.9 % (ref 0.0–7.0)
Eosinophils Absolute: 0.1 10*3/uL (ref 0.0–0.5)
HEMATOCRIT: 43.7 % (ref 34.8–46.6)
HEMOGLOBIN: 14.2 g/dL (ref 11.6–15.9)
LYMPH%: 28.4 % (ref 14.0–49.7)
MCH: 28.7 pg (ref 25.1–34.0)
MCHC: 32.6 g/dL (ref 31.5–36.0)
MCV: 88.1 fL (ref 79.5–101.0)
MONO#: 0.4 10*3/uL (ref 0.1–0.9)
MONO%: 6.5 % (ref 0.0–14.0)
NEUT#: 3.7 10*3/uL (ref 1.5–6.5)
NEUT%: 62.2 % (ref 38.4–76.8)
Platelets: 239 10*3/uL (ref 145–400)
RBC: 4.95 10*6/uL (ref 3.70–5.45)
RDW: 14 % (ref 11.2–14.5)
WBC: 6 10*3/uL (ref 3.9–10.3)
lymph#: 1.7 10*3/uL (ref 0.9–3.3)

## 2014-04-07 LAB — COMPREHENSIVE METABOLIC PANEL (CC13)
ALT: 22 U/L (ref 0–55)
AST: 21 U/L (ref 5–34)
Albumin: 3.7 g/dL (ref 3.5–5.0)
Alkaline Phosphatase: 80 U/L (ref 40–150)
Anion Gap: 9 mEq/L (ref 3–11)
BUN: 17.8 mg/dL (ref 7.0–26.0)
CALCIUM: 9.8 mg/dL (ref 8.4–10.4)
CHLORIDE: 108 meq/L (ref 98–109)
CO2: 26 meq/L (ref 22–29)
Creatinine: 0.9 mg/dL (ref 0.6–1.1)
GLUCOSE: 132 mg/dL (ref 70–140)
Potassium: 3.6 mEq/L (ref 3.5–5.1)
Sodium: 143 mEq/L (ref 136–145)
Total Bilirubin: 1.02 mg/dL (ref 0.20–1.20)
Total Protein: 7 g/dL (ref 6.4–8.3)

## 2014-04-14 ENCOUNTER — Ambulatory Visit: Payer: Medicare Other | Admitting: Physician Assistant

## 2014-04-18 DIAGNOSIS — L821 Other seborrheic keratosis: Secondary | ICD-10-CM | POA: Diagnosis not present

## 2014-04-18 DIAGNOSIS — D1801 Hemangioma of skin and subcutaneous tissue: Secondary | ICD-10-CM | POA: Diagnosis not present

## 2014-04-18 DIAGNOSIS — D239 Other benign neoplasm of skin, unspecified: Secondary | ICD-10-CM | POA: Diagnosis not present

## 2014-04-18 DIAGNOSIS — L570 Actinic keratosis: Secondary | ICD-10-CM | POA: Diagnosis not present

## 2014-04-18 DIAGNOSIS — L408 Other psoriasis: Secondary | ICD-10-CM | POA: Diagnosis not present

## 2014-04-18 DIAGNOSIS — Z85828 Personal history of other malignant neoplasm of skin: Secondary | ICD-10-CM | POA: Diagnosis not present

## 2014-04-18 DIAGNOSIS — L819 Disorder of pigmentation, unspecified: Secondary | ICD-10-CM | POA: Diagnosis not present

## 2014-04-21 ENCOUNTER — Encounter: Payer: Self-pay | Admitting: Nurse Practitioner

## 2014-04-21 ENCOUNTER — Telehealth: Payer: Self-pay | Admitting: *Deleted

## 2014-04-21 ENCOUNTER — Telehealth: Payer: Self-pay | Admitting: Oncology

## 2014-04-21 ENCOUNTER — Ambulatory Visit (HOSPITAL_BASED_OUTPATIENT_CLINIC_OR_DEPARTMENT_OTHER): Payer: Medicare Other | Admitting: Nurse Practitioner

## 2014-04-21 VITALS — BP 143/43 | HR 58 | Temp 98.7°F | Resp 18 | Ht 62.0 in | Wt 188.5 lb

## 2014-04-21 DIAGNOSIS — Z79811 Long term (current) use of aromatase inhibitors: Secondary | ICD-10-CM

## 2014-04-21 DIAGNOSIS — M810 Age-related osteoporosis without current pathological fracture: Secondary | ICD-10-CM | POA: Diagnosis not present

## 2014-04-21 DIAGNOSIS — N898 Other specified noninflammatory disorders of vagina: Secondary | ICD-10-CM

## 2014-04-21 DIAGNOSIS — Z853 Personal history of malignant neoplasm of breast: Secondary | ICD-10-CM | POA: Diagnosis not present

## 2014-04-21 DIAGNOSIS — N9489 Other specified conditions associated with female genital organs and menstrual cycle: Secondary | ICD-10-CM

## 2014-04-21 DIAGNOSIS — C50512 Malignant neoplasm of lower-outer quadrant of left female breast: Secondary | ICD-10-CM

## 2014-04-21 NOTE — Progress Notes (Signed)
ID: [name]   DOB: 07/29/1933  MR#: [id number]  PFX#:[id number]  PCP: Katherine Patricia, MD GYN: SU:  OTHER MD: [name]  CHIEF COMPLAINT:  Left breast cancer  CURRENT THERAPY: anastrozole 26m daily   BREAST CANCER HISTORY: The patient was noted to have left breast calcifications which have been followed on a every 6 month basis, most recently 10/11/2011. Bilateral diagnostic mammography on that date showed a mass in the medial left breast and a separate area of linear calcifications. The right breast was unremarkable. Ultrasound showed a simple cysts correlating with the mass seen on mammography. The area of abnormal calcifications in the lower outer quadrant was biopsied 10/20/2011 and showed ([id number]) and invasive ductal carcinoma, which appeared low to intermediate grade, and which was estrogen receptor positive at 98%, progesterone receptor positive at 54%, with no HER-2 amplification, and with an MIB-1 of 47%  On 10/27/2011 the patient underwent bilateral breast MRIs. This showed post biopsy change but also a 3 mm focus of enhancement in the lateral aspect of the biopsy cavity. There was no MRI evidence for multifocal, or contralateral malignancy or for lymph node spread. Accordingly, on 11/21/2011 the patient underwent left lumpectomy and sentinel lymph node sampling under Dr. TGeorgette [name] This showed ([id number]) high-grade invasive ductal carcinoma, measuring 1.1 cm, with 0 of 2 lymph nodes negative, and ample margins. Repeat HER-2 testing again showed no amplification.  Her subsequent history is as detailed below.  INTERVAL HISTORY:  [name] for routine follow up of her left breast cancer. She has been on Anastrozole since June 2013 and tolerates it well except for the occasional hot flash. She states they're usually after her morning shower, and are otherwise not bothersome. She also complains of mild vaginal dryness, for which she has not been treating  with anything. [name] swims daily in her neighborhood pool and participates in aerobic exercise. She will find an indoor activity with the season cools.   REVIEW OF SYSTEMS: [name] a history of diverticulitis and awoke this morning with gas pains. She has had 2 bowel movements this morning. She states this will likely pass, and she is not worried. A detailed review of systems was otherwise noncontributory.   PAST MEDICAL HISTORY: Past Medical History  Diagnosis Date  . Diverticulitis of colon with perforation   . S/P CABG x 2 06/2011    Dr. G[facility] Sn[facility]  . Carotid stenosis     dopplers 132/99 LICA 424-26% . DM2 (diabetes mellitus, type 2)   . HTN (hypertension)   . HLD (hyperlipidemia)   . Hypothyroidism   . RLS (restless legs syndrome)   . Breast cancer, left breast 11/01/2011  . CAD (coronary artery disease)     LHC 06/14/11: LAD 80% and 80% after Dx, Dx 80% (Dx was small), oOM 80%, mOM 70% (OM was small), oRCA 70-80%, mRCA 60-70%, EF 55% - referred for CABG;   Echo 06/14/11: mild focal basal septal hypertrophy, grade 1 diast dysfxn, mild LAE, mild RVE, PASP 37.    . Kidney stones   . GERD (gastroesophageal reflux disease)   . Psoriasis   . Diabetes mellitus     x 5-6 yrs  . Breast cancer 10/20/11    L breast, ER/PR+, HER2 -  . S/P radiation therapy 12/20/11 - 01/10/12    Left Breast: 4256 cGy/ 16 Fractions    PAST SURGICAL HISTORY: Past Surgical History  Procedure Laterality Date  . Colon resection  1977  .  Arthroscopy right knee    . Off-pump coronary artery bypass grafting x2  06/16/2011  . Vein bypass surgery  1999  . Colon surgery      part colectomy-  . Appendectomy    . Coronary artery bypass graft  10/12  . Breast biospy  11/04/11    Breast, Left, Needle Core Biopsy 6 Oclock, Benign Breast Tissue wtih Fibrocystic  Changes: Microcalcifications Identified, No Neoplasm or Malignancy Identified  . Breast biospy  10/20/11    Breast, Left, Needle Core Biopsy  LOQ: Invsive ductal Carcinoman  . Breast lumpectomy  11/22/11    Breast, Lumpectomy, Left with Sentinel Node Biopsy: Invasive High Grade Ductal Carcinoma : No lymphovascular Invasion: High  Grade Ductal Carcinoma In-Situ with Calcification and Comedo Necrosis  . Abdominal hysterectomy      age 81's    FAMILY HISTORY Family History  Problem Relation Age of Onset  . Cancer Mother     throat  . Hypertension Brother   . Cancer Paternal Uncle    the patient's father died at the age of 86 from a subarachnoid hemorrhage. The patient's mother died at the age of 33 from congestive heart failure. She had a history of throat cancer. The patient has one brother, currently 3 years old. She had no sisters. There is no history of breast or ovarian cancer in the family  GYNECOLOGIC HISTORY: She had menarche age 89. She is GX P2, first pregnancy to term age 62. She underwent hysterectomy around age 26 to do to menorrhagia. 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 Her husband of 58+ years, [name] (goes by [name]) used to work for [employer]. Their son [name] lives in [place] and works for [employer]. The patient's other child was stillborn. She has no grandchildren. She belongs to the [employer] and wants me to know that on Saturdays starting in May from 8 AM to 2:30 PM they have a hot dog sale that is considered the best in [place] (actually featured in the [place] this month)  ADVANCED DIRECTIVES: In place  HEALTH MAINTENANCE: History  Substance Use Topics  . Smoking status: Former Smoker -- 1.00 packs/day for 6 years    Types: Cigarettes    Quit date: 09/05/1980  . Smokeless tobacco: Not on file  . Alcohol Use: Yes     Comment: socially     Colonoscopy: "about 2007, no need to repeat"  PAP: s/p hysterectomy  Bone density: At the breast center 09/12/2008,  showing significant osteopenia  Lipid panel: Under treatment  No Known Allergies  Current Outpatient Prescriptions  Medication Sig Dispense Refill  . anastrozole (ARIMIDEX) 1 MG tablet TAKE 1 TABLET DAILY  90 tablet  2  . aspirin 81 MG tablet Take 81 mg by mouth daily.        Marland Kitchen CALCIUM PO Take 2 capsules by mouth at bedtime.      . Canagliflozin (INVOKANA) 300 MG TABS Take 1 tablet by mouth every morning.       . Cholecalciferol (VITAMIN D-3) 5000 UNITS TABS Take 1 tablet by mouth daily.      . CRESTOR 20 MG tablet Take 20 mg by mouth daily.       Marland Kitchen esomeprazole (NEXIUM) 40 MG capsule Take 40 mg by mouth daily before breakfast.        . ezetimibe (  ZETIA) 10 MG tablet Take 10 mg by mouth daily.        . furosemide (LASIX) 40 MG tablet Take 1 tablet (40 mg total) by mouth daily.  90 tablet  3  . KLOR-CON M10 10 MEQ tablet TAKE 1 TABLET DAILY  90 tablet  1  . levothyroxine (SYNTHROID, LEVOTHROID) 137 MCG tablet Take 137 mcg by mouth daily before breakfast.      . losartan (COZAAR) 50 MG tablet Take 1 tablet (50 mg total) by mouth 2 (two) times daily.  180 tablet  3  . metoprolol (LOPRESSOR) 50 MG tablet Take one and one-half tablet by mouth twice a day  270 tablet  3  . Multiple Vitamin (MULTIVITAMIN) capsule Take 1 capsule by mouth daily.        . Multiple Vitamins-Minerals (EYE VITAMINS) CAPS Take by mouth.      . pioglitazone (ACTOS) 15 MG tablet Take 15 mg by mouth daily.      Marland Kitchen rOPINIRole (REQUIP) 3 MG tablet Take 3 mg by mouth at bedtime.       . cetirizine (ZYRTEC) 10 MG tablet Take 10 mg by mouth as needed.       . diazepam (VALIUM) 5 MG tablet Take 1 tablet (5 mg total) by mouth every 6 (six) hours as needed.  30 tablet  0  . HYDROcodone-acetaminophen (VICODIN) 5-500 MG per tablet Take 1 tablet by mouth every 6 (six) hours as needed.       No current facility-administered medications for this visit.    OBJECTIVE: Middle-aged white [name] who appears stated age 80 Vitals:    04/21/14 1008  BP: 143/43  Pulse: 58  Temp: 98.7 F (37.1 C)  Resp: 18     Body mass index is 34.47 kg/(m^2).    ECOG FS: 1 Filed Weights   04/21/14 1008  Weight: 188 lb 8 oz (85.503 kg)   Skin: warm, dry  HEENT: sclerae anicteric, conjunctivae pink, oropharynx clear. No thrush or mucositis.  Lymph Nodes: No cervical or supraclavicular lymphadenopathy  Lungs: clear to auscultation bilaterally, no rales, wheezes, or rhonci  Heart: regular rate and rhythm  Abdomen: round, soft, non tender, positive bowel sounds  Musculoskeletal: No focal spinal tenderness, no peripheral edema  Neuro: non focal, well oriented, positive affect  Breast: left breast status post lumpectomy and radiation, no evidence of local recurrence. Left axilla benign. Right breast unremarkable.  LAB RESULTS: Lab Results  Component Value Date   WBC 6.0 04/07/2014   NEUTROABS 3.7 04/07/2014   HGB 14.2 04/07/2014   HCT 43.7 04/07/2014   MCV 88.1 04/07/2014   PLT 239 04/07/2014      Chemistry      Component Value Date/Time   NA 143 04/07/2014 0935   NA 140 01/21/2014 0944   K 3.6 04/07/2014 0935   K 4.0 01/21/2014 0944   CL 106 01/21/2014 0944   CL 105 10/17/2012 0912   CO2 26 04/07/2014 0935   CO2 27 01/21/2014 0944   BUN 17.8 04/07/2014 0935   BUN 22 01/21/2014 0944   CREATININE 0.9 04/07/2014 0935   CREATININE 0.9 01/21/2014 0944      Component Value Date/Time   CALCIUM 9.8 04/07/2014 0935   CALCIUM 9.8 01/21/2014 0944   ALKPHOS 80 04/07/2014 0935   ALKPHOS 109 12/07/2011 0914   AST 21 04/07/2014 0935   AST 20 12/07/2011 0914   ALT 22 04/07/2014 0935   ALT 16 12/07/2011 0914   BILITOT 1.02  04/07/2014 0935   BILITOT 0.7 12/07/2011 0914       Lab Results  Component Value Date   LABCA2 14 10/17/2012     STUDIES:  Most recent bone density performed on 12/21/11 showed a t-score of -2.7 (osteoporosis).  Most recent mammogram on 11/08/2013 was unremarkable.    ASSESSMENT: 80 y.o.  [name],   (1)  status post left lumpectomy  and sentinel lymph node sampling 11/21/2011 for a T1c N0, stage IA invasive ductal carcinoma, grade 3, strongly estrogen and progesterone receptor positive, with an MIB-1 of 47%, and no HER-2 amplification.   (2)  Completed radiation 01/10/2012 and started anastrozole June 2013  (3)  osteoporosis, receiving zoledronic acid annually, first dose given on 05/24/2012.  PLAN:  [name] is doing well as far as her breast cancer is concerned. The labs were reviewed in detail with her and were stable. I taught her about water-based lubrication or coconut oil for her vaginal dryness. She is out of date for a bone density scan, so one has been ordered today for the first available appointment. She is due for a zoledronic acid infusion in Sept, which she receives annually.   The plan is to continue the anastrozole for a total of 5 years. She will return next August for labs and an office visit with Dr. Jana Hakim. [name] understands and agrees with this plan. She was encouraged to call with any issues that arise before her next visit here.    Marcelino Duster    04/21/2014

## 2014-04-21 NOTE — Telephone Encounter (Signed)
Per staff message and POF I have scheduled appts. Advised scheduler of appts. JMW  

## 2014-04-21 NOTE — Telephone Encounter (Signed)
, °

## 2014-04-29 ENCOUNTER — Ambulatory Visit
Admission: RE | Admit: 2014-04-29 | Discharge: 2014-04-29 | Disposition: A | Discharge status: Home / Self Care | Payer: Medicare Other | Source: Ambulatory Visit | Attending: Nurse Practitioner | Admitting: Nurse Practitioner

## 2014-04-29 DIAGNOSIS — M810 Age-related osteoporosis without current pathological fracture: Secondary | ICD-10-CM

## 2014-04-29 DIAGNOSIS — C50512 Malignant neoplasm of lower-outer quadrant of left female breast: Secondary | ICD-10-CM

## 2014-05-19 ENCOUNTER — Ambulatory Visit (HOSPITAL_BASED_OUTPATIENT_CLINIC_OR_DEPARTMENT_OTHER): Payer: Medicare Other

## 2014-05-19 VITALS — BP 143/56 | HR 62 | Temp 97.4°F | Resp 18

## 2014-05-19 DIAGNOSIS — C50912 Malignant neoplasm of unspecified site of left female breast: Secondary | ICD-10-CM

## 2014-05-19 DIAGNOSIS — M810 Age-related osteoporosis without current pathological fracture: Secondary | ICD-10-CM | POA: Diagnosis not present

## 2014-05-19 DIAGNOSIS — C50919 Malignant neoplasm of unspecified site of unspecified female breast: Secondary | ICD-10-CM

## 2014-05-19 MED ORDER — SODIUM CHLORIDE 0.9 % IV SOLN
Freq: Once | INTRAVENOUS | Status: AC
  Administered 2014-05-19: 11:00:00 via INTRAVENOUS

## 2014-05-19 MED ORDER — ZOLEDRONIC ACID 4 MG/100ML IV SOLN
4.0000 mg | Freq: Once | INTRAVENOUS | Status: AC
  Administered 2014-05-19: 4 mg via INTRAVENOUS
  Filled 2014-05-19: qty 100

## 2014-05-19 NOTE — Patient Instructions (Signed)

## 2014-05-19 NOTE — Progress Notes (Signed)
Pt declined flu vaccine today; states she will get one at her PCP next month.

## 2014-05-23 DIAGNOSIS — S239XXA Sprain of unspecified parts of thorax, initial encounter: Secondary | ICD-10-CM | POA: Diagnosis not present

## 2014-06-16 ENCOUNTER — Other Ambulatory Visit: Payer: Self-pay | Admitting: Cardiovascular Disease

## 2014-06-16 DIAGNOSIS — Z961 Presence of intraocular lens: Secondary | ICD-10-CM | POA: Diagnosis not present

## 2014-06-16 DIAGNOSIS — E119 Type 2 diabetes mellitus without complications: Secondary | ICD-10-CM | POA: Diagnosis not present

## 2014-06-17 DIAGNOSIS — E039 Hypothyroidism, unspecified: Secondary | ICD-10-CM | POA: Diagnosis not present

## 2014-06-17 DIAGNOSIS — E785 Hyperlipidemia, unspecified: Secondary | ICD-10-CM | POA: Diagnosis not present

## 2014-06-17 DIAGNOSIS — E119 Type 2 diabetes mellitus without complications: Secondary | ICD-10-CM | POA: Diagnosis not present

## 2014-06-20 DIAGNOSIS — I10 Essential (primary) hypertension: Secondary | ICD-10-CM | POA: Diagnosis not present

## 2014-06-20 DIAGNOSIS — E782 Mixed hyperlipidemia: Secondary | ICD-10-CM | POA: Diagnosis not present

## 2014-06-20 DIAGNOSIS — Z23 Encounter for immunization: Secondary | ICD-10-CM | POA: Diagnosis not present

## 2014-06-20 DIAGNOSIS — E038 Other specified hypothyroidism: Secondary | ICD-10-CM | POA: Diagnosis not present

## 2014-06-20 DIAGNOSIS — E119 Type 2 diabetes mellitus without complications: Secondary | ICD-10-CM | POA: Diagnosis not present

## 2014-07-03 ENCOUNTER — Other Ambulatory Visit: Payer: Self-pay | Admitting: Cardiovascular Disease

## 2014-07-03 DIAGNOSIS — S40011A Contusion of right shoulder, initial encounter: Secondary | ICD-10-CM | POA: Diagnosis not present

## 2014-07-14 ENCOUNTER — Emergency Department (HOSPITAL_COMMUNITY)
Admission: EM | Admit: 2014-07-14 | Discharge: 2014-07-14 | Disposition: A | Discharge status: Home / Self Care | Payer: Medicare Other | Attending: Emergency Medicine | Admitting: Emergency Medicine

## 2014-07-14 ENCOUNTER — Emergency Department (HOSPITAL_COMMUNITY): Payer: Medicare Other

## 2014-07-14 ENCOUNTER — Encounter (HOSPITAL_COMMUNITY): Payer: Self-pay | Admitting: Emergency Medicine

## 2014-07-14 DIAGNOSIS — R0789 Other chest pain: Secondary | ICD-10-CM | POA: Insufficient documentation

## 2014-07-14 DIAGNOSIS — E119 Type 2 diabetes mellitus without complications: Secondary | ICD-10-CM | POA: Insufficient documentation

## 2014-07-14 DIAGNOSIS — I2510 Atherosclerotic heart disease of native coronary artery without angina pectoris: Secondary | ICD-10-CM | POA: Insufficient documentation

## 2014-07-14 DIAGNOSIS — R0781 Pleurodynia: Secondary | ICD-10-CM

## 2014-07-14 DIAGNOSIS — M546 Pain in thoracic spine: Secondary | ICD-10-CM | POA: Insufficient documentation

## 2014-07-14 DIAGNOSIS — Z923 Personal history of irradiation: Secondary | ICD-10-CM | POA: Diagnosis not present

## 2014-07-14 DIAGNOSIS — Z872 Personal history of diseases of the skin and subcutaneous tissue: Secondary | ICD-10-CM | POA: Diagnosis not present

## 2014-07-14 DIAGNOSIS — Z7982 Long term (current) use of aspirin: Secondary | ICD-10-CM | POA: Diagnosis not present

## 2014-07-14 DIAGNOSIS — Z87891 Personal history of nicotine dependence: Secondary | ICD-10-CM | POA: Insufficient documentation

## 2014-07-14 DIAGNOSIS — M549 Dorsalgia, unspecified: Secondary | ICD-10-CM

## 2014-07-14 DIAGNOSIS — Z951 Presence of aortocoronary bypass graft: Secondary | ICD-10-CM | POA: Diagnosis not present

## 2014-07-14 DIAGNOSIS — Z79899 Other long term (current) drug therapy: Secondary | ICD-10-CM | POA: Diagnosis not present

## 2014-07-14 DIAGNOSIS — K219 Gastro-esophageal reflux disease without esophagitis: Secondary | ICD-10-CM | POA: Diagnosis not present

## 2014-07-14 DIAGNOSIS — Z853 Personal history of malignant neoplasm of breast: Secondary | ICD-10-CM | POA: Insufficient documentation

## 2014-07-14 DIAGNOSIS — Z87442 Personal history of urinary calculi: Secondary | ICD-10-CM | POA: Diagnosis not present

## 2014-07-14 DIAGNOSIS — E039 Hypothyroidism, unspecified: Secondary | ICD-10-CM | POA: Insufficient documentation

## 2014-07-14 DIAGNOSIS — I10 Essential (primary) hypertension: Secondary | ICD-10-CM | POA: Diagnosis not present

## 2014-07-14 DIAGNOSIS — M25511 Pain in right shoulder: Secondary | ICD-10-CM | POA: Diagnosis not present

## 2014-07-14 LAB — I-STAT CHEM 8, ED
BUN: 21 mg/dL (ref 6–23)
CALCIUM ION: 1.11 mmol/L — AB (ref 1.13–1.30)
CREATININE: 0.8 mg/dL (ref 0.50–1.10)
Chloride: 101 mEq/L (ref 96–112)
GLUCOSE: 107 mg/dL — AB (ref 70–99)
HCT: 42 % (ref 36.0–46.0)
HEMOGLOBIN: 14.3 g/dL (ref 12.0–15.0)
Potassium: 3.7 mEq/L (ref 3.7–5.3)
SODIUM: 137 meq/L (ref 137–147)
TCO2: 23 mmol/L (ref 0–100)

## 2014-07-14 LAB — SEDIMENTATION RATE: Sed Rate: 22 mm/hr (ref 0–22)

## 2014-07-14 LAB — I-STAT TROPONIN, ED: Troponin i, poc: 0.01 ng/mL (ref 0.00–0.08)

## 2014-07-14 MED ORDER — DIAZEPAM 5 MG PO TABS: 5.0000 mg | ORAL_TABLET | Freq: Two times a day (BID) | ORAL | Status: DC

## 2014-07-14 MED ORDER — DIAZEPAM 5 MG PO TABS
5.0000 mg | ORAL_TABLET | Freq: Once | ORAL | Status: AC
  Administered 2014-07-14: 5 mg via ORAL
  Filled 2014-07-14: qty 1

## 2014-07-14 MED ORDER — IOHEXOL 350 MG/ML SOLN
100.0000 mL | Freq: Once | INTRAVENOUS | Status: AC | PRN
  Administered 2014-07-14: 100 mL via INTRAVENOUS

## 2014-07-14 MED ORDER — OXYCODONE-ACETAMINOPHEN 5-325 MG PO TABS: 1.0000 | ORAL_TABLET | Freq: Four times a day (QID) | ORAL | Status: DC | PRN

## 2014-07-14 MED ORDER — ONDANSETRON HCL 4 MG/2ML IJ SOLN
4.0000 mg | Freq: Once | INTRAMUSCULAR | Status: AC
  Administered 2014-07-14: 4 mg via INTRAVENOUS
  Filled 2014-07-14: qty 2

## 2014-07-14 MED ORDER — SODIUM CHLORIDE 0.9 % IV SOLN
INTRAVENOUS | Status: DC
  Administered 2014-07-14: 21:00:00 via INTRAVENOUS

## 2014-07-14 MED ORDER — FENTANYL CITRATE 0.05 MG/ML IJ SOLN
50.0000 ug | Freq: Once | INTRAMUSCULAR | Status: AC
  Administered 2014-07-14: 50 ug via INTRAVENOUS
  Filled 2014-07-14: qty 2

## 2014-07-14 NOTE — ED Provider Notes (Signed)
  Face-to-face evaluation   History: she complains of right upper back pain for about a day and a half.  No recent trauma.  Fall 2 weeks ago injuring right shoulder and treated with Norco.  She took a Norco today, without relief.  She denies shortness of breath at rest.  Her pain is worse, with deep breathing.  The pain never goes away.  Physical exam:alert elderly female who is appears uncomfortable.  Normal range of motion.  Right shoulder.  No localized tenderness to palpation of the left or right upper back.  No overlying skin abnormality of the right upper back.   Medical screening examination/treatment/procedure(s) were conducted as a shared visit with non-physician practitioner(s) and myself.  I personally evaluated the patient during the encounter    Richarda Blade, MD 07/14/14 [phone]

## 2014-07-14 NOTE — ED Notes (Addendum)
Pt c/o upper back pain, has been on and off for the past couple of days, Pt states she fell about 2 weeks ago and was seen at urgent care with negative results. Pt states pain wont ease off today. Pt took 1 vicodin about 1 hour ago.

## 2014-07-14 NOTE — ED Provider Notes (Signed)
CSN: [id number]     Arrival date & time 07/14/14  1639 History   First MD Initiated Contact with Patient 07/14/14 1703     Chief Complaint  Patient presents with  . Back Pain   HPI  Patient is a 80 y.o. Female who presents to the ED with right subthoracic back pain.  Patient states that she fell approximately two weeks ago and landed on her right side.  She immediately felt right shoulder pain.  She was evaluated by an outside UC facility for said fall and had an xray of her right shoulder which was negative.  Her shoulder is now feeling better but she is now experiencing right sided subthoracic pain which is sharp and stabbing in nature.  She has tried vicoden at home with little relief.  Patient states that she has had no other injuries to her back.     Past Medical History  Diagnosis Date  . Diverticulitis of colon with perforation   . S/P CABG x 2 06/2011    Dr. Servando Snare (L-LAD, [facility])  . Carotid stenosis     dopplers 19/75: LICA 88-32%  . DM2 (diabetes mellitus, type 2)   . HTN (hypertension)   . HLD (hyperlipidemia)   . Hypothyroidism   . RLS (restless legs syndrome)   . Breast cancer, left breast 11/01/2011  . CAD (coronary artery disease)     LHC 06/14/11: LAD 80% and 80% after Dx, Dx 80% (Dx was small), oOM 80%, mOM 70% (OM was small), oRCA 70-80%, mRCA 60-70%, EF 55% - referred for CABG;   Echo 06/14/11: mild focal basal septal hypertrophy, grade 1 diast dysfxn, mild LAE, mild RVE, PASP 37.    . Kidney stones   . GERD (gastroesophageal reflux disease)   . Psoriasis   . Diabetes mellitus     x 5-6 yrs  . Breast cancer 10/20/11    L breast, ER/PR+, HER2 -  . S/P radiation therapy 12/20/11 - 01/10/12    Left Breast: 5498 cGy/ 16 Fractions   Past Surgical History  Procedure Laterality Date  . Colon resection  1977  . Arthroscopy right knee    . Off-pump coronary artery bypass grafting x2  06/16/2011  . Vein bypass surgery  1999  . Colon surgery      part colectomy-  .  Appendectomy    . Coronary artery bypass graft  10/12  . Breast biospy  11/04/11    Breast, Left, Needle Core Biopsy 6 Oclock, Benign Breast Tissue wtih Fibrocystic  Changes: Microcalcifications Identified, No Neoplasm or Malignancy Identified  . Breast biospy  10/20/11    Breast, Left, Needle Core Biopsy LOQ: Invsive ductal Carcinoman  . Breast lumpectomy  11/22/11    Breast, Lumpectomy, Left with Sentinel Node Biopsy: Invasive High Grade Ductal Carcinoma : No lymphovascular Invasion: High  Grade Ductal Carcinoma In-Situ with Calcification and Comedo Necrosis  . Abdominal hysterectomy      age 89's   Family History  Problem Relation Age of Onset  . Cancer Mother     throat  . Hypertension Brother   . Cancer Paternal Uncle    History  Substance Use Topics  . Smoking status: Former Smoker -- 1.00 packs/day for 6 years    Types: Cigarettes    Quit date: 09/05/1980  . Smokeless tobacco: Not on file  . Alcohol Use: Yes     Comment: socially   OB History    No data available     Review  of Systems  Constitutional: Negative for fever, chills and fatigue.  Respiratory: Negative for chest tightness and shortness of breath.   Cardiovascular: Negative for chest pain and palpitations.  Gastrointestinal: Negative for nausea, vomiting, abdominal pain, diarrhea, constipation, blood in stool and anal bleeding.  Musculoskeletal: Positive for back pain and arthralgias. Negative for myalgias, joint swelling, gait problem, neck pain and neck stiffness.  Skin: Negative for rash.  All other systems reviewed and are negative.     Allergies  Review of patient's allergies indicates no known allergies.  Home Medications   Prior to Admission medications   Medication Sig Start Date End Date Taking? Authorizing Provider  anastrozole (ARIMIDEX) 1 MG tablet Take 1 mg by mouth daily.   Yes Historical Provider, MD  aspirin 81 MG tablet Take 81 mg by mouth daily.     Yes Historical Provider, MD    CALCITRIOL PO Take 2 capsules by mouth daily.   Yes Historical Provider, MD  CALCIUM PO Take 2 capsules by mouth at bedtime.   Yes Historical Provider, MD  Canagliflozin (INVOKANA) 300 MG TABS Take 1 tablet by mouth every morning.    Yes Historical Provider, MD  Cholecalciferol (VITAMIN D-3) 5000 UNITS TABS Take 1 tablet by mouth daily.   Yes Historical Provider, MD  CRESTOR 20 MG tablet Take 20 mg by mouth daily.  11/10/11  Yes Historical Provider, MD  esomeprazole (NEXIUM) 40 MG capsule Take 40 mg by mouth daily before breakfast.     Yes Historical Provider, MD  ezetimibe (ZETIA) 10 MG tablet Take 10 mg by mouth daily.     Yes Historical Provider, MD  furosemide (LASIX) 40 MG tablet Take 1 tablet (40 mg total) by mouth daily. 01/14/14  Yes Burnell Blanks, MD  HYDROcodone-acetaminophen (VICODIN) 5-500 MG per tablet Take 1 tablet by mouth every 6 (six) hours as needed for pain (pain).    Yes Historical Provider, MD  ibuprofen (ADVIL,MOTRIN) 200 MG tablet Take 400 mg by mouth every 6 (six) hours as needed for moderate pain (shoulder pain).   Yes Historical Provider, MD  levothyroxine (SYNTHROID, LEVOTHROID) 137 MCG tablet Take 137 mcg by mouth daily before breakfast.   Yes Historical Provider, MD  losartan (COZAAR) 50 MG tablet Take 1 tablet (50 mg total) by mouth 2 (two) times daily. 01/14/14  Yes Burnell Blanks, MD  metoprolol (LOPRESSOR) 50 MG tablet Take 75 mg by mouth 2 (two) times daily.   Yes Historical Provider, MD  Multiple Vitamin (MULTIVITAMIN) capsule Take 1 capsule by mouth daily.     Yes Historical Provider, MD  Multiple Vitamins-Minerals (EYE VITAMINS) CAPS Take 1 capsule by mouth daily.    Yes Historical Provider, MD  pioglitazone (ACTOS) 15 MG tablet Take 15 mg by mouth daily.   Yes Historical Provider, MD  potassium chloride (K-DUR) 10 MEQ tablet Take 10 mEq by mouth daily.   Yes Historical Provider, MD  rOPINIRole (REQUIP) 3 MG tablet Take 3 mg by mouth at bedtime.     Yes Historical Provider, MD  anastrozole (ARIMIDEX) 1 MG tablet TAKE 1 TABLET DAILY 03/20/14   Chauncey Cruel, MD  cetirizine (ZYRTEC) 10 MG tablet Take 10 mg by mouth daily as needed (allergies).     Historical Provider, MD  diazepam (VALIUM) 5 MG tablet Take 1 tablet (5 mg total) by mouth every 6 (six) hours as needed. 07/26/13   Burnell Blanks, MD  KLOR-CON M10 10 MEQ tablet TAKE 1 TABLET DAILY 06/17/14  Burnell Blanks, MD  metoprolol (LOPRESSOR) 50 MG tablet TAKE 1 AND 1/2 TABLETS     TWICE DAILY 07/04/14   Burnell Blanks, MD   BP 141/44 mmHg  Pulse 60  Temp(Src) 98.6 F (37 C) (Oral)  Resp 16  SpO2 99% Physical Exam  Constitutional: She is oriented to person, place, and time. She appears well-developed and well-nourished. No distress.  HENT:  Head: Normocephalic and atraumatic.  Mouth/Throat: Oropharynx is clear and moist. No oropharyngeal exudate.  Eyes: Conjunctivae and EOM are normal. Pupils are equal, round, and reactive to light. No scleral icterus.  Neck: Normal range of motion. Neck supple. No JVD present. No thyromegaly present.  Cardiovascular: Normal rate, regular rhythm, normal heart sounds and intact distal pulses.  Exam reveals no gallop and no friction rub.   No murmur heard. Pulmonary/Chest: Effort normal and breath sounds normal. No respiratory distress. She has no wheezes. She has no rales. She exhibits no tenderness.  Abdominal: Soft. Bowel sounds are normal. She exhibits no distension and no mass. There is no tenderness. There is no rebound and no guarding.  Musculoskeletal:       Back:  Patient rises slowly from sitting to standing.  They walk without an antalgic gait.  There is no evidence of erythema, ecchymosis, or gross deformity.  There is tenderness to palpation over the right scapula, and subscapula area.  There is minimal bony thoracic tenderness.  Active ROM is full.  There is pain with movement of the right arm.  There is full  ROM of the Shoulder.  Negative empty can.  Sensation to light touch is intact over all extremities.  Strength is symmetric and equal in all extremities.     Lymphadenopathy:    She has no cervical adenopathy.  Neurological: She is alert and oriented to person, place, and time. She has normal strength. No cranial nerve deficit or sensory deficit. Coordination normal.  Skin: Skin is warm and dry. She is not diaphoretic.  Psychiatric: She has a normal mood and affect. Her behavior is normal. Judgment and thought content normal.  Nursing note and vitals reviewed.   ED Course  Procedures (including critical care time) Labs Review Labs Reviewed  I-STAT CHEM 8, ED - Abnormal; Notable for the following:    Glucose, Bld 107 (*)    Calcium, Ion 1.11 (*)    All other components within normal limits  SEDIMENTATION RATE  Randolm Idol, ED    Imaging Review Dg Thoracic Spine 2 View  07/14/2014   CLINICAL DATA:  Pt states she tripped and fell 2 weeks ago at church. Pt states she landed on right shoulder blade/right side. Pt complaints of posterior right shoulder pain to scapula and right sided mid to upper back pain.  EXAM: THORACIC SPINE - 2 VIEW  COMPARISON:  Chest x-ray 07/11/2011  FINDINGS: Vertebral body alignment, heights and disc space heights are within normal. There is mild to moderate spondylosis throughout the thoracic spine. There is no compression fracture or subluxation. Pedicles are intact. Sternotomy wires are present. There is calcified plaque over the thoracic aorta.  IMPRESSION: No acute thoracic spine injury.   Electronically Signed   By: Marin Olp M.D.   On: 07/14/2014 18:21   Dg Scapula Right  07/14/2014   CLINICAL DATA:  Pt states she tripped and fell 2 weeks ago at church. Pt states she landed on right shoulder blade/right side. Pt complaints of posterior right shoulder pain to scapula and right sided  mid to upper back pain  EXAM: RIGHT SCAPULA - 2+ VIEWS  COMPARISON:   Chest x-ray 07/11/2011  FINDINGS: There are degenerative changes of the [facility] joint. There is no acute fracture or dislocation. Scapula is otherwise unremarkable. There are degenerative changes of the spine.  IMPRESSION: No acute fracture   Electronically Signed   By: Marin Olp M.D.   On: 07/14/2014 18:20     EKG Interpretation   Date/Time:  Monday July 14 2014 20:07:25 EST Ventricular Rate:  78 PR Interval:  155 QRS Duration: 136 QT Interval:  453 QTC Calculation: 516 R Axis:   -44 Text Interpretation:  Sinus rhythm Left bundle branch block since last  tracing no significant change Confirmed by Eulis Foster  MD, ELLIOTT ([id number]) on  07/14/2014 8:38:02 PM      MDM   Final diagnoses:  Back pain  Pleural pain   Patient is a 80 y.o. Female who presents to the ED with right sided subscapular pain.  Physical exam reveals some thoracic and scapular tenderness to palpation and plain film xrays of the scapula and the thoracic spine were obtained and were negative.  Sed rate was normal, istat troponin was negative.  EKG is without change from prior.  Istat chem 8 unremarkable.  CT angio of chest reveals no evidence of PE or aortic dissection.  Patient treated with valium and fentanyl with minimal relief of pain.  Will discharge the patient home with percocet and with valium.  Patient to return for chest pain,shortness of breath, or signs of cauda equina.  Patient to follow-up with her PCP.  Patient states understanding and agreement.  Patient seen by and examined by Dr. Eulis Foster who agrees with the above plan and workup.  Patient is stable for discharge.        Cherylann Parr, PA-C 07/14/14 [place], MD 07/14/14 [phone]

## 2014-07-14 NOTE — Discharge Instructions (Signed)
Muscle Cramps and Spasms Muscle cramps and spasms are when muscles tighten by themselves. They usually get better within minutes. Muscle cramps are painful. They are usually stronger and last longer than muscle spasms. Muscle spasms may or may not be painful. They can last a few seconds or much longer. HOME CARE  Drink enough fluid to keep your pee (urine) clear or pale yellow.  Massage, stretch, and relax the muscle.  Use a warm towel, heating pad, or warm shower water on tight muscles.  Place ice on the muscle if it is tender or in pain.  Put ice in a plastic bag.  Place a towel between your skin and the bag.  Leave the ice on for 15-20 minutes, 03-04 times a day.  Only take medicine as told by your doctor. GET HELP RIGHT AWAY IF:  Your cramps or spasms get worse, happen more often, or do not get better with time. MAKE SURE YOU:  Understand these instructions.  Will watch your condition.  Will get help right away if you are not doing well or get worse. Document Released: 08/04/2008 Document Revised: 12/17/2012 Document Reviewed: 08/08/2012 Exit[employer] Patient Information 2015 [employer]. This information is not intended to replace advice given to you by your health care provider. Make sure you discuss any questions you have with your health care provider.  

## 2014-07-28 ENCOUNTER — Ambulatory Visit (INDEPENDENT_AMBULATORY_CARE_PROVIDER_SITE_OTHER): Payer: Medicare Other | Admitting: Cardiovascular Disease

## 2014-07-28 ENCOUNTER — Encounter: Payer: Self-pay | Admitting: Cardiovascular Disease

## 2014-07-28 VITALS — BP 140/68 | HR 55 | Ht 62.0 in | Wt 189.0 lb

## 2014-07-28 DIAGNOSIS — E785 Hyperlipidemia, unspecified: Secondary | ICD-10-CM | POA: Diagnosis not present

## 2014-07-28 DIAGNOSIS — I447 Left bundle-branch block, unspecified: Secondary | ICD-10-CM | POA: Diagnosis not present

## 2014-07-28 DIAGNOSIS — I2510 Atherosclerotic heart disease of native coronary artery without angina pectoris: Secondary | ICD-10-CM | POA: Diagnosis not present

## 2014-07-28 DIAGNOSIS — I10 Essential (primary) hypertension: Secondary | ICD-10-CM | POA: Diagnosis not present

## 2014-07-28 DIAGNOSIS — I779 Disorder of arteries and arterioles, unspecified: Secondary | ICD-10-CM | POA: Diagnosis not present

## 2014-07-28 DIAGNOSIS — I739 Peripheral vascular disease, unspecified: Secondary | ICD-10-CM

## 2014-07-28 DIAGNOSIS — I25810 Atherosclerosis of coronary artery bypass graft(s) without angina pectoris: Secondary | ICD-10-CM

## 2014-07-28 NOTE — Progress Notes (Signed)
History of Present Illness: 80 yo female with history of CAD s/p CABG 2012, HTN, HLD, DM, carotid artery disease, hypothyroidism, breast cancer here today for cardiac follow up. She has been followed by Dr. Lia Foyer. She presented with unstable angina in October 2012 and was found to have severe triple vessel CAD by cath, followed by 2V CABG per Dr. Servando Snare (LIMA to LAD, SVG to RCA). She has done well since then. Breast cancer s/p lumpectomy and has completed radiation. She takes Arimidex. I met her in November 2014 and at our first visit, she c/o dyspnea. BP was elevated and Cozaar was added. Echo 07/29/13 with normal LVEF, normal PA pressures, no valve disease. Carotid dopplers 08/09/13 with mild bilateral carotid artery disease.   She is here today for follow up. No chest pains but she has had several episodes of jaw and neck pain on the left side. No dizziness, near syncope or syncope.  LE edema improved.    Primary Care Physician: Legrand Como Altheimer  Last Lipid Profile: Followed in primary care.    Past Medical History  Diagnosis Date  . Diverticulitis of colon with perforation   . S/P CABG x 2 06/2011    Dr. Servando Snare (L-LAD, [facility])  . Carotid stenosis     dopplers 53/64: LICA 68-03%  . DM2 (diabetes mellitus, type 2)   . HTN (hypertension)   . HLD (hyperlipidemia)   . Hypothyroidism   . RLS (restless legs syndrome)   . Breast cancer, left breast 11/01/2011  . CAD (coronary artery disease)     LHC 06/14/11: LAD 80% and 80% after Dx, Dx 80% (Dx was small), oOM 80%, mOM 70% (OM was small), oRCA 70-80%, mRCA 60-70%, EF 55% - referred for CABG;   Echo 06/14/11: mild focal basal septal hypertrophy, grade 1 diast dysfxn, mild LAE, mild RVE, PASP 37.    . Kidney stones   . GERD (gastroesophageal reflux disease)   . Psoriasis   . Diabetes mellitus     x 5-6 yrs  . Breast cancer 10/20/11    L breast, ER/PR+, HER2 -  . S/P radiation therapy 12/20/11 - 01/10/12    Left Breast: 2122 cGy/ 16  Fractions    Past Surgical History  Procedure Laterality Date  . Colon resection  1977  . Arthroscopy right knee    . Off-pump coronary artery bypass grafting x2  06/16/2011  . Vein bypass surgery  1999  . Colon surgery      part colectomy-  . Appendectomy    . Coronary artery bypass graft  10/12  . Breast biospy  11/04/11    Breast, Left, Needle Core Biopsy 6 Oclock, Benign Breast Tissue wtih Fibrocystic  Changes: Microcalcifications Identified, No Neoplasm or Malignancy Identified  . Breast biospy  10/20/11    Breast, Left, Needle Core Biopsy LOQ: Invsive ductal Carcinoman  . Breast lumpectomy  11/22/11    Breast, Lumpectomy, Left with Sentinel Node Biopsy: Invasive High Grade Ductal Carcinoma : No lymphovascular Invasion: High  Grade Ductal Carcinoma In-Situ with Calcification and Comedo Necrosis  . Abdominal hysterectomy      age 62's    Current Outpatient Prescriptions  Medication Sig Dispense Refill  . anastrozole (ARIMIDEX) 1 MG tablet TAKE 1 TABLET DAILY 90 tablet 2  . anastrozole (ARIMIDEX) 1 MG tablet Take 1 mg by mouth daily.    Marland Kitchen aspirin 81 MG tablet Take 81 mg by mouth daily.      Marland Kitchen CALCITRIOL PO Take  2 capsules by mouth daily.    Marland Kitchen CALCIUM PO Take 2 capsules by mouth at bedtime.    . Canagliflozin (INVOKANA) 300 MG TABS Take 1 tablet by mouth every morning.     . cetirizine (ZYRTEC) 10 MG tablet Take 10 mg by mouth daily as needed (allergies).     . Cholecalciferol (VITAMIN D-3) 5000 UNITS TABS Take 1 tablet by mouth daily.    . CRESTOR 20 MG tablet Take 20 mg by mouth daily.     . diazepam (VALIUM) 5 MG tablet Take 1 tablet (5 mg total) by mouth every 6 (six) hours as needed. 30 tablet 0  . diazepam (VALIUM) 5 MG tablet Take 1 tablet (5 mg total) by mouth 2 (two) times daily. 20 tablet 0  . esomeprazole (NEXIUM) 40 MG capsule Take 40 mg by mouth daily before breakfast.      . ezetimibe (ZETIA) 10 MG tablet Take 10 mg by mouth daily.      . furosemide (LASIX) 40 MG  tablet Take 1 tablet (40 mg total) by mouth daily. 90 tablet 3  . HYDROcodone-acetaminophen (VICODIN) 5-500 MG per tablet Take 1 tablet by mouth every 6 (six) hours as needed for pain (pain).     Marland Kitchen ibuprofen (ADVIL,MOTRIN) 200 MG tablet Take 400 mg by mouth every 6 (six) hours as needed for moderate pain (shoulder pain).    Marland Kitchen KLOR-CON M10 10 MEQ tablet TAKE 1 TABLET DAILY 90 tablet 1  . levothyroxine (SYNTHROID, LEVOTHROID) 137 MCG tablet Take 137 mcg by mouth daily before breakfast.    . losartan (COZAAR) 50 MG tablet Take 1 tablet (50 mg total) by mouth 2 (two) times daily. 180 tablet 3  . metoprolol (LOPRESSOR) 50 MG tablet TAKE 1 AND 1/2 TABLETS     TWICE DAILY 270 tablet 1  . metoprolol (LOPRESSOR) 50 MG tablet Take 75 mg by mouth 2 (two) times daily.    . Multiple Vitamin (MULTIVITAMIN) capsule Take 1 capsule by mouth daily.      . Multiple Vitamins-Minerals (EYE VITAMINS) CAPS Take 1 capsule by mouth daily.     Marland Kitchen oxyCODONE-acetaminophen (PERCOCET) 5-325 MG per tablet Take 1-2 tablets by mouth every 6 (six) hours as needed. 10 tablet 0  . pioglitazone (ACTOS) 15 MG tablet Take 15 mg by mouth daily.    . potassium chloride (K-DUR) 10 MEQ tablet Take 10 mEq by mouth daily.    Marland Kitchen rOPINIRole (REQUIP) 3 MG tablet Take 3 mg by mouth at bedtime.      No current facility-administered medications for this visit.    No Known Allergies  History   Social History  . Marital Status: Married    Spouse Name: N/A    Number of Children: 1  . Years of Education: N/A   Occupational History  . RETIRED-worked for an accounting firm    Social History Main Topics  . Smoking status: Former Smoker -- 1.00 packs/day for 6 years    Types: Cigarettes    Quit date: 09/05/1980  . Smokeless tobacco: Not on file  . Alcohol Use: Yes     Comment: socially  . Drug Use: No  . Sexual Activity: Not on file     Comment: menarche 57, p2, 1 stillbirth, 1st preg age 64, HRT x many yrs   Other Topics Concern  .  Not on file   Social History Narrative    Family History  Problem Relation Age of Onset  . Cancer Mother  throat  . Hypertension Brother   . Cancer Paternal Uncle     Review of Systems:  As stated in the HPI and otherwise negative.   BP 140/68 mmHg  Pulse 55  Ht 5' 2"  (1.575 m)  Wt 189 lb (85.73 kg)  BMI 34.56 kg/m2  Physical Examination: General: Well developed, well nourished, NAD HEENT: OP clear, mucus membranes moist SKIN: warm, dry. No rashes. Neuro: No focal deficits Musculoskeletal: Muscle strength 5/5 all ext Psychiatric: Mood and affect normal Neck: No JVD, no carotid bruits, no thyromegaly, no lymphadenopathy. Lungs:Clear bilaterally, no wheezes, rhonci, crackles Cardiovascular: Regular rate and rhythm. No murmurs, gallops or rubs. Abdomen:Soft. Bowel sounds present. Non-tender.  Extremities: Trace to 1+ bilateral lower extremity edema. Pulses are 2 + in the bilateral DP/PT.  Cardiac cath 06/12/11: 2. The left main is free of critical disease.  3. The LAD has a bifurcation stenosis. There is about 80% involvement  of the LAD and then 80% involvement of diagonal. There is 80%  lesion in the LAD just beyond the diagonal. The distal LAD is  suitable for grafting.  4. The circumflex provides a first marginal that has probably 80%  ostial and then a 70% mid lesion. The remaining portion of the  circumflex is fairly large and is without critical narrowing other  than minor luminal irregularity.  5. The right coronary artery has clear-cut 70-80% ostial stenosis.  There is also a 60-70% mid vessel stenosis. The PDA and PLA are  suitable for grafting.  6. Ventriculography in the RAO projection reveals well-preserved left  ventricular function with a normal ejection fraction 55% by  estimate. I did not appreciate significant regurgitation.  Echo 07/29/13: Left ventricle: The cavity size was normal. Wall thickness was increased in a pattern of mild LVH. Systolic  function was normal. The estimated ejection fraction was in the range of 50% to 55%. Doppler parameters are consistent with abnormal left ventricular relaxation (grade 1 diastolic dysfunction). - Pulmonary arteries: PA peak pressure: 31m Hg (S).  EKG: Sinus brady, rate 55 bpm. LBBB  Assessment and Plan:   1. CAD:  No ischemic testing since her bypass surgery in 2012. NOw with left sided neck and jaw pain. Will arrange Lexiscan stress myoview to exclude ischemia.  Will continue medical management with ASA, statin and beta blocker. Echo November 2014 with normal LV function  2. HTN: BP is controlled. Will continue current meds.   3. Hyperlipidemia: Continue statin. Well controlled. Labs are updated in primary care per pt  4. LBBB: Chronic  5. Carotid artery disease: Mild by dopplers December 2014.  Will repeat dopplers now. If no change, will need every 2 years.   6. Chronic diastolic CHF: Stable. Continue Lasix  40 mg daily.

## 2014-07-28 NOTE — Patient Instructions (Signed)
Your physician wants you to follow-up in:  6 months.  You will receive a reminder letter in the mail two months in advance. If you don't receive a letter, please call our office to schedule the follow-up appointment.  Your physician has requested that you have a lexiscan myoview. For further information please visit [email or web address]. Please follow instruction sheet, as given.   Your physician has requested that you have a carotid duplex. This test is an ultrasound of the carotid arteries in your neck. It looks at blood flow through these arteries that supply the brain with blood. Allow one hour for this exam. There are no restrictions or special instructions. To be done after August 09, 2014

## 2014-08-11 ENCOUNTER — Encounter (HOSPITAL_BASED_OUTPATIENT_CLINIC_OR_DEPARTMENT_OTHER): Payer: Medicare Other | Admitting: Radiology

## 2014-08-11 ENCOUNTER — Ambulatory Visit (HOSPITAL_COMMUNITY): Payer: Medicare Other | Attending: Cardiovascular Disease | Admitting: Cardiology

## 2014-08-11 DIAGNOSIS — I6523 Occlusion and stenosis of bilateral carotid arteries: Secondary | ICD-10-CM | POA: Insufficient documentation

## 2014-08-11 DIAGNOSIS — I25810 Atherosclerosis of coronary artery bypass graft(s) without angina pectoris: Secondary | ICD-10-CM | POA: Diagnosis not present

## 2014-08-11 DIAGNOSIS — I779 Disorder of arteries and arterioles, unspecified: Secondary | ICD-10-CM | POA: Diagnosis not present

## 2014-08-11 DIAGNOSIS — I447 Left bundle-branch block, unspecified: Secondary | ICD-10-CM | POA: Diagnosis not present

## 2014-08-11 DIAGNOSIS — I739 Peripheral vascular disease, unspecified: Secondary | ICD-10-CM

## 2014-08-11 MED ORDER — ADENOSINE (DIAGNOSTIC) 3 MG/ML IV SOLN
0.5600 mg/kg | Freq: Once | INTRAVENOUS | Status: AC
  Administered 2014-08-11: 46.8 mg via INTRAVENOUS

## 2014-08-11 MED ORDER — TECHNETIUM TC 99M SESTAMIBI GENERIC - CARDIOLITE
30.0000 | Freq: Once | INTRAVENOUS | Status: AC | PRN
  Administered 2014-08-11: 30 via INTRAVENOUS

## 2014-08-11 MED ORDER — TECHNETIUM TC 99M SESTAMIBI GENERIC - CARDIOLITE
10.0000 | Freq: Once | INTRAVENOUS | Status: AC | PRN
  Administered 2014-08-11: 10 via INTRAVENOUS

## 2014-08-11 NOTE — Progress Notes (Signed)
Carotid duplex performed 

## 2014-08-11 NOTE — Progress Notes (Signed)
[facility] [address] [place], [place] [address] [phone]    Cardiology Nuclear Med Study  [name] is a 80 y.o. female     MRN : [id number]     DOB: [birth year]  Procedure Date: 08/11/2014  Nuclear Med Background Indication for Stress Test:  Evaluation for Ischemia and Graft Patency History:  CAD, CABG 2012 Cardiac Risk Factors: Carotid Disease, Hypertension, LBBB and NIDDM  Symptoms:  SOB and Jaw and left neck pain   Nuclear Pre-Procedure Caffeine/Decaff Intake:  None> 12 hrs NPO After: 7:30pm   Lungs:  clear O2 Sat: 97% on room air. IV 0.9% NS with Angio Cath:  22g  IV Site: R Wrist x 1, tolerated well IV Started by:  Irven Baltimore, RN  Chest Size (in):  40 Cup Size: C  Height: 5\' 2"  (1.575 m)  Weight:  184 lb (83.462 kg)  BMI:  Body mass index is 33.65 kg/(m^2). Tech Comments:  No medications (Diabetic po or insulin) this am. Irven Baltimore, RN.    Nuclear Med Study 1 or 2 day study: 1 day  Stress Test Type:  Adenosine  Reading MD: N/A  Order Authorizing Provider:  Lauree Chandler, MD  Resting Radionuclide: Technetium 67m Sestamibi  Resting Radionuclide Dose: 11.0 mCi   Stress Radionuclide:  Technetium 63m Sestamibi  Stress Radionuclide Dose: 33.0 mCi           Stress Protocol Rest HR: 55 Stress HR: 73  Rest BP: 140/61 Stress BP: 149/49  Exercise Time (min): n/a METS: n/a   Predicted Max HR: 141 bpm % Max HR: 51.77 bpm Rate Pressure Product: 10877   Dose of Adenosine (mg):  46.8 mg Dose of Lexiscan: n/a mg  Dose of Atropine (mg): n/a Dose of Dobutamine: n/a mcg/kg/min (at max HR)  Stress Test Technologist: Glade Lloyd, BS-ES  Nuclear Technologist:  Earl Many, CNMT     Rest Procedure:  Myocardial perfusion imaging was performed at rest 45 minutes following the intravenous administration of Technetium 78m Sestamibi. Rest ECG: NSR-LBBB  Stress Procedure:  The patient received IV adenosine at 140 mcg/kg/min  for 4 minutes.  Technetium 61m Sestamibi was injected at the 2 minute mark and quantitative spect images were obtained after a 45 minute delay.  During the infusion of Adenosine, the patient complained of chest, jaw and neck tightness as well as SOB.  These symptoms resolved in recovery.  Stress ECG: No significant change from baseline ECG  QPS Raw Data Images:  Normal; no motion artifact; normal heart/lung ratio. Stress Images:  Normal homogeneous uptake in all areas of the myocardium. Rest Images:  Normal homogeneous uptake in all areas of the myocardium. Subtraction (SDS):  No evidence of ischemia. Transient Ischemic Dilatation (Normal <1.22):  0.93 Lung/Heart Ratio (Normal <0.45):  0.30  Quantitative Gated Spect Images QGS EDV:  86 ml QGS ESV:  16 ml  Impression Exercise Capacity:  Adenosine study with no exercise. BP Response:  Normal blood pressure response. Clinical Symptoms:  Mild chest pain/dyspnea. ECG Impression:  Baseline:  LBBB.  EKG uninterpretable due to LBBB at rest and stress. Comparison with Prior Nuclear Study: No previous nuclear study performed  Overall Impression:  Low risk stress nuclear study.   No evidence of ischemia by perfusion. LBBB present at rest and with stress. Vigorous LV systolic function.  LV Ejection Fraction: 80%.  LV Wall Motion:  NL LV Function; NL Wall Motion  Darlin Coco  MD

## 2014-09-10 DIAGNOSIS — M25511 Pain in right shoulder: Secondary | ICD-10-CM | POA: Diagnosis not present

## 2014-09-18 DIAGNOSIS — M25511 Pain in right shoulder: Secondary | ICD-10-CM | POA: Diagnosis not present

## 2014-09-22 DIAGNOSIS — E782 Mixed hyperlipidemia: Secondary | ICD-10-CM | POA: Diagnosis not present

## 2014-09-22 DIAGNOSIS — E038 Other specified hypothyroidism: Secondary | ICD-10-CM | POA: Diagnosis not present

## 2014-09-22 DIAGNOSIS — E119 Type 2 diabetes mellitus without complications: Secondary | ICD-10-CM | POA: Diagnosis not present

## 2014-09-24 DIAGNOSIS — S46011D Strain of muscle(s) and tendon(s) of the rotator cuff of right shoulder, subsequent encounter: Secondary | ICD-10-CM | POA: Diagnosis not present

## 2014-09-25 ENCOUNTER — Telehealth: Payer: Self-pay | Admitting: Cardiovascular Disease

## 2014-09-25 DIAGNOSIS — K219 Gastro-esophageal reflux disease without esophagitis: Secondary | ICD-10-CM | POA: Diagnosis not present

## 2014-09-25 DIAGNOSIS — I2510 Atherosclerotic heart disease of native coronary artery without angina pectoris: Secondary | ICD-10-CM | POA: Diagnosis not present

## 2014-09-25 DIAGNOSIS — E119 Type 2 diabetes mellitus without complications: Secondary | ICD-10-CM | POA: Diagnosis not present

## 2014-09-25 DIAGNOSIS — I10 Essential (primary) hypertension: Secondary | ICD-10-CM | POA: Diagnosis not present

## 2014-09-25 DIAGNOSIS — E782 Mixed hyperlipidemia: Secondary | ICD-10-CM | POA: Diagnosis not present

## 2014-09-25 DIAGNOSIS — M810 Age-related osteoporosis without current pathological fracture: Secondary | ICD-10-CM | POA: Diagnosis not present

## 2014-09-25 DIAGNOSIS — E038 Other specified hypothyroidism: Secondary | ICD-10-CM | POA: Diagnosis not present

## 2014-09-25 NOTE — Telephone Encounter (Signed)
Request for surgical clearance:  1. What type of surgery is being performed? Rotator Cuff Tear   2. When is this surgery scheduled? Not scheduled yet, pending sx clearance   3. Are there any medications that need to be held prior to surgery and how long? No  4. Name of physician performing surgery? Dr. Justice Britain at [facility]  5. What is your office phone and fax number? Contact Abigail Butts at [phone]   Pt calling stating that Dr. Susie Cassette office faxed over sx clearance form and is trying to get it expedited so pt's sx can be scheduled. Please call back and advise.

## 2014-09-25 NOTE — Telephone Encounter (Signed)
Spoke with pt and informed her that we did receive the papers from [facility]. Informed pt that Fraser Din and Dr. Angelena Form are both out of the office today but would return tomorrow. Informed pt that I would route this message to [facility] and Dr. Angelena Form to review, advise and follow up. Pt was appreciative of the call and was agreement with this plan.

## 2014-09-29 ENCOUNTER — Telehealth: Payer: Self-pay | Admitting: Cardiovascular Disease

## 2014-09-29 NOTE — Telephone Encounter (Signed)
Spoke with pt who reports from a cardiac standpoint she is feeling fine. I told her Dr. Angelena Form would complete paperwork clearing her for surgery and we would send to [employer].

## 2014-09-29 NOTE — Telephone Encounter (Signed)
Received request from Nurse fax box, documents faxed for surgical clearance. To: [employer] Fax number: [phone] Attention: 1.25.16/km

## 2014-09-29 NOTE — Telephone Encounter (Signed)
[name], I saw her in November 2015 and she had a normal stress test December 2015. Can we check with her to make sure she is doing well and if so, I can write a letter for surgical clearance. I do not see any clearance forms in my folder. Gerald Stabs

## 2014-09-29 NOTE — Telephone Encounter (Signed)
Form completed. cdm

## 2014-10-07 NOTE — Pre-Procedure Instructions (Signed)
[name]  10/07/2014   Your procedure is scheduled on:  Thursday October 16, 2014 at 10:00 AM.  Report to [facility] at 8:00 AM.  Call this number if you have problems the morning of surgery: [phone]  For any other questions Monday-Friday from 8am-4pm call: [phone]  Remember:   Do not eat food or drink liquids after midnight.   Take these medicines the morning of surgery with A SIP OF WATER: Anastrozole (Arimidex), Diazepam (Valium), Esomeprazole (Nexium), Levothyroxine (Synthroid), Metoprolol (Lopressor)   Do NOT take any diabetic medications the morning of your surgery   Please stop taking any aspirin, Ibuprofen, Advil, Motrin, Vitamins, etc on Thursday February 4th   Do not wear jewelry, make-up or nail polish.  Do not wear lotions, powders, or perfumes.   Do not shave 48 hours prior to surgery.   Do not bring valuables to the hospital.  [facility] is not responsible for any belongings or valuables.               Contacts, dentures or bridgework may not be worn into surgery.  Leave suitcase in the car. After surgery it may be brought to your room.  For patients admitted to the hospital, discharge time is determined by your treatment team.               Patients discharged the day of surgery will not be allowed to drive home.  Name and phone number of your driver:   Special Instructions: Shower using CHG soap the night before and the morning of your surgery   Please read over the following fact sheets that you were given: Pain Booklet, Coughing and Deep Breathing and Surgical Site Infection Prevention

## 2014-10-08 ENCOUNTER — Encounter (HOSPITAL_COMMUNITY): Payer: Self-pay

## 2014-10-08 ENCOUNTER — Encounter (HOSPITAL_COMMUNITY)
Admission: RE | Admit: 2014-10-08 | Discharge: 2014-10-08 | Disposition: A | Discharge status: Home / Self Care | Payer: Medicare Other | Source: Ambulatory Visit | Attending: Orthopedic Surgery | Admitting: Orthopedic Surgery

## 2014-10-08 DIAGNOSIS — E119 Type 2 diabetes mellitus without complications: Secondary | ICD-10-CM | POA: Insufficient documentation

## 2014-10-08 DIAGNOSIS — Z951 Presence of aortocoronary bypass graft: Secondary | ICD-10-CM | POA: Diagnosis not present

## 2014-10-08 DIAGNOSIS — C50919 Malignant neoplasm of unspecified site of unspecified female breast: Secondary | ICD-10-CM | POA: Diagnosis not present

## 2014-10-08 DIAGNOSIS — R001 Bradycardia, unspecified: Secondary | ICD-10-CM | POA: Diagnosis not present

## 2014-10-08 DIAGNOSIS — M1990 Unspecified osteoarthritis, unspecified site: Secondary | ICD-10-CM | POA: Insufficient documentation

## 2014-10-08 DIAGNOSIS — I2510 Atherosclerotic heart disease of native coronary artery without angina pectoris: Secondary | ICD-10-CM | POA: Insufficient documentation

## 2014-10-08 DIAGNOSIS — I6523 Occlusion and stenosis of bilateral carotid arteries: Secondary | ICD-10-CM | POA: Insufficient documentation

## 2014-10-08 DIAGNOSIS — E039 Hypothyroidism, unspecified: Secondary | ICD-10-CM | POA: Diagnosis not present

## 2014-10-08 DIAGNOSIS — Z87891 Personal history of nicotine dependence: Secondary | ICD-10-CM | POA: Diagnosis not present

## 2014-10-08 DIAGNOSIS — Z01812 Encounter for preprocedural laboratory examination: Secondary | ICD-10-CM | POA: Diagnosis not present

## 2014-10-08 DIAGNOSIS — Z79811 Long term (current) use of aromatase inhibitors: Secondary | ICD-10-CM | POA: Insufficient documentation

## 2014-10-08 DIAGNOSIS — Z79899 Other long term (current) drug therapy: Secondary | ICD-10-CM | POA: Insufficient documentation

## 2014-10-08 DIAGNOSIS — I10 Essential (primary) hypertension: Secondary | ICD-10-CM | POA: Insufficient documentation

## 2014-10-08 DIAGNOSIS — Z7982 Long term (current) use of aspirin: Secondary | ICD-10-CM | POA: Insufficient documentation

## 2014-10-08 DIAGNOSIS — M75101 Unspecified rotator cuff tear or rupture of right shoulder, not specified as traumatic: Secondary | ICD-10-CM | POA: Diagnosis not present

## 2014-10-08 DIAGNOSIS — E785 Hyperlipidemia, unspecified: Secondary | ICD-10-CM | POA: Insufficient documentation

## 2014-10-08 HISTORY — DX: Bronchitis, not specified as acute or chronic: J40

## 2014-10-08 HISTORY — DX: Personal history of urinary calculi: Z87.442

## 2014-10-08 HISTORY — DX: Unspecified osteoarthritis, unspecified site: M19.90

## 2014-10-08 LAB — COMPREHENSIVE METABOLIC PANEL
ALT: 17 U/L (ref 0–35)
ANION GAP: 7 (ref 5–15)
AST: 22 U/L (ref 0–37)
Albumin: 4.2 g/dL (ref 3.5–5.2)
Alkaline Phosphatase: 72 U/L (ref 39–117)
BUN: 19 mg/dL (ref 6–23)
CO2: 27 mmol/L (ref 19–32)
CREATININE: 0.87 mg/dL (ref 0.50–1.10)
Calcium: 9.8 mg/dL (ref 8.4–10.5)
Chloride: 103 mmol/L (ref 96–112)
GFR calc Af Amer: 72 mL/min — ABNORMAL LOW (ref >90)
GFR calc non Af Amer: 62 mL/min — ABNORMAL LOW (ref >90)
Glucose, Bld: 84 mg/dL (ref 70–99)
POTASSIUM: 4.1 mmol/L (ref 3.5–5.1)
Sodium: 137 mmol/L (ref 135–145)
Total Bilirubin: 0.9 mg/dL (ref 0.3–1.2)
Total Protein: 7.1 g/dL (ref 6.0–8.3)

## 2014-10-08 LAB — CBC WITH DIFFERENTIAL/PLATELET
Basophils Absolute: 0 10*3/uL (ref 0.0–0.1)
Basophils Relative: 1 % (ref 0–1)
Eosinophils Absolute: 0.1 10*3/uL (ref 0.0–0.7)
Eosinophils Relative: 2 % (ref 0–5)
HCT: 42.4 % (ref 36.0–46.0)
Hemoglobin: 14.1 g/dL (ref 12.0–15.0)
Lymphocytes Relative: 29 % (ref 12–46)
Lymphs Abs: 2.3 10*3/uL (ref 0.7–4.0)
MCH: 29.1 pg (ref 26.0–34.0)
MCHC: 33.3 g/dL (ref 30.0–36.0)
MCV: 87.4 fL (ref 78.0–100.0)
MONO ABS: 0.5 10*3/uL (ref 0.1–1.0)
Monocytes Relative: 6 % (ref 3–12)
NEUTROS ABS: 5.1 10*3/uL (ref 1.7–7.7)
Neutrophils Relative %: 62 % (ref 43–77)
Platelets: 248 10*3/uL (ref 150–400)
RBC: 4.85 MIL/uL (ref 3.87–5.11)
RDW: 14.2 % (ref 11.5–15.5)
WBC: 8 10*3/uL (ref 4.0–10.5)

## 2014-10-08 LAB — APTT: aPTT: 35 seconds (ref 24–37)

## 2014-10-08 LAB — PROTIME-INR
INR: 1.02 (ref 0.00–1.49)
PROTHROMBIN TIME: 13.5 s (ref 11.6–15.2)

## 2014-10-08 LAB — GLUCOSE, CAPILLARY: GLUCOSE-CAPILLARY: 87 mg/dL (ref 70–99)

## 2014-10-08 NOTE — Progress Notes (Signed)
PCP is [facility] and Cardiologist is [employer]. Patient denied having acute cardiac or pulmonary issues.  Clearance note from Dr. Angelena Form on chart. Will send chart to [place], [place] for review.

## 2014-10-09 LAB — HEMOGLOBIN A1C
HEMOGLOBIN A1C: 6.7 % — AB (ref 4.8–5.6)
Mean Plasma Glucose: 146 mg/dL

## 2014-10-09 NOTE — Progress Notes (Signed)
Anesthesia Chart Review:  Pt is 81 year old female scheduled for R shoulder arthroscopy with subacromial decompression, distal clavicle resection and rotator cuff repair on 10/16/2014 with Dr. Onnie Graham.   PCP is Dr. Elyse Hsu and cardiologist is Dr. Angelena Form.   PMH includes: CAD, s.p CABG x2 (LIMA to LAD, SVG to RCA) (2012), HTN, carotid stenosis, DM, hyperlipidemia, hypothyroidism, breast cancer. Former smoker. BMI 33  Medications include: arimidex, ASA, lasix, losartan, metoprolol  Preoperative labs reviewed.    EKG 07/28/2014: Sinus bradycardia (55 bpm), LBBB.   Nuclear stress test 08/11/2014: Overall Impression: Low risk stress nuclear study. No evidence of ischemia by perfusion. LBBB present at rest and with stress. Vigorous LV systolic function. LV Ejection Fraction: 80%. LV Wall Motion: NL LV Function; NL Wall Motion  Carotid dopplers 08/13/2014: - Stable carotid artery disease.  - 1-39% RICA stenosis - 41-93% LICA stenosis - Patent vertebral arteries with antegrade flow - Normal subclavian arteries, bilaterally.   Echo 07/29/2013:  - Left ventricle: The cavity size was normal. Wall thickness was increased in a pattern of mild LVH. Systolic function was normal. The estimated ejection fraction was in the range of 50% to 55%. Doppler parameters are consistent with abnormal left ventricular relaxation (grade 1 diastolic dysfunction). - Pulmonary arteries: PA peak pressure: 60mm Hg (S).  Cardiac cath 06/12/2011: -The left main is free of critical disease. -The LAD has a bifurcation stenosis. There is about 80% involvement of the LAD and then 80% involvement of diagonal. There is 80% lesion in the LAD just beyond the diagonal. The distal LAD is suitable for grafting. -The circumflex provides a first marginal that has probably 80% ostial and then a 70% mid lesion. The remaining portion of the circumflex is fairly large and is without critical narrowing other than minor luminal  irregularity. -The right coronary artery has clear-cut 70-80% ostial stenosis. There is also a 60-70% mid vessel stenosis. The PDA and PLA are suitable for grafting. -Ventriculography in the RAO projection reveals well-preserved left ventricular function with a normal ejection fraction 55% by estimate. I did not appreciate significant regurgitation.  Pt has cardiac clearance from Dr. Angelena Form for surgery.   If no changes, I anticipate pt can proceed with surgery as scheduled.   Willeen Cass, FNP-BC [facility]/Anesthesiology Phone: [phone] 10/09/2014 1:46 PM

## 2014-10-15 MED ORDER — LACTATED RINGERS IV SOLN: INTRAVENOUS | Status: DC

## 2014-10-15 MED ORDER — CEFAZOLIN SODIUM-DEXTROSE 2-3 GM-% IV SOLR
2.0000 g | INTRAVENOUS | Status: AC
  Administered 2014-10-16: 2 g via INTRAVENOUS
  Filled 2014-10-15: qty 50

## 2014-10-16 ENCOUNTER — Ambulatory Visit (HOSPITAL_COMMUNITY)
Admission: RE | Admit: 2014-10-16 | Discharge: 2014-10-16 | Disposition: A | Discharge status: Home / Self Care | Payer: Medicare Other | Source: Ambulatory Visit | Attending: Orthopedic Surgery | Admitting: Orthopedic Surgery

## 2014-10-16 ENCOUNTER — Ambulatory Visit (HOSPITAL_COMMUNITY): Payer: Medicare Other | Admitting: Emergency Medicine

## 2014-10-16 ENCOUNTER — Encounter (HOSPITAL_COMMUNITY): Payer: Self-pay | Admitting: *Deleted

## 2014-10-16 ENCOUNTER — Ambulatory Visit (HOSPITAL_COMMUNITY): Payer: Medicare Other | Admitting: Anesthesiology

## 2014-10-16 ENCOUNTER — Encounter (HOSPITAL_COMMUNITY)
Admission: RE | Disposition: A | Discharge status: Home / Self Care | Payer: Self-pay | Source: Ambulatory Visit | Attending: Orthopedic Surgery

## 2014-10-16 DIAGNOSIS — E039 Hypothyroidism, unspecified: Secondary | ICD-10-CM | POA: Diagnosis not present

## 2014-10-16 DIAGNOSIS — E119 Type 2 diabetes mellitus without complications: Secondary | ICD-10-CM | POA: Insufficient documentation

## 2014-10-16 DIAGNOSIS — K219 Gastro-esophageal reflux disease without esophagitis: Secondary | ICD-10-CM | POA: Insufficient documentation

## 2014-10-16 DIAGNOSIS — M19011 Primary osteoarthritis, right shoulder: Secondary | ICD-10-CM | POA: Insufficient documentation

## 2014-10-16 DIAGNOSIS — E785 Hyperlipidemia, unspecified: Secondary | ICD-10-CM | POA: Insufficient documentation

## 2014-10-16 DIAGNOSIS — I10 Essential (primary) hypertension: Secondary | ICD-10-CM | POA: Diagnosis not present

## 2014-10-16 DIAGNOSIS — G2581 Restless legs syndrome: Secondary | ICD-10-CM | POA: Diagnosis not present

## 2014-10-16 DIAGNOSIS — M1990 Unspecified osteoarthritis, unspecified site: Secondary | ICD-10-CM | POA: Diagnosis not present

## 2014-10-16 DIAGNOSIS — M7540 Impingement syndrome of unspecified shoulder: Secondary | ICD-10-CM | POA: Diagnosis not present

## 2014-10-16 DIAGNOSIS — M75101 Unspecified rotator cuff tear or rupture of right shoulder, not specified as traumatic: Secondary | ICD-10-CM | POA: Insufficient documentation

## 2014-10-16 DIAGNOSIS — S46111A Strain of muscle, fascia and tendon of long head of biceps, right arm, initial encounter: Secondary | ICD-10-CM | POA: Diagnosis not present

## 2014-10-16 DIAGNOSIS — S46011A Strain of muscle(s) and tendon(s) of the rotator cuff of right shoulder, initial encounter: Secondary | ICD-10-CM | POA: Diagnosis not present

## 2014-10-16 DIAGNOSIS — Z951 Presence of aortocoronary bypass graft: Secondary | ICD-10-CM | POA: Insufficient documentation

## 2014-10-16 DIAGNOSIS — Z79899 Other long term (current) drug therapy: Secondary | ICD-10-CM | POA: Insufficient documentation

## 2014-10-16 DIAGNOSIS — Z87891 Personal history of nicotine dependence: Secondary | ICD-10-CM | POA: Insufficient documentation

## 2014-10-16 DIAGNOSIS — I2510 Atherosclerotic heart disease of native coronary artery without angina pectoris: Secondary | ICD-10-CM | POA: Insufficient documentation

## 2014-10-16 DIAGNOSIS — M7541 Impingement syndrome of right shoulder: Secondary | ICD-10-CM | POA: Diagnosis not present

## 2014-10-16 DIAGNOSIS — I739 Peripheral vascular disease, unspecified: Secondary | ICD-10-CM | POA: Insufficient documentation

## 2014-10-16 DIAGNOSIS — M75121 Complete rotator cuff tear or rupture of right shoulder, not specified as traumatic: Secondary | ICD-10-CM | POA: Diagnosis not present

## 2014-10-16 DIAGNOSIS — G8918 Other acute postprocedural pain: Secondary | ICD-10-CM | POA: Diagnosis not present

## 2014-10-16 DIAGNOSIS — Z7982 Long term (current) use of aspirin: Secondary | ICD-10-CM | POA: Insufficient documentation

## 2014-10-16 HISTORY — PX: SHOULDER ARTHROSCOPY WITH SUBACROMIAL DECOMPRESSION, ROTATOR CUFF REPAIR AND BICEP TENDON REPAIR: SHX5687

## 2014-10-16 LAB — GLUCOSE, CAPILLARY
Glucose-Capillary: 105 mg/dL — ABNORMAL HIGH (ref 70–99)
Glucose-Capillary: 76 mg/dL (ref 70–99)

## 2014-10-16 SURGERY — SHOULDER ARTHROSCOPY WITH SUBACROMIAL DECOMPRESSION, ROTATOR CUFF REPAIR AND BICEP TENDON REPAIR
Anesthesia: Regional | Site: Shoulder | Laterality: Right

## 2014-10-16 MED ORDER — DIAZEPAM 5 MG PO TABS: 2.5000 mg | ORAL_TABLET | Freq: Four times a day (QID) | ORAL | Status: AC | PRN

## 2014-10-16 MED ORDER — FENTANYL CITRATE 0.05 MG/ML IJ SOLN
INTRAMUSCULAR | Status: DC | PRN
  Administered 2014-10-16: 125 ug via INTRAVENOUS

## 2014-10-16 MED ORDER — EPHEDRINE SULFATE 50 MG/ML IJ SOLN
INTRAMUSCULAR | Status: DC | PRN
  Administered 2014-10-16 (×2): 10 mg via INTRAVENOUS

## 2014-10-16 MED ORDER — CHLORHEXIDINE GLUCONATE 4 % EX LIQD
60.0000 mL | Freq: Once | CUTANEOUS | Status: DC
  Filled 2014-10-16: qty 60

## 2014-10-16 MED ORDER — ONDANSETRON HCL 4 MG/2ML IJ SOLN
INTRAMUSCULAR | Status: DC | PRN
  Administered 2014-10-16: 4 mg via INTRAVENOUS

## 2014-10-16 MED ORDER — ONDANSETRON HCL 4 MG/2ML IJ SOLN
INTRAMUSCULAR | Status: AC
  Filled 2014-10-16: qty 4

## 2014-10-16 MED ORDER — NEOSTIGMINE METHYLSULFATE 10 MG/10ML IV SOLN
INTRAVENOUS | Status: DC | PRN
  Administered 2014-10-16: 2 mg via INTRAVENOUS

## 2014-10-16 MED ORDER — FENTANYL CITRATE 0.05 MG/ML IJ SOLN
50.0000 ug | Freq: Once | INTRAMUSCULAR | Status: AC
  Administered 2014-10-16: 50 ug via INTRAVENOUS

## 2014-10-16 MED ORDER — GLYCOPYRROLATE 0.2 MG/ML IJ SOLN
INTRAMUSCULAR | Status: DC | PRN
  Administered 2014-10-16: 0.2 mg via INTRAVENOUS

## 2014-10-16 MED ORDER — FENTANYL CITRATE 0.05 MG/ML IJ SOLN
INTRAMUSCULAR | Status: AC
  Filled 2014-10-16: qty 2

## 2014-10-16 MED ORDER — PROPOFOL 10 MG/ML IV BOLUS
INTRAVENOUS | Status: DC | PRN
  Administered 2014-10-16: 130 mg via INTRAVENOUS

## 2014-10-16 MED ORDER — ROCURONIUM BROMIDE 50 MG/5ML IV SOLN
INTRAVENOUS | Status: AC
  Filled 2014-10-16: qty 1

## 2014-10-16 MED ORDER — SODIUM CHLORIDE 0.9 % IR SOLN
Status: DC | PRN
  Administered 2014-10-16 (×5): 3000 mL

## 2014-10-16 MED ORDER — FENTANYL CITRATE 0.05 MG/ML IJ SOLN
INTRAMUSCULAR | Status: AC
  Filled 2014-10-16: qty 5

## 2014-10-16 MED ORDER — ROCURONIUM BROMIDE 100 MG/10ML IV SOLN
INTRAVENOUS | Status: DC | PRN
  Administered 2014-10-16: 15 mg via INTRAVENOUS

## 2014-10-16 MED ORDER — LIDOCAINE HCL (CARDIAC) 20 MG/ML IV SOLN
INTRAVENOUS | Status: DC | PRN
  Administered 2014-10-16: 100 mg via INTRAVENOUS

## 2014-10-16 MED ORDER — PHENYLEPHRINE 40 MCG/ML (10ML) SYRINGE FOR IV PUSH (FOR BLOOD PRESSURE SUPPORT)
PREFILLED_SYRINGE | INTRAVENOUS | Status: AC
  Filled 2014-10-16: qty 20

## 2014-10-16 MED ORDER — EPHEDRINE SULFATE 50 MG/ML IJ SOLN
INTRAMUSCULAR | Status: AC
  Filled 2014-10-16: qty 1

## 2014-10-16 MED ORDER — ONDANSETRON HCL 4 MG PO TABS: 4.0000 mg | ORAL_TABLET | Freq: Three times a day (TID) | ORAL | Status: DC | PRN

## 2014-10-16 MED ORDER — PHENYLEPHRINE HCL 10 MG/ML IJ SOLN
INTRAMUSCULAR | Status: DC | PRN
  Administered 2014-10-16: 80 ug via INTRAVENOUS

## 2014-10-16 MED ORDER — MIDAZOLAM HCL 2 MG/2ML IJ SOLN
INTRAMUSCULAR | Status: AC
  Filled 2014-10-16: qty 2

## 2014-10-16 MED ORDER — LACTATED RINGERS IV SOLN
INTRAVENOUS | Status: DC
  Administered 2014-10-16 (×2): via INTRAVENOUS

## 2014-10-16 MED ORDER — PROPOFOL 10 MG/ML IV BOLUS
INTRAVENOUS | Status: AC
  Filled 2014-10-16: qty 20

## 2014-10-16 MED ORDER — SUCCINYLCHOLINE CHLORIDE 20 MG/ML IJ SOLN
INTRAMUSCULAR | Status: DC | PRN
  Administered 2014-10-16: 100 mg via INTRAVENOUS

## 2014-10-16 MED ORDER — OXYCODONE-ACETAMINOPHEN 5-325 MG PO TABS: 1.0000 | ORAL_TABLET | ORAL | Status: DC | PRN

## 2014-10-16 MED ORDER — LIDOCAINE HCL (CARDIAC) 20 MG/ML IV SOLN
INTRAVENOUS | Status: AC
  Filled 2014-10-16: qty 5

## 2014-10-16 SURGICAL SUPPLY — 68 items
ANCH SUT SWLK 19.1X4.75 VT (Anchor) ×5 IMPLANT
ANCHOR PEEK 4.75X19.1 SWLK C (Anchor) ×10 IMPLANT
BLADE CUTTER GATOR 3.5 (BLADE) ×3 IMPLANT
BLADE GREAT WHITE 4.2 (BLADE) ×2 IMPLANT
BLADE GREAT WHITE 4.2MM (BLADE) ×1
BLADE SURG 11 STRL SS (BLADE) ×3 IMPLANT
BOOTCOVER CLEANROOM LRG (PROTECTIVE WEAR) ×6 IMPLANT
BUR 3.5 LG SPHERICAL (BURR) IMPLANT
BUR OVAL 4.0 (BURR) ×3 IMPLANT
BURR 3.5 LG SPHERICAL (BURR) ×2
BURR 3.5MM LG SPHERICAL (BURR) ×1
CANISTER SUCT LVC 12 LTR MEDI- (MISCELLANEOUS) ×3 IMPLANT
CANNULA ACUFLEX KIT 5X76 (CANNULA) ×3 IMPLANT
CANNULA DRILOCK 5.0MMX75MM (CANNULA) ×1
CANNULA DRILOCK 5.0X75 (CANNULA) ×1 IMPLANT
CLOSURE STERI-STRIP 1/2X4 (GAUZE/BANDAGES/DRESSINGS) ×1
CLOSURE WOUND 1/2 X4 (GAUZE/BANDAGES/DRESSINGS) ×1
CLSR STERI-STRIP ANTIMIC 1/2X4 (GAUZE/BANDAGES/DRESSINGS) ×1 IMPLANT
CONNECTOR 5 IN 1 STRAIGHT STRL (MISCELLANEOUS) ×3 IMPLANT
DRAPE INCISE 23X17 IOBAN STRL (DRAPES) ×2
DRAPE INCISE 23X17 STRL (DRAPES) ×1 IMPLANT
DRAPE INCISE IOBAN 23X17 STRL (DRAPES) ×1 IMPLANT
DRAPE STERI 35X30 U-POUCH (DRAPES) ×3 IMPLANT
DRAPE SURG 17X11 SM STRL (DRAPES) ×3 IMPLANT
DRAPE U-SHAPE 47X51 STRL (DRAPES) ×3 IMPLANT
DRSG PAD ABDOMINAL 8X10 ST (GAUZE/BANDAGES/DRESSINGS) ×4 IMPLANT
DURAPREP 26ML APPLICATOR (WOUND CARE) ×4 IMPLANT
ELECT REM PT RETURN 9FT ADLT (ELECTROSURGICAL) ×3
ELECTRODE REM PT RTRN 9FT ADLT (ELECTROSURGICAL) ×1 IMPLANT
GAUZE SPONGE 4X4 12PLY STRL (GAUZE/BANDAGES/DRESSINGS) ×3 IMPLANT
GLOVE BIO SURGEON STRL SZ7.5 (GLOVE) ×3 IMPLANT
GLOVE BIO SURGEON STRL SZ8 (GLOVE) ×3 IMPLANT
GLOVE EUDERMIC 7 POWDERFREE (GLOVE) ×3 IMPLANT
GLOVE SS BIOGEL STRL SZ 7.5 (GLOVE) ×1 IMPLANT
GLOVE SUPERSENSE BIOGEL SZ 7.5 (GLOVE) ×2
GOWN STRL REUS W/ TWL LRG LVL3 (GOWN DISPOSABLE) ×1 IMPLANT
GOWN STRL REUS W/ TWL XL LVL3 (GOWN DISPOSABLE) ×4 IMPLANT
GOWN STRL REUS W/TWL LRG LVL3 (GOWN DISPOSABLE) ×3
GOWN STRL REUS W/TWL XL LVL3 (GOWN DISPOSABLE) ×12
IV NS IRRIG 3000ML ARTHROMATIC (IV SOLUTION) ×8 IMPLANT
KIT BASIN OR (CUSTOM PROCEDURE TRAY) ×3 IMPLANT
KIT ROOM TURNOVER OR (KITS) ×3 IMPLANT
KIT SHOULDER TRACTION (DRAPES) ×3 IMPLANT
MANIFOLD NEPTUNE II (INSTRUMENTS) ×3 IMPLANT
NDL SPNL 18GX3.5 QUINCKE PK (NEEDLE) ×1 IMPLANT
NDL SUT 6 .5 CRC .975X.05 MAYO (NEEDLE) IMPLANT
NEEDLE MAYO TAPER (NEEDLE)
NEEDLE SPNL 18GX3.5 QUINCKE PK (NEEDLE) ×3 IMPLANT
NS IRRIG 1000ML POUR BTL (IV SOLUTION) ×3 IMPLANT
PACK SHOULDER (CUSTOM PROCEDURE TRAY) ×3 IMPLANT
PAD ARMBOARD 7.5X6 YLW CONV (MISCELLANEOUS) ×6 IMPLANT
SET ARTHROSCOPY TUBING (MISCELLANEOUS) ×3
SET ARTHROSCOPY TUBING LN (MISCELLANEOUS) ×1 IMPLANT
SLING ARM FOAM STRAP LRG (SOFTGOODS) ×2 IMPLANT
SLING ARM LRG ADULT FOAM STRAP (SOFTGOODS) ×2 IMPLANT
SLING ARM MED ADULT FOAM STRAP (SOFTGOODS) ×1 IMPLANT
SPONGE GAUZE 4X4 12PLY STER LF (GAUZE/BANDAGES/DRESSINGS) ×2 IMPLANT
SPONGE LAP 4X18 X RAY DECT (DISPOSABLE) ×3 IMPLANT
STRIP CLOSURE SKIN 1/2X4 (GAUZE/BANDAGES/DRESSINGS) ×2 IMPLANT
SUT MNCRL AB 3-0 PS2 18 (SUTURE) ×5 IMPLANT
SUT PDS AB 0 CT 36 (SUTURE) IMPLANT
SUT RETRIEVER GRASP 30 DEG (SUTURE) IMPLANT
SYR 20CC LL (SYRINGE) ×1 IMPLANT
TAPE PAPER 3X10 WHT MICROPORE (GAUZE/BANDAGES/DRESSINGS) ×3 IMPLANT
TOWEL OR 17X24 6PK STRL BLUE (TOWEL DISPOSABLE) ×3 IMPLANT
TOWEL OR 17X26 10 PK STRL BLUE (TOWEL DISPOSABLE) ×3 IMPLANT
WAND SUCTION MAX 4MM 90S (SURGICAL WAND) ×3 IMPLANT
WATER STERILE IRR 1000ML POUR (IV SOLUTION) ×3 IMPLANT

## 2014-10-16 NOTE — Transfer of Care (Signed)
Immediate Anesthesia Transfer of Care Note  Patient: [name]  Procedure(s) Performed: Procedure(s): RIGHT SHOULDER ARTHROSCOPY WITH SUBACROMIAL DECOMPRESSION, DISTAL CLAVICLE RESECTION AND ROTATOR CUFF REPAIR  (Right)  Patient Location: PACU  Anesthesia Type:General  Level of Consciousness: awake, alert  and oriented  Airway & Oxygen Therapy: Patient Spontanous Breathing and Patient connected to nasal cannula oxygen  Post-op Assessment: Report given to RN, Post -op Vital signs reviewed and stable and Patient moving all extremities  Post vital signs: Reviewed and stable  Last Vitals:  Filed Vitals:   10/16/14 0940  BP:   Pulse: 48  Temp:   Resp: 21    Complications: No apparent anesthesia complications

## 2014-10-16 NOTE — Anesthesia Preprocedure Evaluation (Signed)
Anesthesia Evaluation  Patient identified by MRN, date of birth, ID band Patient awake    Reviewed: Allergy & Precautions, NPO status , Patient's Chart, lab work & pertinent test results  Airway Mallampati: II  TM Distance: >3 FB Neck ROM: Limited    Dental  (+) Teeth Intact, Dental Advisory Given   Pulmonary former smoker,          Cardiovascular hypertension, Pt. on medications + CAD, + CABG and + Peripheral Vascular Disease     Neuro/Psych    GI/Hepatic   Endo/Other  diabetes, Well Controlled, Type 1Hypothyroidism   Renal/GU      Musculoskeletal   Abdominal   Peds  Hematology   Anesthesia Other Findings   Reproductive/Obstetrics                             Anesthesia Physical Anesthesia Plan  ASA: III  Anesthesia Plan: General and Regional   Post-op Pain Management:    Induction: Intravenous  Airway Management Planned: Oral ETT  Additional Equipment: None  Intra-op Plan:   Post-operative Plan: Extubation in OR  Informed Consent: I have reviewed the patients History and Physical, chart, labs and discussed the procedure including the risks, benefits and alternatives for the proposed anesthesia with the patient or authorized representative who has indicated his/her understanding and acceptance.   Dental advisory given  Plan Discussed with: CRNA, Anesthesiologist and Surgeon  Anesthesia Plan Comments:         Anesthesia Quick Evaluation

## 2014-10-16 NOTE — Discharge Instructions (Signed)
Katherine Kitchen  Metta [name]. Supple, M.D., F.A.A.O.S. Orthopaedic Surgery Specializing in Arthroscopic and Reconstructive Surgery of the Shoulder and Knee [phone] [address] [place], [place] [address] - Fax [phone]  POST-OP SHOULDER ARTHROSCOPIC ROTATOR CUFF  REPAIR INSTRUCTIONS  1. Call the office at [phone] to schedule your first post-op appointment 7-10 days from the date of your surgery.  2. Leave the steri-strips in place over your incisions when performing dressing changes and showering. You may remove your dressings and begin showering 72 hours from surgery. You can expect drainage that is clear to bloody in nature that occasionally will soak through your dressings. If this occurs go ahead and perform a dressing change. The drainage should lessen daily and when there is no drainage from your incisions feel free to go without a dressing.  3. Wear your sling/immobilizer at all times except to perform the exercises below or to occasionally let your arm dangle by your side to stretch your elbow. You also need to sleep in your sling immobilizer until instructed otherwise.  4. Range of motion to your elbow, wrist, and hand are encouraged 3-5 times daily. Exercise to your hand and fingers helps to reduce swelling you may experience.  5. Utilize ice to the shoulder 3-4 times minimum a day and additionally if you are experiencing pain.  6. You may one-armed drive when safely off of narcotics and muscle relaxants. You may use your hand that is in the sling to support the steering wheel only. However, should it be your right arm that is in the sling it is not to be used for gear shifting in a manual transmission.  7. If you had a block pre-operatively to provide post-op pain relief you may want to go ahead and begin utilizing your pain meds as your arm begins to wake up. Blocks can sometimes last up to 16-18 hours. If you are still pain-free prior to going to bed you may want to  strongly consider taking a pain medication to avoid being awakened in the night with the onset of pain. A muscle relaxant is also provided for you should you experience muscle spasms. It is recommended that if you are experiencing pain that your pain medication alone is not controlling, add the muscle relaxant along with the pain medication which can give additional pain relief. The first one to two days is generally the most severe of your pain and then should gradually decrease. As your pain lessens it is recommended that you decrease your use of the pain medications to an "as needed basis" only and to always comply with the recommended dosages of the pain medications.  8. Pain medications can produce constipation along with their use. If you experience this, the use of an over the counter stool softener or laxative daily is recommended.   9. For additional questions or concerns, please do not hesitate to call the office. If after hours there is an answering service to forward your concerns to the physician on call.  POST-OP EXERCISES  Pendulum Exercises  Perform pendulum exercises while standing and bending at the waist. Support your uninvolved arm on a table or chair and allow your operated arm to hang freely. Make sure to do these exercises passively - not using you shoulder muscle.  Repeat 20 times. Do 3 sessions per day.  What to eat:  For your first meals, you should eat lightly; only small meals initially.  If you do not have nausea, you may eat larger  meals.  Avoid spicy, greasy and heavy food.    General Anesthesia, Adult, Care After  Refer to this sheet in the next few weeks. These instructions provide you with information on caring for yourself after your procedure. Your health care provider may also give you more specific instructions. Your treatment has been planned according to current medical practices, but problems sometimes occur. Call your health care provider if you have any  problems or questions after your procedure.  WHAT TO EXPECT AFTER THE PROCEDURE  After the procedure, it is typical to experience:  Sleepiness.  Nausea and vomiting. HOME CARE INSTRUCTIONS  For the first 24 hours after general anesthesia:  Have a responsible person with you.  Do not drive a car. If you are alone, do not take public transportation.  Do not drink alcohol.  Do not take medicine that has not been prescribed by your health care provider.  Do not sign important papers or make important decisions.  You may resume a normal diet and activities as directed by your health care provider.  Change bandages (dressings) as directed.  If you have questions or problems that seem related to general anesthesia, call the hospital and ask for the anesthetist or anesthesiologist on call. SEEK MEDICAL CARE IF:  You have nausea and vomiting that continue the day after anesthesia.  You develop a rash. SEEK IMMEDIATE MEDICAL CARE IF:  You have difficulty breathing.  You have chest pain.  You have any allergic problems. Document Released: 11/28/2000 Document Revised: 04/24/2013 Document Reviewed: 03/07/2013  [facility] Patient Information 2014 [place], [place].   Sore Throat    A sore throat is a painful, burning, sore, or scratchy feeling of the throat. There may be pain or tenderness when swallowing or talking. You may have other symptoms with a sore throat. These include coughing, sneezing, fever, or a swollen neck. A sore throat is often the first sign of another sickness. These sicknesses may include a cold, flu, strep throat, or an infection called mono. Most sore throats go away without medical treatment.  HOME CARE  Only take medicine as told by your doctor.  Drink enough fluids to keep your pee (urine) clear or pale yellow.  Rest as needed.  Try using throat sprays, lozenges, or suck on hard candy (if older than 4 years or as told).  Sip warm liquids, such as broth, herbal tea, or warm  water with honey. Try sucking on frozen ice pops or drinking cold liquids.  Rinse the mouth (gargle) with salt water. Mix 1 teaspoon salt with 8 ounces of water.  Do not smoke. Avoid being around others when they are smoking.  Put a humidifier in your bedroom at night to moisten the air. You can also turn on a hot shower and sit in the bathroom for 5-10 minutes. Be sure the bathroom door is closed. GET HELP RIGHT AWAY IF:  You have trouble breathing.  You cannot swallow fluids, soft foods, or your spit (saliva).  You have more puffiness (swelling) in the throat.  Your sore throat does not get better in 7 days.  You feel sick to your stomach (nauseous) and throw up (vomit).  You have a fever or lasting symptoms for more than 2-3 days.  You have a fever and your symptoms suddenly get worse. MAKE SURE YOU:  Understand these instructions.  Will watch your condition.  Will get help right away if you are not doing well or get worse. Document Released: 05/31/2008 Document Revised:  05/16/2012 Document Reviewed: 04/29/2012  [employer] Patient Information 2015 [place], [place]. This information is not intended to replace advice given to you by your health care provider. Make sure you discuss any questions you have with your health care provider.

## 2014-10-16 NOTE — Op Note (Signed)
10/16/2014  12:13 PM  PATIENT:   Kathaleen Maser  81 y.o. female  PRE-OPERATIVE DIAGNOSIS: massive right shoulder rotator cuff tear, ac joint OA, impingement.  POST-OPERATIVE DIAGNOSIS:  Same with bicep tearing and instability  PROCEDURE:  RSA, Bicep tenotomy, SAD, DCR, RCR  SURGEON:  Christalynn Bo[place], Metta Clines. M.D.  ASSISTANTS: Shuford pac   ANESTHESIA:   GET + ISB  EBL: min  SPECIMEN:  none  Drains: none   PATIENT DISPOSITION:  PACU - hemodynamically stable.    PLAN OF CARE: Discharge to home after PACU  Dictation# [id number]

## 2014-10-16 NOTE — H&P (Signed)
[name]    Chief Complaint: right shoulder rotator cuff tear HPI: The patient is a 81 y.o. female s/p fall with massive right shoulder rotator cuff tear  Past Medical History  Diagnosis Date  . Diverticulitis of colon with perforation   . S/P CABG x 2 06/2011    Dr. Servando Snare (L-LAD, [facility])  . Carotid stenosis     dopplers 38/10: LICA 17-51%  . DM2 (diabetes mellitus, type 2)   . HTN (hypertension)   . HLD (hyperlipidemia)   . Hypothyroidism   . RLS (restless legs syndrome)   . Breast cancer, left breast 11/01/2011  . CAD (coronary artery disease)     LHC 06/14/11: LAD 80% and 80% after Dx, Dx 80% (Dx was small), oOM 80%, mOM 70% (OM was small), oRCA 70-80%, mRCA 60-70%, EF 55% - referred for CABG;   Echo 06/14/11: mild focal basal septal hypertrophy, grade 1 diast dysfxn, mild LAE, mild RVE, PASP 37.    . Kidney stones   . GERD (gastroesophageal reflux disease)   . Psoriasis   . Diabetes mellitus     x 5-6 yrs  . Breast cancer 10/20/11    L breast, ER/PR+, HER2 -  . S/P radiation therapy 12/20/11 - 01/10/12    Left Breast: 4256 cGy/ 16 Fractions  . Bronchitis     hx of  . History of kidney stones   . Arthritis     Past Surgical History  Procedure Laterality Date  . Colon resection  1977  . Arthroscopy right knee    . Off-pump coronary artery bypass grafting x2  06/16/2011  . Vein bypass surgery  1999  . Colon surgery      part colectomy-  . Appendectomy    . Breast biospy  11/04/11    Breast, Left, Needle Core Biopsy 6 Oclock, Benign Breast Tissue wtih Fibrocystic  Changes: Microcalcifications Identified, No Neoplasm or Malignancy Identified  . Breast biospy  10/20/11    Breast, Left, Needle Core Biopsy LOQ: Invsive ductal Carcinoman  . Abdominal hysterectomy      age 70's  . Breast lumpectomy Left 11/22/11    Breast, Lumpectomy, Left with Sentinel Node Biopsy: Invasive High Grade Ductal Carcinoma : No lymphovascular Invasion: High  Grade Ductal Carcinoma In-Situ with  Calcification and Comedo Necrosis  . Coronary artery bypass graft  10/12  . Cardiac catheterization  2012  . Colonoscopy      Family History  Problem Relation Age of Onset  . Cancer Mother     throat  . Hypertension Brother   . Cancer Paternal Uncle     Social History:  reports that she quit smoking about 34 years ago. Her smoking use included Cigarettes. She has a 6 pack-year smoking history. She does not have any smokeless tobacco history on file. She reports that she drinks alcohol. She reports that she does not use illicit drugs.  Allergies: No Known Allergies  Medications Prior to Admission  Medication Sig Dispense Refill  . anastrozole (ARIMIDEX) 1 MG tablet TAKE 1 TABLET DAILY 90 tablet 2  . aspirin 81 MG tablet Take 81 mg by mouth daily.      Marland Kitchen CALCITRIOL PO Take 2 capsules by mouth daily.    . Calcium Carbonate (OS-CAL PO) Take 1 tablet by mouth daily.    Marland Kitchen CALCIUM PO Take 2 capsules by mouth at bedtime.    . Canagliflozin (INVOKANA) 300 MG TABS Take 1 tablet by mouth every morning.     Marland Kitchen  Cholecalciferol (VITAMIN D-3) 5000 UNITS TABS Take 1 tablet by mouth daily.    . CRESTOR 20 MG tablet Take 20 mg by mouth daily.     Marland Kitchen esomeprazole (NEXIUM) 40 MG capsule Take 40 mg by mouth daily before breakfast.      . ezetimibe (ZETIA) 10 MG tablet Take 10 mg by mouth daily.      . furosemide (LASIX) 40 MG tablet Take 1 tablet (40 mg total) by mouth daily. 90 tablet 3  . KLOR-CON M10 10 MEQ tablet TAKE 1 TABLET DAILY 90 tablet 1  . levothyroxine (SYNTHROID, LEVOTHROID) 137 MCG tablet Take 137 mcg by mouth daily before breakfast.    . losartan (COZAAR) 50 MG tablet Take 1 tablet (50 mg total) by mouth 2 (two) times daily. 180 tablet 3  . metoprolol (LOPRESSOR) 50 MG tablet TAKE 1 AND 1/2 TABLETS     TWICE DAILY 270 tablet 1  . Multiple Vitamin (MULTIVITAMIN) capsule Take 1 capsule by mouth daily.      . Multiple Vitamins-Minerals (EYE VITAMINS) CAPS Take 1 capsule by mouth daily.      . pioglitazone (ACTOS) 15 MG tablet Take 15 mg by mouth daily.    Marland Kitchen rOPINIRole (REQUIP) 3 MG tablet Take 3 mg by mouth at bedtime.     . cetirizine (ZYRTEC) 10 MG tablet Take 10 mg by mouth daily as needed (allergies).     . diazepam (VALIUM) 5 MG tablet Take 1 tablet (5 mg total) by mouth every 6 (six) hours as needed. (Patient not taking: Reported on 10/07/2014) 30 tablet 0  . diazepam (VALIUM) 5 MG tablet Take 1 tablet (5 mg total) by mouth 2 (two) times daily. 20 tablet 0  . ibuprofen (ADVIL,MOTRIN) 200 MG tablet Take 400 mg by mouth every 6 (six) hours as needed for moderate pain (shoulder pain).    Marland Kitchen oxyCODONE-acetaminophen (PERCOCET) 5-325 MG per tablet Take 1-2 tablets by mouth every 6 (six) hours as needed. (Patient not taking: Reported on 10/07/2014) 10 tablet 0     Physical Exam: right shoulder with profound weakness and loss of motion as noted at recent office visits  Vitals  Temp:  [97.3 F (36.3 C)] 97.3 F (36.3 C) (02/11 0824) Pulse Rate:  [54] 54 (02/11 0824) Resp:  [20] 20 (02/11 0824) BP: (163)/(41) 163/41 mmHg (02/11 0824) SpO2:  [96 %] 96 % (02/11 0824) Weight:  [85.276 kg (188 lb)] 85.276 kg (188 lb) (02/11 0824)  Assessment/Plan  Impression: right shoulder rotator cuff tear  Plan of Action: Procedure(s): RIGHT SHOULDER ARTHROSCOPY WITH SUBACROMIAL DECOMPRESSION, DISTAL CLAVICLE RESECTION AND ROTATOR CUFF REPAIR   [name] 10/16/2014, 9:23 AM

## 2014-10-16 NOTE — Progress Notes (Signed)
Patient states she is having difficulty taking a deep breath.  Patient has had a peripheral nerve block for surgery.  Dr. Tamala Julian notified, stated that this is a normal finding and no new orders.

## 2014-10-16 NOTE — Anesthesia Procedure Notes (Addendum)
Anesthesia Regional Block:  Interscalene brachial plexus block  Pre-Anesthetic Checklist: ,, timeout performed, Correct Patient, Correct Site, Correct Laterality, Correct Procedure, Correct Position, site marked, Risks and benefits discussed,  Surgical consent,  Pre-op evaluation,  At surgeon's request and post-op pain management  Laterality: Right  Prep: chloraprep and alcohol swabs       Needles:  Injection technique: Single-shot  Needle Type: Stimulator Needle - 40        Needle insertion depth: 4 cm   Additional Needles:  Procedures: nerve stimulator Interscalene brachial plexus block  Nerve Stimulator or Paresthesia:  Response: 0.5 mA, 0.1 ms, 4 cm  Additional Responses:   Narrative:  Start time: 10/16/2014 9:30 AM End time: 10/16/2014 9:40 AM Injection made incrementally with aspirations every 5 mL.  Performed by: Personally  Anesthesiologist: Kate Sable  Additional Notes: Pt accepts procedure w/ risks. 20cc 0.5% Marcaine w/ epi w/o discomfort or difficulty. GES   Procedure Name: Intubation Date/Time: 10/16/2014 9:56 AM Performed by: Melina Copa, Parker Sawatzky R Pre-anesthesia Checklist: Patient identified, Emergency Drugs available, Suction available, Patient being monitored and Timeout performed Patient Re-evaluated:Patient Re-evaluated prior to inductionOxygen Delivery Method: Circle system utilized Preoxygenation: Pre-oxygenation with 100% oxygen Intubation Type: IV induction Ventilation: Mask ventilation without difficulty Laryngoscope Size: Mac and 3 Grade View: Grade II Tube type: Oral Tube size: 7.5 mm Number of attempts: 2 (First attempt with Grade III view, ETT into esophagus, immediately recognized and removed, Grade II view, AOI) Airway Equipment and Method: Stylet Placement Confirmation: ETT inserted through vocal cords under direct vision and positive ETCO2 Secured at: 21 cm Tube secured with: Tape Dental Injury: Teeth and Oropharynx as per  pre-operative assessment

## 2014-10-17 ENCOUNTER — Encounter (HOSPITAL_COMMUNITY): Payer: Self-pay | Admitting: Orthopedic Surgery

## 2014-10-17 NOTE — Anesthesia Postprocedure Evaluation (Signed)
  Anesthesia Post-op Note  Patient: [name]  Procedure(s) Performed: Procedure(s): RIGHT SHOULDER ARTHROSCOPY WITH SUBACROMIAL DECOMPRESSION, DISTAL CLAVICLE RESECTION AND ROTATOR CUFF REPAIR  (Right)  Patient Location: PACU  Anesthesia Type:General and GA combined with regional for post-op pain  Level of Consciousness: awake, alert , oriented and patient cooperative  Airway and Oxygen Therapy: Patient Spontanous Breathing  Post-op Pain: none  Post-op Assessment: Post-op Vital signs reviewed, Patient's Cardiovascular Status Stable, Respiratory Function Stable, Patent Airway, No signs of Nausea or vomiting and Pain level controlled  Post-op Vital Signs: stable  Last Vitals:  Filed Vitals:   10/16/14 1345  BP: 166/56  Pulse: 48  Temp:   Resp:     Complications: No apparent anesthesia complications

## 2014-10-17 NOTE — Op Note (Signed)
[name], [name] NO.:  [id number]  MEDICAL RECORD NO.:  [id number]  LOCATION:                                 FACILITY:  PHYSICIAN:  Metta Clines. Itzamar Traynor, M.D.  DATE OF BIRTH:  [birth year]  DATE OF PROCEDURE:  10/16/2014 DATE OF DISCHARGE:  10/16/2014                              OPERATIVE REPORT   PREOPERATIVE DIAGNOSES: 1. Massive retracted right shoulder rotator cuff tear. 2. Shoulder impingement. 3. Shoulder acromioclavicular joint arthropathy.  POSTOPERATIVE DIAGNOSES: 1. Massive retracted right shoulder rotator cuff tear. 2. Shoulder impingement. 3. Shoulder acromioclavicular joint arthropathy. 4. Biceps tearing and instability.  PROCEDURE: 1. Right shoulder examination under anesthesia and right shoulder     glenohumeral joint diagnostic arthroscopy. 2. Biceps tendon tenotomy. 3. Arthroscopic subacromial decompression bursectomy. 4. Arthroscopic distal clavicle resection. 5. Arthroscopic repair of a massive rotator cuff tear greater than 5     cm in width involving the upper subscap, supra, and infraspinatus.  SURGEON:  Metta Clines. Nazier Neyhart, MD  ASSISTANT:  Reather Laurence. Shuford, PA-C  ANESTHESIA:  General endotracheal as well as interscalene block.  ESTIMATED BLOOD LOSS:  Minimal.  DRAINS:  None.  HISTORY:  [name] is a 81 year old female, who fell this past October injuring the right shoulder with subsequent profound weakness and loss of motion in the right shoulder with MRI scan showing evidence for massive retracted tear of the rotator cuff.  Due to the acute onset of her symptoms and ongoing significant pain and functional limitations, she was brought to the operating room at this time for planned right shoulder arthroscopy with rotator repair as described below.  Preoperatively, I counseled Ms. Vanhoesen regarding treatment options and risks versus benefits thereof.  Possible surgical complications were all reviewed including potential for  bleeding, infection, neurovascular injury, persistent pain, loss of motion, anesthetic complication, recurrence of rotator cuff tear, and possible need for additional surgery.  She understands and accepts and agrees to planned procedure.  PROCEDURE IN DETAIL:  After undergoing routine preop evaluation, the patient received prophylactic antibiotics.  An interscalene block was established in the holding area by the Anesthesia Department.  The patient received prophylactic antibiotics, brought to the operating room, placed supine on the operating table, underwent smooth induction of a general endotracheal anesthesia.  Turned to left lateral decubitus position on a beanbag and appropriately padded and protected.  Right shoulder examination revealed full motion.  No obvious instability patterns.  The right arm was then suspended at 70 degrees of abduction with 10 pounds of traction.  The right shoulder girdle region was sterilely prepped and draped in standard fashion.  Time-out was called. Posterior portal was established in the glenohumeral joint and anterior portal was established under direct visualization.  The biceps tendon showed significant fraying of the intra-articular segment and medial subluxation due to the large rotator cuff tear.  Given these findings, we performed a biceps tenotomy and then performed debridement of the labrum.  Articular surfaces were otherwise in good condition.  There were very large and retracted tear of the rotator cuff with the apex of the tear at the level of the glenoid.  We then completed inspection within the glenohumeral  joint.  Fluid and instruments removed.  The arm was dropped down to 30 degrees abduction with the arthroscope in the subacromial space of the posterior portal and a direct lateral portal site in subacromial space.  Abundant dense bursal tissue and multiple adhesions were encountered and these were all divided and excised with  a combination of shaver and Stryker wand.  The wand was then used to remove the periosteum from the undersurface of the anterior half of the acromion.  The subacromial decompression was performed with bur creating a type 1 morphology.  Portal was then established directly anterior to the distal clavicle.  The distal clavicle resection was performed with a bur.  Care was taken to confirm visualization of entire circumference of the distal clavicle to ensure adequate removal of bone.  We then completed the subacromial/subdeltoid bursectomy.  The rotator cuff was then mobilized both articular and bursal sides with division of adhesions posteriorly anteriorly and divided the coracoid humeral ligament.  So with that, we could mobilize the upper subscapularis appropriately.  At this point, the greater tuberosity was then prepared removing soft tissue and gently abrading the bone with footprint approximately 5 cm in width.  Through a stab wound off lateral margin of the acromion, we placed 2 Arthrex PEEK SwiveLock suture anchors equidistant across the width of footprint and the first 2 suture limbs were placed at the upper subscapularis and the balance of the remaining 6 suture limbs were placed through the supraspinatus and infraspinatus. We then created a "double row" with 2 lateral SwiveLock which allowed [place] to nicely compress the margin of the rotator cuff against bony bed and tuberosity.  Overall construct was much to our satisfaction.  Suture limbs were all clipped.  Bursectomy was completed.  Hemostasis was obtained.  Fluid and instruments were removed.  Portals were closed with Monocryl and Steri-Strips.  A dry dressing taped at the right shoulder and right arm was placed in a sling.  The patient was awakened, extubated, and taken to recovery room in stable condition.  Tracy A. Shuford, PA-C was used as an Environmental consultant throughout this case, essential for help with positioning the patient,  positioning the extremity, management of the arthroscopic equipment, tissue manipulations, suture management, wound closure, and intraoperative decision-making.     Metta Clines. Trevel Dillenbeck, M.D.     KMS/MEDQ  D:  10/16/2014  T:  10/17/2014  Job:  [id number]

## 2014-10-24 DIAGNOSIS — S46011D Strain of muscle(s) and tendon(s) of the rotator cuff of right shoulder, subsequent encounter: Secondary | ICD-10-CM | POA: Diagnosis not present

## 2014-10-24 DIAGNOSIS — Z4789 Encounter for other orthopedic aftercare: Secondary | ICD-10-CM | POA: Diagnosis not present

## 2014-10-27 DIAGNOSIS — S46011D Strain of muscle(s) and tendon(s) of the rotator cuff of right shoulder, subsequent encounter: Secondary | ICD-10-CM | POA: Diagnosis not present

## 2014-10-31 DIAGNOSIS — S46011D Strain of muscle(s) and tendon(s) of the rotator cuff of right shoulder, subsequent encounter: Secondary | ICD-10-CM | POA: Diagnosis not present

## 2014-11-03 DIAGNOSIS — S46011D Strain of muscle(s) and tendon(s) of the rotator cuff of right shoulder, subsequent encounter: Secondary | ICD-10-CM | POA: Diagnosis not present

## 2014-11-07 DIAGNOSIS — S46011D Strain of muscle(s) and tendon(s) of the rotator cuff of right shoulder, subsequent encounter: Secondary | ICD-10-CM | POA: Diagnosis not present

## 2014-11-11 DIAGNOSIS — S46011D Strain of muscle(s) and tendon(s) of the rotator cuff of right shoulder, subsequent encounter: Secondary | ICD-10-CM | POA: Diagnosis not present

## 2014-11-14 DIAGNOSIS — S46011D Strain of muscle(s) and tendon(s) of the rotator cuff of right shoulder, subsequent encounter: Secondary | ICD-10-CM | POA: Diagnosis not present

## 2014-11-18 DIAGNOSIS — S46011D Strain of muscle(s) and tendon(s) of the rotator cuff of right shoulder, subsequent encounter: Secondary | ICD-10-CM | POA: Diagnosis not present

## 2014-11-20 DIAGNOSIS — S46011D Strain of muscle(s) and tendon(s) of the rotator cuff of right shoulder, subsequent encounter: Secondary | ICD-10-CM | POA: Diagnosis not present

## 2014-11-24 ENCOUNTER — Other Ambulatory Visit: Payer: Self-pay | Admitting: Cardiovascular Disease

## 2014-11-24 DIAGNOSIS — S46011D Strain of muscle(s) and tendon(s) of the rotator cuff of right shoulder, subsequent encounter: Secondary | ICD-10-CM | POA: Diagnosis not present

## 2014-11-26 DIAGNOSIS — S46011D Strain of muscle(s) and tendon(s) of the rotator cuff of right shoulder, subsequent encounter: Secondary | ICD-10-CM | POA: Diagnosis not present

## 2014-12-01 DIAGNOSIS — Z4789 Encounter for other orthopedic aftercare: Secondary | ICD-10-CM | POA: Diagnosis not present

## 2014-12-01 DIAGNOSIS — S46011D Strain of muscle(s) and tendon(s) of the rotator cuff of right shoulder, subsequent encounter: Secondary | ICD-10-CM | POA: Diagnosis not present

## 2014-12-02 ENCOUNTER — Other Ambulatory Visit: Payer: Self-pay | Admitting: Endocrinology

## 2014-12-02 DIAGNOSIS — S46011D Strain of muscle(s) and tendon(s) of the rotator cuff of right shoulder, subsequent encounter: Secondary | ICD-10-CM | POA: Diagnosis not present

## 2014-12-02 DIAGNOSIS — Z9889 Other specified postprocedural states: Secondary | ICD-10-CM

## 2014-12-02 DIAGNOSIS — Z1231 Encounter for screening mammogram for malignant neoplasm of breast: Secondary | ICD-10-CM

## 2014-12-04 DIAGNOSIS — S46011D Strain of muscle(s) and tendon(s) of the rotator cuff of right shoulder, subsequent encounter: Secondary | ICD-10-CM | POA: Diagnosis not present

## 2014-12-09 ENCOUNTER — Other Ambulatory Visit: Payer: Self-pay | Admitting: Cardiovascular Disease

## 2014-12-09 DIAGNOSIS — S46011D Strain of muscle(s) and tendon(s) of the rotator cuff of right shoulder, subsequent encounter: Secondary | ICD-10-CM | POA: Diagnosis not present

## 2014-12-11 DIAGNOSIS — S46011D Strain of muscle(s) and tendon(s) of the rotator cuff of right shoulder, subsequent encounter: Secondary | ICD-10-CM | POA: Diagnosis not present

## 2014-12-16 ENCOUNTER — Other Ambulatory Visit: Payer: Self-pay | Admitting: Endocrinology

## 2014-12-16 DIAGNOSIS — Z9889 Other specified postprocedural states: Secondary | ICD-10-CM

## 2014-12-16 DIAGNOSIS — S46011D Strain of muscle(s) and tendon(s) of the rotator cuff of right shoulder, subsequent encounter: Secondary | ICD-10-CM | POA: Diagnosis not present

## 2014-12-17 ENCOUNTER — Ambulatory Visit
Admission: RE | Admit: 2014-12-17 | Discharge: 2014-12-17 | Disposition: A | Discharge status: Home / Self Care | Payer: Medicare Other | Source: Ambulatory Visit | Attending: Endocrinology | Admitting: Endocrinology

## 2014-12-17 DIAGNOSIS — R922 Inconclusive mammogram: Secondary | ICD-10-CM | POA: Diagnosis not present

## 2014-12-17 DIAGNOSIS — Z853 Personal history of malignant neoplasm of breast: Secondary | ICD-10-CM | POA: Diagnosis not present

## 2014-12-17 DIAGNOSIS — Z9889 Other specified postprocedural states: Secondary | ICD-10-CM

## 2014-12-18 ENCOUNTER — Other Ambulatory Visit: Payer: Self-pay | Admitting: Cardiovascular Disease

## 2014-12-23 DIAGNOSIS — S46011D Strain of muscle(s) and tendon(s) of the rotator cuff of right shoulder, subsequent encounter: Secondary | ICD-10-CM | POA: Diagnosis not present

## 2014-12-24 DIAGNOSIS — C50912 Malignant neoplasm of unspecified site of left female breast: Secondary | ICD-10-CM | POA: Diagnosis not present

## 2014-12-26 DIAGNOSIS — S46011D Strain of muscle(s) and tendon(s) of the rotator cuff of right shoulder, subsequent encounter: Secondary | ICD-10-CM | POA: Diagnosis not present

## 2014-12-29 DIAGNOSIS — Z4789 Encounter for other orthopedic aftercare: Secondary | ICD-10-CM | POA: Diagnosis not present

## 2014-12-29 DIAGNOSIS — S46011D Strain of muscle(s) and tendon(s) of the rotator cuff of right shoulder, subsequent encounter: Secondary | ICD-10-CM | POA: Diagnosis not present

## 2014-12-30 DIAGNOSIS — E782 Mixed hyperlipidemia: Secondary | ICD-10-CM | POA: Diagnosis not present

## 2014-12-30 DIAGNOSIS — E038 Other specified hypothyroidism: Secondary | ICD-10-CM | POA: Diagnosis not present

## 2014-12-30 DIAGNOSIS — E119 Type 2 diabetes mellitus without complications: Secondary | ICD-10-CM | POA: Diagnosis not present

## 2014-12-31 DIAGNOSIS — S46011D Strain of muscle(s) and tendon(s) of the rotator cuff of right shoulder, subsequent encounter: Secondary | ICD-10-CM | POA: Diagnosis not present

## 2015-01-02 DIAGNOSIS — I2510 Atherosclerotic heart disease of native coronary artery without angina pectoris: Secondary | ICD-10-CM | POA: Diagnosis not present

## 2015-01-02 DIAGNOSIS — S46011D Strain of muscle(s) and tendon(s) of the rotator cuff of right shoulder, subsequent encounter: Secondary | ICD-10-CM | POA: Diagnosis not present

## 2015-01-02 DIAGNOSIS — E119 Type 2 diabetes mellitus without complications: Secondary | ICD-10-CM | POA: Diagnosis not present

## 2015-01-02 DIAGNOSIS — E782 Mixed hyperlipidemia: Secondary | ICD-10-CM | POA: Diagnosis not present

## 2015-01-02 DIAGNOSIS — K219 Gastro-esophageal reflux disease without esophagitis: Secondary | ICD-10-CM | POA: Diagnosis not present

## 2015-01-02 DIAGNOSIS — M810 Age-related osteoporosis without current pathological fracture: Secondary | ICD-10-CM | POA: Diagnosis not present

## 2015-01-02 DIAGNOSIS — E038 Other specified hypothyroidism: Secondary | ICD-10-CM | POA: Diagnosis not present

## 2015-01-02 DIAGNOSIS — I10 Essential (primary) hypertension: Secondary | ICD-10-CM | POA: Diagnosis not present

## 2015-01-06 DIAGNOSIS — S46011D Strain of muscle(s) and tendon(s) of the rotator cuff of right shoulder, subsequent encounter: Secondary | ICD-10-CM | POA: Diagnosis not present

## 2015-01-08 DIAGNOSIS — S46011D Strain of muscle(s) and tendon(s) of the rotator cuff of right shoulder, subsequent encounter: Secondary | ICD-10-CM | POA: Diagnosis not present

## 2015-01-26 DIAGNOSIS — Z4789 Encounter for other orthopedic aftercare: Secondary | ICD-10-CM | POA: Diagnosis not present

## 2015-02-12 ENCOUNTER — Encounter: Payer: Self-pay | Admitting: Cardiovascular Disease

## 2015-02-12 ENCOUNTER — Ambulatory Visit (INDEPENDENT_AMBULATORY_CARE_PROVIDER_SITE_OTHER): Payer: Medicare Other | Admitting: Cardiovascular Disease

## 2015-02-12 VITALS — BP 110/50 | HR 64 | Ht 63.0 in | Wt 189.8 lb

## 2015-02-12 DIAGNOSIS — I25810 Atherosclerosis of coronary artery bypass graft(s) without angina pectoris: Secondary | ICD-10-CM | POA: Diagnosis not present

## 2015-02-12 DIAGNOSIS — E785 Hyperlipidemia, unspecified: Secondary | ICD-10-CM | POA: Diagnosis not present

## 2015-02-12 DIAGNOSIS — I447 Left bundle-branch block, unspecified: Secondary | ICD-10-CM

## 2015-02-12 DIAGNOSIS — I5032 Chronic diastolic (congestive) heart failure: Secondary | ICD-10-CM

## 2015-02-12 DIAGNOSIS — I10 Essential (primary) hypertension: Secondary | ICD-10-CM

## 2015-02-12 DIAGNOSIS — I739 Peripheral vascular disease, unspecified: Secondary | ICD-10-CM

## 2015-02-12 DIAGNOSIS — I779 Disorder of arteries and arterioles, unspecified: Secondary | ICD-10-CM

## 2015-02-12 NOTE — Progress Notes (Signed)
Chief Complaint  Patient presents with  . Shortness of Breath    History of Present Illness: 81 yo female with history of CAD s/p CABG 2012, HTN, HLD, DM, carotid artery disease, hypothyroidism, breast cancer here today for cardiac follow up. She has been followed by Dr. Lia Foyer. She presented with unstable angina in October 2012 and was found to have severe triple vessel CAD by cath, followed by 2V CABG per Dr. Servando Snare (LIMA to LAD, SVG to RCA). She has done well since then. Breast cancer s/p lumpectomy and has completed radiation. She takes Arimidex. I met her in November 2014 and at our first visit, she c/o dyspnea. BP was elevated and Cozaar was added. Echo 07/29/13 with normal LVEF, normal PA pressures, no valve disease. Carotid dopplers 08/09/13 with mild bilateral carotid artery disease. Stress myoview arranged 08/11/14 in workup of jaw and neck pain and showed no ischemia.   She is here today for follow up. No chest pain. Occasional SOB with moderate exertion. No dizziness, near syncope or syncope.    Primary Care Physician: Legrand Como Altheimer  Last Lipid Profile: Followed in primary care.    Past Medical History  Diagnosis Date  . Diverticulitis of colon with perforation   . S/P CABG x 2 06/2011    Dr. Servando Snare (L-LAD, [facility])  . Carotid stenosis     dopplers 50/38: LICA 88-28%  . DM2 (diabetes mellitus, type 2)   . HTN (hypertension)   . HLD (hyperlipidemia)   . Hypothyroidism   . RLS (restless legs syndrome)   . Breast cancer, left breast 11/01/2011  . CAD (coronary artery disease)     LHC 06/14/11: LAD 80% and 80% after Dx, Dx 80% (Dx was small), oOM 80%, mOM 70% (OM was small), oRCA 70-80%, mRCA 60-70%, EF 55% - referred for CABG;   Echo 06/14/11: mild focal basal septal hypertrophy, grade 1 diast dysfxn, mild LAE, mild RVE, PASP 37.    . Kidney stones   . GERD (gastroesophageal reflux disease)   . Psoriasis   . Diabetes mellitus     x 5-6 yrs  . Breast cancer 10/20/11      L breast, ER/PR+, HER2 -  . S/P radiation therapy 12/20/11 - 01/10/12    Left Breast: 4256 cGy/ 16 Fractions  . Bronchitis     hx of  . History of kidney stones   . Arthritis     Past Surgical History  Procedure Laterality Date  . Colon resection  1977  . Arthroscopy right knee    . Off-pump coronary artery bypass grafting x2  06/16/2011  . Vein bypass surgery  1999  . Colon surgery      part colectomy-  . Appendectomy    . Breast biospy  11/04/11    Breast, Left, Needle Core Biopsy 6 Oclock, Benign Breast Tissue wtih Fibrocystic  Changes: Microcalcifications Identified, No Neoplasm or Malignancy Identified  . Breast biospy  10/20/11    Breast, Left, Needle Core Biopsy LOQ: Invsive ductal Carcinoman  . Abdominal hysterectomy      age 37's  . Breast lumpectomy Left 11/22/11    Breast, Lumpectomy, Left with Sentinel Node Biopsy: Invasive High Grade Ductal Carcinoma : No lymphovascular Invasion: High  Grade Ductal Carcinoma In-Situ with Calcification and Comedo Necrosis  . Coronary artery bypass graft  10/12  . Cardiac catheterization  2012  . Colonoscopy    . Shoulder arthroscopy with subacromial decompression, rotator cuff repair and bicep tendon repair Right  10/16/2014    Procedure: RIGHT SHOULDER ARTHROSCOPY WITH SUBACROMIAL DECOMPRESSION, DISTAL CLAVICLE RESECTION AND ROTATOR CUFF REPAIR ;  Surgeon: Marin Shutter, MD;  Location: [place];  Service: Orthopedics;  Laterality: Right;    Current Outpatient Prescriptions  Medication Sig Dispense Refill  . anastrozole (ARIMIDEX) 1 MG tablet TAKE 1 TABLET DAILY 90 tablet 2  . aspirin 81 MG tablet Take 81 mg by mouth daily.      . Biotin 5 MG CAPS Take 1 capsule by mouth daily.    Marland Kitchen CALCITRIOL PO Take 2 capsules by mouth daily.    . Calcium Carbonate (OS-CAL PO) Take 1 tablet by mouth daily.    Marland Kitchen CALCIUM PO Take 2 capsules by mouth at bedtime.    . Canagliflozin (INVOKANA) 300 MG TABS Take 1 tablet by mouth every morning.     .  cetirizine (ZYRTEC) 10 MG tablet Take 10 mg by mouth daily as needed (allergies).     . Cholecalciferol (VITAMIN D-3) 5000 UNITS TABS Take 1 tablet by mouth daily.    . CRESTOR 20 MG tablet Take 20 mg by mouth daily.     . diazepam (VALIUM) 5 MG tablet Take 0.5-1 tablets (2.5-5 mg total) by mouth every 6 (six) hours as needed for muscle spasms or sedation. 40 tablet 1  . esomeprazole (NEXIUM) 40 MG capsule Take 40 mg by mouth daily before breakfast.      . ezetimibe (ZETIA) 10 MG tablet Take 10 mg by mouth daily.      . furosemide (LASIX) 40 MG tablet Take 1 tablet (40 mg total) by mouth daily. 90 tablet 3  . ibuprofen (ADVIL,MOTRIN) 200 MG tablet Take 400 mg by mouth every 6 (six) hours as needed for moderate pain (shoulder pain).    Marland Kitchen KLOR-CON M10 10 MEQ tablet TAKE 1 TABLET DAILY 90 tablet 0  . levothyroxine (SYNTHROID, LEVOTHROID) 137 MCG tablet Take 137 mcg by mouth daily before breakfast.    . losartan (COZAAR) 50 MG tablet TAKE 1 TABLET TWICE A DAY 180 tablet 0  . metoprolol (LOPRESSOR) 50 MG tablet TAKE 1 AND 1/2 TABLETS     TWICE DAILY 270 tablet 0  . Multiple Vitamin (MULTIVITAMIN) capsule Take 1 capsule by mouth daily.      . Multiple Vitamins-Minerals (EYE VITAMINS) CAPS Take 1 capsule by mouth daily.     . ondansetron (ZOFRAN) 4 MG tablet Take 1 tablet (4 mg total) by mouth every 8 (eight) hours as needed for nausea or vomiting. 20 tablet 0  . pioglitazone (ACTOS) 15 MG tablet Take 15 mg by mouth daily.    Marland Kitchen rOPINIRole (REQUIP) 3 MG tablet Take 3 mg by mouth at bedtime.      No current facility-administered medications for this visit.    No Known Allergies  History   Social History  . Marital Status: Married    Spouse Name: N/A  . Number of Children: 1  . Years of Education: N/A   Occupational History  . RETIRED-worked for an accounting firm    Social History Main Topics  . Smoking status: Former Smoker -- 1.00 packs/day for 6 years    Types: Cigarettes    Quit date:  09/05/1980  . Smokeless tobacco: Not on file  . Alcohol Use: Yes     Comment: socially  . Drug Use: No  . Sexual Activity: Not on file     Comment: menarche 75, p2, 1 stillbirth, 1st preg age 52, HRT x many  yrs   Other Topics Concern  . Not on file   Social History Narrative    Family History  Problem Relation Age of Onset  . Cancer Mother     throat  . Heart disease Mother   . Hypertension Brother   . Cancer Paternal Uncle   . Heart disease Father     Review of Systems:  As stated in the HPI and otherwise negative.   BP 110/50 mmHg  Pulse 64  Ht 5' 3"  (1.6 m)  Wt 189 lb 12.8 oz (86.093 kg)  BMI 33.63 kg/m2  Physical Examination: General: Well developed, well nourished, NAD HEENT: OP clear, mucus membranes moist SKIN: warm, dry. No rashes. Neuro: No focal deficits Musculoskeletal: Muscle strength 5/5 all ext Psychiatric: Mood and affect normal Neck: No JVD, no carotid bruits, no thyromegaly, no lymphadenopathy. Lungs:Clear bilaterally, no wheezes, rhonci, crackles Cardiovascular: Regular rate and rhythm. No murmurs, gallops or rubs. Abdomen:Soft. Bowel sounds present. Non-tender.  Extremities: Trace to 1+ bilateral lower extremity edema. Pulses are 2 + in the bilateral DP/PT.  Cardiac cath 06/12/11: 2. The left main is free of critical disease.  3. The LAD has a bifurcation stenosis. There is about 80% involvement  of the LAD and then 80% involvement of diagonal. There is 80%  lesion in the LAD just beyond the diagonal. The distal LAD is  suitable for grafting.  4. The circumflex provides a first marginal that has probably 80%  ostial and then a 70% mid lesion. The remaining portion of the  circumflex is fairly large and is without critical narrowing other  than minor luminal irregularity.  5. The right coronary artery has clear-cut 70-80% ostial stenosis.  There is also a 60-70% mid vessel stenosis. The PDA and PLA are  suitable for grafting.  6.  Ventriculography in the RAO projection reveals well-preserved left  ventricular function with a normal ejection fraction 55% by  estimate. I did not appreciate significant regurgitation.  Echo 07/29/13: Left ventricle: The cavity size was normal. Wall thickness was increased in a pattern of mild LVH. Systolic function was normal. The estimated ejection fraction was in the range of 50% to 55%. Doppler parameters are consistent with abnormal left ventricular relaxation (grade 1 diastolic dysfunction). - Pulmonary arteries: PA peak pressure: 48m Hg (S).  Stress myoview 08/11/14: Stress Procedure: The patient received IV adenosine at 140 mcg/kg/min for 4 minutes. Technetium [id number] was injected at the 2 minute mark and quantitative spect images were obtained after a 45 minute delay. During the infusion of Adenosine, the patient complained of chest, jaw and neck tightness as well as SOB. These symptoms resolved in recovery.  Stress ECG: No significant change from baseline ECG  QPS Raw Data Images: Normal; no motion artifact; normal heart/lung ratio. Stress Images: Normal homogeneous uptake in all areas of the myocardium. Rest Images: Normal homogeneous uptake in all areas of the myocardium. Subtraction (SDS): No evidence of ischemia. Transient Ischemic Dilatation (Normal <1.22): 0.93 Lung/Heart Ratio (Normal <0.45): 0.30  Quantitative Gated Spect Images QGS EDV: 86 ml QGS ESV: 16 ml  Impression Exercise Capacity: Adenosine study with no exercise. BP Response: Normal blood pressure response. Clinical Symptoms: Mild chest pain/dyspnea. ECG Impression: Baseline: LBBB. EKG uninterpretable due to LBBB at rest and stress. Comparison with Prior Nuclear Study: No previous nuclear study performed  Overall Impression: Low risk stress nuclear study. No evidence of ischemia by perfusion. LBBB present at rest and with stress. Vigorous LV systolic function.  LV  Ejection  Fraction: 80%. LV Wall Motion: NL LV Function; NL Wall Motion  EKG:  EKG is not ordered today. The ekg ordered today demonstrates   Recent Labs: 10/08/2014: ALT 17; BUN 19; Creatinine, Ser 0.87; Hemoglobin 14.1; Platelets 248; Potassium 4.1; Sodium 137   Lipid Panel No results found for: CHOL, TRIG, HDL, CHOLHDL, VLDL, LDLCALC, LDLDIRECT   Wt Readings from Last 3 Encounters:  02/12/15 189 lb 12.8 oz (86.093 kg)  10/16/14 188 lb (85.276 kg)  10/08/14 188 lb 8 oz (85.503 kg)     Other studies Reviewed: Additional studies/ records that were reviewed today include: . Review of the above records demonstrates:    Assessment and Plan:   1. CAD:  Stable. Stress myoview December 2015 with no ischemia. Will continue medical management with ASA, statin and beta blocker.   2. HTN: BP is controlled. Will continue current meds.   3. Hyperlipidemia: Continue statin. Well controlled. Labs are updated in primary care per pt  4. LBBB: Chronic  5. Carotid artery disease: Mild carotid artery disease by dopplers December 2015.  Will repeat every 2 years.   6. Chronic diastolic CHF: Stable. Continue Lasix  40 mg daily.   Current medicines are reviewed at length with the patient today.  The patient does not have concerns regarding medicines.  The following changes have been made:  no change  Labs/ tests ordered today include:  No orders of the defined types were placed in this encounter.    Disposition:   FU with me in 12  months  Signed, Lauree Chandler, MD 02/12/2015 12:41 PM    [address], [place], [place]  [address] Phone: [phone]; Fax: [phone]

## 2015-02-12 NOTE — Patient Instructions (Signed)
Medication Instructions:  Your physician recommends that you continue on your current medications as directed. Please refer to the Current Medication list given to you today.   Labwork: none  Testing/Procedures: none  Follow-Up: Your physician wants you to follow-up in:  12 months.  You will receive a reminder letter in the mail two months in advance. If you don't receive a letter, please call our office to schedule the follow-up appointment.        

## 2015-02-26 ENCOUNTER — Other Ambulatory Visit: Payer: Self-pay | Admitting: Cardiovascular Disease

## 2015-04-07 DIAGNOSIS — E038 Other specified hypothyroidism: Secondary | ICD-10-CM | POA: Diagnosis not present

## 2015-04-07 DIAGNOSIS — E119 Type 2 diabetes mellitus without complications: Secondary | ICD-10-CM | POA: Diagnosis not present

## 2015-04-07 DIAGNOSIS — E782 Mixed hyperlipidemia: Secondary | ICD-10-CM | POA: Diagnosis not present

## 2015-04-08 ENCOUNTER — Other Ambulatory Visit: Payer: Self-pay

## 2015-04-08 MED ORDER — LOSARTAN POTASSIUM 50 MG PO TABS: 50.0000 mg | ORAL_TABLET | Freq: Two times a day (BID) | ORAL | Status: DC

## 2015-04-09 DIAGNOSIS — K219 Gastro-esophageal reflux disease without esophagitis: Secondary | ICD-10-CM | POA: Diagnosis not present

## 2015-04-09 DIAGNOSIS — I2510 Atherosclerotic heart disease of native coronary artery without angina pectoris: Secondary | ICD-10-CM | POA: Diagnosis not present

## 2015-04-09 DIAGNOSIS — I10 Essential (primary) hypertension: Secondary | ICD-10-CM | POA: Diagnosis not present

## 2015-04-09 DIAGNOSIS — M810 Age-related osteoporosis without current pathological fracture: Secondary | ICD-10-CM | POA: Diagnosis not present

## 2015-04-09 DIAGNOSIS — E782 Mixed hyperlipidemia: Secondary | ICD-10-CM | POA: Diagnosis not present

## 2015-04-09 DIAGNOSIS — E119 Type 2 diabetes mellitus without complications: Secondary | ICD-10-CM | POA: Diagnosis not present

## 2015-04-09 DIAGNOSIS — E038 Other specified hypothyroidism: Secondary | ICD-10-CM | POA: Diagnosis not present

## 2015-04-13 ENCOUNTER — Other Ambulatory Visit: Payer: Self-pay | Admitting: *Deleted

## 2015-04-13 MED ORDER — POTASSIUM CHLORIDE CRYS ER 10 MEQ PO TBCR: 10.0000 meq | EXTENDED_RELEASE_TABLET | Freq: Every day | ORAL | Status: DC

## 2015-04-16 ENCOUNTER — Other Ambulatory Visit (HOSPITAL_BASED_OUTPATIENT_CLINIC_OR_DEPARTMENT_OTHER): Payer: Medicare Other

## 2015-04-16 DIAGNOSIS — C50512 Malignant neoplasm of lower-outer quadrant of left female breast: Secondary | ICD-10-CM

## 2015-04-16 DIAGNOSIS — Z853 Personal history of malignant neoplasm of breast: Secondary | ICD-10-CM | POA: Diagnosis present

## 2015-04-16 LAB — COMPREHENSIVE METABOLIC PANEL (CC13)
ALBUMIN: 3.8 g/dL (ref 3.5–5.0)
ALT: 18 U/L (ref 0–55)
AST: 17 U/L (ref 5–34)
Alkaline Phosphatase: 88 U/L (ref 40–150)
Anion Gap: 6 mEq/L (ref 3–11)
BUN: 23.2 mg/dL (ref 7.0–26.0)
CALCIUM: 9.8 mg/dL (ref 8.4–10.4)
CHLORIDE: 108 meq/L (ref 98–109)
CO2: 26 meq/L (ref 22–29)
Creatinine: 0.9 mg/dL (ref 0.6–1.1)
EGFR: 61 mL/min/{1.73_m2} — ABNORMAL LOW (ref >90)
Glucose: 129 mg/dl (ref 70–140)
POTASSIUM: 4.1 meq/L (ref 3.5–5.1)
Sodium: 140 mEq/L (ref 136–145)
Total Bilirubin: 0.95 mg/dL (ref 0.20–1.20)
Total Protein: 6.7 g/dL (ref 6.4–8.3)

## 2015-04-16 LAB — CBC WITH DIFFERENTIAL/PLATELET
BASO%: 0.3 % (ref 0.0–2.0)
BASOS ABS: 0 10*3/uL (ref 0.0–0.1)
EOS%: 2.1 % (ref 0.0–7.0)
Eosinophils Absolute: 0.1 10*3/uL (ref 0.0–0.5)
HCT: 41.7 % (ref 34.8–46.6)
HGB: 13.7 g/dL (ref 11.6–15.9)
LYMPH%: 28.8 % (ref 14.0–49.7)
MCH: 29.2 pg (ref 25.1–34.0)
MCHC: 32.9 g/dL (ref 31.5–36.0)
MCV: 88.9 fL (ref 79.5–101.0)
MONO#: 0.5 10*3/uL (ref 0.1–0.9)
MONO%: 7.4 % (ref 0.0–14.0)
NEUT#: 3.8 10*3/uL (ref 1.5–6.5)
NEUT%: 61.4 % (ref 38.4–76.8)
Platelets: 227 10*3/uL (ref 145–400)
RBC: 4.69 10*6/uL (ref 3.70–5.45)
RDW: 14.5 % (ref 11.2–14.5)
WBC: 6.1 10*3/uL (ref 3.9–10.3)
lymph#: 1.8 10*3/uL (ref 0.9–3.3)

## 2015-04-23 ENCOUNTER — Ambulatory Visit (HOSPITAL_BASED_OUTPATIENT_CLINIC_OR_DEPARTMENT_OTHER): Payer: Medicare Other | Admitting: Oncology

## 2015-04-23 ENCOUNTER — Telehealth: Payer: Self-pay | Admitting: Oncology

## 2015-04-23 VITALS — BP 133/44 | HR 52 | Temp 98.4°F | Resp 18 | Ht 63.0 in | Wt 190.9 lb

## 2015-04-23 DIAGNOSIS — I25810 Atherosclerosis of coronary artery bypass graft(s) without angina pectoris: Secondary | ICD-10-CM | POA: Diagnosis not present

## 2015-04-23 DIAGNOSIS — M810 Age-related osteoporosis without current pathological fracture: Secondary | ICD-10-CM | POA: Diagnosis not present

## 2015-04-23 DIAGNOSIS — C50912 Malignant neoplasm of unspecified site of left female breast: Secondary | ICD-10-CM | POA: Diagnosis not present

## 2015-04-23 MED ORDER — ANASTROZOLE 1 MG PO TABS: 1.0000 mg | ORAL_TABLET | Freq: Every day | ORAL | Status: DC

## 2015-04-23 NOTE — Progress Notes (Signed)
ID: [name]   DOB: [birth year]  MR#: [id number]  PRF#:[id number]  PCP: Katherine Patricia, MD GYN: SU:  OTHER MD: [name]  CHIEF COMPLAINT:  Left breast cancer  CURRENT THERAPY: anastrozole 58m daily   BREAST CANCER HISTORY: The patient was noted to have left breast calcifications which have been followed on a every 6 month basis, most recently 10/11/2011. Bilateral diagnostic mammography on that date showed a mass in the medial left breast and a separate area of linear calcifications. The right breast was unremarkable. Ultrasound showed a simple cysts correlating with the mass seen on mammography. The area of abnormal calcifications in the lower outer quadrant was biopsied 10/20/2011 and showed ([id number]) and invasive ductal carcinoma, which appeared low to intermediate grade, and which was estrogen receptor positive at 98%, progesterone receptor positive at 54%, with no HER-2 amplification, and with an MIB-1 of 47%  On 10/27/2011 the patient underwent bilateral breast MRIs. This showed post biopsy change but also a 3 mm focus of enhancement in the lateral aspect of the biopsy cavity. There was no MRI evidence for multifocal, or contralateral malignancy or for lymph node spread. Accordingly, on 11/21/2011 the patient underwent left lumpectomy and sentinel lymph node sampling under Dr. TGeorgette [name] This showed ([id number]) high-grade invasive ductal carcinoma, measuring 1.1 cm, with 0 of 2 lymph nodes negative, and ample margins. Repeat HER-2 testing again showed no amplification.  Her subsequent history is as detailed below.  INTERVAL HISTORY:  LToddreturns for routine follow up of her left breast cancer. The interval history is generally unremarkable. She has been on anastrozole now for 3 years. She obtain said for $10/three-month supply. She has no significant prongs with hot flashes. Vaginal dryness is not an issue. She does not have the arthralgias or myalgias that  many patients can experience on this medication. She also receives Zometa every September. She has no side effects from that. Her teeth are in excellent repair and no extractions are planned   REVIEW OF SYSTEMS: Since the last visit here she had right rotator cuff repair under [name] area she is rJordanthat well. She is having a little bit of a dry cough at present. She attributes this to allergies which she gets this time of year. Her soreness is and diabetes are well-controlled. She exercises by going to the pool almost every afternoon. A detailed review of systems today was otherwise stable  PAST MEDICAL HISTORY: Past Medical History  Diagnosis Date  . Diverticulitis of colon with perforation   . S/P CABG x 2 06/2011    Dr. GServando [name]([name], [name]  . Carotid stenosis     dopplers 193/57 LICA 401-77% . DM2 (diabetes mellitus, type 2)   . HTN (hypertension)   . HLD (hyperlipidemia)   . Hypothyroidism   . RLS (restless legs syndrome)   . Breast cancer, left breast 11/01/2011  . CAD (coronary artery disease)     LHC 06/14/11: LAD 80% and 80% after Dx, Dx 80% (Dx was small), oOM 80%, mOM 70% (OM was small), oRCA 70-80%, mRCA 60-70%, EF 55% - referred for CABG;   Echo 06/14/11: mild focal basal septal hypertrophy, grade 1 diast dysfxn, mild LAE, mild RVE, PASP 37.    . Kidney stones   . GERD (gastroesophageal reflux disease)   . Psoriasis   . Diabetes mellitus     x 5-6 yrs  . Breast cancer 10/20/11    L breast, ER/PR+, HER2 -  .  S/P radiation therapy 12/20/11 - 01/10/12    Left Breast: 4256 cGy/ 16 Fractions  . Bronchitis     hx of  . History of kidney stones   . Arthritis     PAST SURGICAL HISTORY: Past Surgical History  Procedure Laterality Date  . Colon resection  1977  . Arthroscopy right knee    . Off-pump coronary artery bypass grafting x2  06/16/2011  . Vein bypass surgery  1999  . Colon surgery      part colectomy-  . Appendectomy    . Breast biospy  11/04/11     Breast, Left, Needle Core Biopsy 6 Oclock, Benign Breast Tissue wtih Fibrocystic  Changes: Microcalcifications Identified, No Neoplasm or Malignancy Identified  . Breast biospy  10/20/11    Breast, Left, Needle Core Biopsy LOQ: Invsive ductal Carcinoman  . Abdominal hysterectomy      age 20's  . Breast lumpectomy Left 11/22/11    Breast, Lumpectomy, Left with Sentinel Node Biopsy: Invasive High Grade Ductal Carcinoma : No lymphovascular Invasion: High  Grade Ductal Carcinoma In-Situ with Calcification and Comedo Necrosis  . Coronary artery bypass graft  10/12  . Cardiac catheterization  2012  . Colonoscopy    . Shoulder arthroscopy with subacromial decompression, rotator cuff repair and bicep tendon repair Right 10/16/2014    Procedure: RIGHT SHOULDER ARTHROSCOPY WITH SUBACROMIAL DECOMPRESSION, DISTAL CLAVICLE RESECTION AND ROTATOR CUFF REPAIR ;  Surgeon: Katherine Shutter, MD;  Location: [facility];  Service: Orthopedics;  Laterality: Right;    FAMILY HISTORY Family History  Problem Relation Age of Onset  . Cancer Mother     throat  . Heart disease Mother   . Hypertension Brother   . Cancer Paternal Uncle   . Heart disease Father    the patient's father died at the age of 58 from a subarachnoid hemorrhage. The patient's mother died at the age of 16 from congestive heart failure. She had a history of throat cancer. The patient has one brother, currently 51 years old. She had no sisters. There is no history of breast or ovarian cancer in the family  GYNECOLOGIC HISTORY: She had menarche age 66. She is GX P2, first pregnancy to term age 61. She underwent hysterectomy around age 58 to do to menorrhagia. 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 Her husband of 58+ years, [name] (goes by [name]) used to work for [employer]. Their son [name] lives in [place] and works for [employer]. The patient's other  child was stillborn. She has no grandchildren. She belongs to the [employer] and wants me to know that on Saturdays starting in May from 8 AM to 2:30 PM they have a hot dog sale that is considered the best in [place] (actually featured in the [place] this month)  ADVANCED DIRECTIVES: In place  HEALTH MAINTENANCE: Social History  Substance Use Topics  . Smoking status: Former Smoker -- 1.00 packs/day for 6 years    Types: Cigarettes    Quit date: 09/05/1980  . Smokeless tobacco: Not on file  . Alcohol Use: Yes     Comment: socially     Colonoscopy: "about 2007, no need to repeat"  PAP: s/p hysterectomy  Bone density: At the breast center 09/12/2008, showing significant osteopenia  Lipid panel: Under treatment  No Known Allergies  Current Outpatient Prescriptions  Medication  Sig Dispense Refill  . anastrozole (ARIMIDEX) 1 MG tablet TAKE 1 TABLET DAILY 90 tablet 2  . aspirin 81 MG tablet Take 81 mg by mouth daily.      . Biotin 5 MG CAPS Take 1 capsule by mouth daily.    Marland Kitchen CALCITRIOL PO Take 2 capsules by mouth daily.    . Calcium Carbonate (OS-CAL PO) Take 1 tablet by mouth daily.    Marland Kitchen CALCIUM PO Take 2 capsules by mouth at bedtime.    . Canagliflozin (INVOKANA) 300 MG TABS Take 1 tablet by mouth every morning.     . cetirizine (ZYRTEC) 10 MG tablet Take 10 mg by mouth daily as needed (allergies).     . Cholecalciferol (VITAMIN D-3) 5000 UNITS TABS Take 1 tablet by mouth daily.    . CRESTOR 20 MG tablet Take 20 mg by mouth daily.     . diazepam (VALIUM) 5 MG tablet Take 0.5-1 tablets (2.5-5 mg total) by mouth every 6 (six) hours as needed for muscle spasms or sedation. 40 tablet 1  . esomeprazole (NEXIUM) 40 MG capsule Take 40 mg by mouth daily before breakfast.      . ezetimibe (ZETIA) 10 MG tablet Take 10 mg by mouth daily.      . furosemide (LASIX) 40 MG tablet TAKE 1 TABLET DAILY 90 tablet 3  . ibuprofen (ADVIL,MOTRIN) 200 MG tablet Take  400 mg by mouth every 6 (six) hours as needed for moderate pain (shoulder pain).    Marland Kitchen levothyroxine (SYNTHROID, LEVOTHROID) 137 MCG tablet Take 137 mcg by mouth daily before breakfast.    . losartan (COZAAR) 50 MG tablet Take 1 tablet (50 mg total) by mouth 2 (two) times daily. 180 tablet 3  . metoprolol (LOPRESSOR) 50 MG tablet TAKE 1 AND 1/2 TABLETS     TWICE DAILY 270 tablet 0  . Multiple Vitamin (MULTIVITAMIN) capsule Take 1 capsule by mouth daily.      . Multiple Vitamins-Minerals (EYE VITAMINS) CAPS Take 1 capsule by mouth daily.     . ondansetron (ZOFRAN) 4 MG tablet Take 1 tablet (4 mg total) by mouth every 8 (eight) hours as needed for nausea or vomiting. 20 tablet 0  . pioglitazone (ACTOS) 15 MG tablet Take 15 mg by mouth daily.    . potassium chloride (KLOR-CON M10) 10 MEQ tablet Take 1 tablet (10 mEq total) by mouth daily. 90 tablet 3  . rOPINIRole (REQUIP) 3 MG tablet Take 3 mg by mouth at bedtime.      No current facility-administered medications for this visit.    OBJECTIVE: Middle-aged white woman in no acute distress Filed Vitals:   04/23/15 1035  BP: 133/44  Pulse: 52  Temp: 98.4 F (36.9 C)  Resp: 18     Body mass index is 33.83 kg/(m^2).    ECOG FS: 0 Filed Weights   04/23/15 1035  Weight: 190 lb 14.4 oz (86.592 kg)   Sclerae unicteric, EOMs intact Oropharynx clear, dentition in good repair No cervical or supraclavicular adenopathy Lungs no rales or rhonchi Heart regular rate and rhythm Abd soft, nontender, positive bowel sounds MSK no focal spinal tenderness, no upper extremity lymphedema Neuro: nonfocal, well oriented, appropriate affect Breasts:  The right breast is unremarkable per the left breast status post lumpectomy and radiation. There is no evidence of local recurrence.   LAB RESULTS: Lab Results  Component Value Date   WBC 6.1 04/16/2015   NEUTROABS 3.8 04/16/2015   HGB 13.7 04/16/2015  HCT 41.7 04/16/2015   MCV 88.9 04/16/2015   PLT 227  04/16/2015      Chemistry      Component Value Date/Time   NA 140 04/16/2015 0958   NA 137 10/08/2014 1128   K 4.1 04/16/2015 0958   K 4.1 10/08/2014 1128   CL 103 10/08/2014 1128   CL 105 10/17/2012 0912   CO2 26 04/16/2015 0958   CO2 27 10/08/2014 1128   BUN 23.2 04/16/2015 0958   BUN 19 10/08/2014 1128   CREATININE 0.9 04/16/2015 0958   CREATININE 0.87 10/08/2014 1128      Component Value Date/Time   CALCIUM 9.8 04/16/2015 0958   CALCIUM 9.8 10/08/2014 1128   ALKPHOS 88 04/16/2015 0958   ALKPHOS 72 10/08/2014 1128   AST 17 04/16/2015 0958   AST 22 10/08/2014 1128   ALT 18 04/16/2015 0958   ALT 17 10/08/2014 1128   BILITOT 0.95 04/16/2015 0958   BILITOT 0.9 10/08/2014 1128       Lab Results  Component Value Date   LABCA2 14 10/17/2012     STUDIES: EXAM: DIGITAL DIAGNOSTIC BILATERAL MAMMOGRAM WITH 3D TOMOSYNTHESIS AND CAD  COMPARISON: [facility]  ACR Breast Density Category c: The breast tissue is heterogeneously dense, which may obscure small masses.  FINDINGS: Stable postlumpectomy changes left breast. No concerning masses, calcifications or nonsurgical architectural distortion identified within either breast.  Mammographic images were processed with CAD.  IMPRESSION: No mammographic evidence for malignancy.  Stable postlumpectomy changes left breast.  RECOMMENDATION: Bilateral diagnostic mammography in 1 year.  I have discussed the findings and recommendations with the patient. Results were also provided in writing at the conclusion of the visit. If applicable, a reminder letter will be sent to the patient regarding the next appointment.  BI-RADS CATEGORY 2: Benign.   Electronically Signed  By: Lovey Newcomer M.D.  On: 12/17/2014 10:09   ASSESSMENT: 81 y.o.  [place] woman,   (1)  status post left lumpectomy and sentinel lymph node sampling 11/21/2011 for a T1c N0, stage IA invasive ductal carcinoma, grade 3, strongly  estrogen and progesterone receptor positive, with an MIB-1 of 47%, and no HER-2 amplification.   (2)  Completed radiation 01/10/2012 and started anastrozole June 2013  (3)  osteoporosis, receiving zoledronic acid annually, first dose given on 05/24/2012.  PLAN:  Monita continues to tolerate the anastrozole well. The one concern of courses her osteoporosis but she is receiving zolendronate yearly. This will be her fourth dose, when she receives that in September. She will be due for repeat bone density August 2017.  We are going to continue to see her yearly for the next 2 years, at which time she will "graduate" from follow-up here  She knows to call for any problems that may develop before her next visit here.    MAGRINAT,GUSTAV C    04/23/2015

## 2015-04-23 NOTE — Telephone Encounter (Signed)
Gave avs & calendar for September & August 2017.

## 2015-04-25 DIAGNOSIS — J0410 Acute tracheitis without obstruction: Secondary | ICD-10-CM | POA: Diagnosis not present

## 2015-05-13 ENCOUNTER — Ambulatory Visit (HOSPITAL_COMMUNITY)
Admission: RE | Admit: 2015-05-13 | Discharge: 2015-05-13 | Disposition: A | Discharge status: Home / Self Care | Payer: Medicare Other | Source: Ambulatory Visit | Attending: Nurse Practitioner | Admitting: Nurse Practitioner

## 2015-05-13 ENCOUNTER — Other Ambulatory Visit: Payer: Self-pay | Admitting: Nurse Practitioner

## 2015-05-13 ENCOUNTER — Other Ambulatory Visit (HOSPITAL_BASED_OUTPATIENT_CLINIC_OR_DEPARTMENT_OTHER): Payer: Medicare Other

## 2015-05-13 ENCOUNTER — Ambulatory Visit (HOSPITAL_BASED_OUTPATIENT_CLINIC_OR_DEPARTMENT_OTHER): Payer: Medicare Other | Admitting: Nurse Practitioner

## 2015-05-13 ENCOUNTER — Encounter: Payer: Self-pay | Admitting: Nurse Practitioner

## 2015-05-13 ENCOUNTER — Other Ambulatory Visit: Payer: Self-pay | Admitting: Oncology

## 2015-05-13 ENCOUNTER — Ambulatory Visit (HOSPITAL_BASED_OUTPATIENT_CLINIC_OR_DEPARTMENT_OTHER): Payer: Medicare Other

## 2015-05-13 ENCOUNTER — Telehealth: Payer: Self-pay

## 2015-05-13 VITALS — BP 156/42 | HR 55 | Temp 98.1°F | Resp 18

## 2015-05-13 DIAGNOSIS — I25810 Atherosclerosis of coronary artery bypass graft(s) without angina pectoris: Secondary | ICD-10-CM

## 2015-05-13 DIAGNOSIS — C50919 Malignant neoplasm of unspecified site of unspecified female breast: Secondary | ICD-10-CM

## 2015-05-13 DIAGNOSIS — C50912 Malignant neoplasm of unspecified site of left female breast: Secondary | ICD-10-CM

## 2015-05-13 DIAGNOSIS — Z87891 Personal history of nicotine dependence: Secondary | ICD-10-CM | POA: Diagnosis not present

## 2015-05-13 DIAGNOSIS — I517 Cardiomegaly: Secondary | ICD-10-CM | POA: Insufficient documentation

## 2015-05-13 DIAGNOSIS — Z853 Personal history of malignant neoplasm of breast: Secondary | ICD-10-CM

## 2015-05-13 DIAGNOSIS — M810 Age-related osteoporosis without current pathological fracture: Secondary | ICD-10-CM

## 2015-05-13 DIAGNOSIS — J40 Bronchitis, not specified as acute or chronic: Secondary | ICD-10-CM

## 2015-05-13 DIAGNOSIS — R05 Cough: Secondary | ICD-10-CM | POA: Diagnosis not present

## 2015-05-13 LAB — CBC WITH DIFFERENTIAL/PLATELET
BASO%: 0.7 % (ref 0.0–2.0)
Basophils Absolute: 0 10*3/uL (ref 0.0–0.1)
EOS ABS: 0.1 10*3/uL (ref 0.0–0.5)
EOS%: 2.3 % (ref 0.0–7.0)
HCT: 44 % (ref 34.8–46.6)
HEMOGLOBIN: 14.5 g/dL (ref 11.6–15.9)
LYMPH%: 26.8 % (ref 14.0–49.7)
MCH: 29.3 pg (ref 25.1–34.0)
MCHC: 33 g/dL (ref 31.5–36.0)
MCV: 88.5 fL (ref 79.5–101.0)
MONO#: 0.4 10*3/uL (ref 0.1–0.9)
MONO%: 6.3 % (ref 0.0–14.0)
NEUT#: 4.1 10*3/uL (ref 1.5–6.5)
NEUT%: 63.9 % (ref 38.4–76.8)
PLATELETS: 222 10*3/uL (ref 145–400)
RBC: 4.97 10*6/uL (ref 3.70–5.45)
RDW: 14.4 % (ref 11.2–14.5)
WBC: 6.4 10*3/uL (ref 3.9–10.3)
lymph#: 1.7 10*3/uL (ref 0.9–3.3)

## 2015-05-13 LAB — COMPREHENSIVE METABOLIC PANEL (CC13)
ALK PHOS: 87 U/L (ref 40–150)
ALT: 15 U/L (ref 0–55)
ANION GAP: 8 meq/L (ref 3–11)
AST: 16 U/L (ref 5–34)
Albumin: 3.8 g/dL (ref 3.5–5.0)
BILIRUBIN TOTAL: 0.93 mg/dL (ref 0.20–1.20)
BUN: 20.8 mg/dL (ref 7.0–26.0)
CALCIUM: 9.9 mg/dL (ref 8.4–10.4)
CO2: 25 meq/L (ref 22–29)
Chloride: 109 mEq/L (ref 98–109)
Creatinine: 0.9 mg/dL (ref 0.6–1.1)
EGFR: 60 mL/min/{1.73_m2} — AB (ref >90)
Glucose: 126 mg/dl (ref 70–140)
Potassium: 3.8 mEq/L (ref 3.5–5.1)
Sodium: 142 mEq/L (ref 136–145)
TOTAL PROTEIN: 6.9 g/dL (ref 6.4–8.3)

## 2015-05-13 MED ORDER — SODIUM CHLORIDE 0.9 % IV SOLN
Freq: Once | INTRAVENOUS | Status: AC
  Administered 2015-05-13: 10:00:00 via INTRAVENOUS

## 2015-05-13 MED ORDER — ZOLEDRONIC ACID 4 MG/100ML IV SOLN
4.0000 mg | Freq: Once | INTRAVENOUS | Status: AC
  Administered 2015-05-13: 4 mg via INTRAVENOUS
  Filled 2015-05-13: qty 100

## 2015-05-13 MED ORDER — ALBUTEROL SULFATE HFA 108 (90 BASE) MCG/ACT IN AERS: 1.0000 | INHALATION_SPRAY | Freq: Four times a day (QID) | RESPIRATORY_TRACT | Status: DC | PRN

## 2015-05-13 MED ORDER — ALBUTEROL SULFATE (2.5 MG/3ML) 0.083% IN NEBU
INHALATION_SOLUTION | RESPIRATORY_TRACT | Status: AC
  Filled 2015-05-13: qty 3

## 2015-05-13 MED ORDER — ALBUTEROL SULFATE (2.5 MG/3ML) 0.083% IN NEBU
2.5000 mg | INHALATION_SOLUTION | Freq: Once | RESPIRATORY_TRACT | Status: AC
  Administered 2015-05-13: 2.5 mg via RESPIRATORY_TRACT
  Filled 2015-05-13: qty 3

## 2015-05-13 MED ORDER — LEVOFLOXACIN 500 MG PO TABS: 500.0000 mg | ORAL_TABLET | Freq: Every day | ORAL | Status: DC

## 2015-05-13 NOTE — Progress Notes (Signed)
SYMPTOM MANAGEMENT CLINIC   HPI: [name] 81 y.o. female diagnosed with breast cancer.  Patient is status post left breast lumpectomy, radiation therapy completed in 2013.  Currently undergoing anastrozole.  Oral therapy and Zometa on a yearly basis.  Patient reports approximately a 3 week history of progressive cough.  She was treated approximately 2 weeks ago with a course of Zithromax antibiotics.  She is now reporting progressive productive cough with yellow secretions.  She also feels slightly short of breath with any type of exertion whatsoever.  She denies any recent chest pain, chest pressure, or pain with inspiration.  She also denies any recent fevers or chills.  HPI  ROS  Past Medical History  Diagnosis Date  . Diverticulitis of colon with perforation   . S/P CABG x 2 06/2011    Dr. Servando Snare (L-LAD, [facility])  . Carotid stenosis     dopplers 75/17: LICA 00-17%  . DM2 (diabetes mellitus, type 2)   . HTN (hypertension)   . HLD (hyperlipidemia)   . Hypothyroidism   . RLS (restless legs syndrome)   . Breast cancer, left breast 11/01/2011  . CAD (coronary artery disease)     LHC 06/14/11: LAD 80% and 80% after Dx, Dx 80% (Dx was small), oOM 80%, mOM 70% (OM was small), oRCA 70-80%, mRCA 60-70%, EF 55% - referred for CABG;   Echo 06/14/11: mild focal basal septal hypertrophy, grade 1 diast dysfxn, mild LAE, mild RVE, PASP 37.    . Kidney stones   . GERD (gastroesophageal reflux disease)   . Psoriasis   . Diabetes mellitus     x 5-6 yrs  . Breast cancer 10/20/11    L breast, ER/PR+, HER2 -  . S/P radiation therapy 12/20/11 - 01/10/12    Left Breast: 4256 cGy/ 16 Fractions  . Bronchitis     hx of  . History of kidney stones   . Arthritis     Past Surgical History  Procedure Laterality Date  . Colon resection  1977  . Arthroscopy right knee    . Off-pump coronary artery bypass grafting x2  06/16/2011  . Vein bypass surgery  1999  . Colon surgery      part  colectomy-  . Appendectomy    . Breast biospy  11/04/11    Breast, Left, Needle Core Biopsy 6 Oclock, Benign Breast Tissue wtih Fibrocystic  Changes: Microcalcifications Identified, No Neoplasm or Malignancy Identified  . Breast biospy  10/20/11    Breast, Left, Needle Core Biopsy LOQ: Invsive ductal Carcinoman  . Abdominal hysterectomy      age 17's  . Breast lumpectomy Left 11/22/11    Breast, Lumpectomy, Left with Sentinel Node Biopsy: Invasive High Grade Ductal Carcinoma : No lymphovascular Invasion: High  Grade Ductal Carcinoma In-Situ with Calcification and Comedo Necrosis  . Coronary artery bypass graft  10/12  . Cardiac catheterization  2012  . Colonoscopy    . Shoulder arthroscopy with subacromial decompression, rotator cuff repair and bicep tendon repair Right 10/16/2014    Procedure: RIGHT SHOULDER ARTHROSCOPY WITH SUBACROMIAL DECOMPRESSION, DISTAL CLAVICLE RESECTION AND ROTATOR CUFF REPAIR ;  Surgeon: Marin Shutter, MD;  Location: [place];  Service: Orthopedics;  Laterality: Right;    has HYPERTENSION, BENIGN; LBBB; Kidney stones; Pregnancy toxemia; Diverticulitis of colon with perforation; CAD (coronary artery disease); HTN (hypertension); HLD (hyperlipidemia); Carotid stenosis; Edema; Breast cancer, left breast; Diabetes mellitus; Breast cancer; Malignant neoplasm of lower-outer quadrant of female breast; Osteoporosis; Vaginal  dryness; and Bronchitis on her problem list.    has No Known Allergies.    Medication List       This list is accurate as of: 05/13/15 11:00 AM.  Always use your most recent med list.               anastrozole 1 MG tablet  Commonly known as:  ARIMIDEX  Take 1 tablet (1 mg total) by mouth daily.     aspirin 81 MG tablet  Take 81 mg by mouth daily.     Biotin 5 MG Caps  Take 1 capsule by mouth daily.     CALCITRIOL PO  Take 2 capsules by mouth daily.     CALCIUM PO  Take 2 capsules by mouth at bedtime.     cetirizine 10 MG tablet  Commonly  known as:  ZYRTEC  Take 10 mg by mouth daily as needed (allergies).     CRESTOR 20 MG tablet  Generic drug:  rosuvastatin  Take 20 mg by mouth daily.     diazepam 5 MG tablet  Commonly known as:  VALIUM  Take 0.5-1 tablets (2.5-5 mg total) by mouth every 6 (six) hours as needed for muscle spasms or sedation.     esomeprazole 40 MG capsule  Commonly known as:  NEXIUM  Take 40 mg by mouth daily before breakfast.     EYE VITAMINS Caps  Take 1 capsule by mouth daily.     ezetimibe 10 MG tablet  Commonly known as:  ZETIA  Take 10 mg by mouth daily.     furosemide 40 MG tablet  Commonly known as:  LASIX  TAKE 1 TABLET DAILY     ibuprofen 200 MG tablet  Commonly known as:  ADVIL,MOTRIN  Take 400 mg by mouth every 6 (six) hours as needed for moderate pain (shoulder pain).     INVOKANA 300 MG Tabs tablet  Generic drug:  canagliflozin  Take 1 tablet by mouth every morning.     levothyroxine 137 MCG tablet  Commonly known as:  SYNTHROID, LEVOTHROID  Take 137 mcg by mouth daily before breakfast.     losartan 50 MG tablet  Commonly known as:  COZAAR  Take 1 tablet (50 mg total) by mouth 2 (two) times daily.     metoprolol 50 MG tablet  Commonly known as:  LOPRESSOR  TAKE 1 AND 1/2 TABLETS     TWICE DAILY     multivitamin capsule  Take 1 capsule by mouth daily.     ondansetron 4 MG tablet  Commonly known as:  ZOFRAN  Take 1 tablet (4 mg total) by mouth every 8 (eight) hours as needed for nausea or vomiting.     OS-CAL PO  Take 1 tablet by mouth daily.     pioglitazone 15 MG tablet  Commonly known as:  ACTOS  Take 15 mg by mouth daily.     potassium chloride 10 MEQ tablet  Commonly known as:  KLOR-CON M10  Take 1 tablet (10 mEq total) by mouth daily.     rOPINIRole 3 MG tablet  Commonly known as:  REQUIP  Take 3 mg by mouth at bedtime.     Vitamin D-3 5000 UNITS Tabs  Take 1 tablet by mouth daily.         PHYSICAL EXAMINATION  Oncology Vitals 05/13/2015  04/23/2015 02/12/2015 10/16/2014 10/16/2014 10/16/2014 10/16/2014  Height - 160 cm 160 cm - - - -  Weight - 86.592 kg  86.093 kg - - - -  Weight (lbs) - 190 lbs 14 oz 189 lbs 13 oz - - - -  BMI (kg/m2) - 33.82 kg/m2 33.62 kg/m2 - - - -  Temp 98.1 98.4 - - - - -  Pulse 55 52 64 48 51 50 -  Resp 18 18 - - - 15 18  Resp (Historical as of 04/05/12) - - - - - - -  SpO2 99 97 - 93 93 91 -  BSA (m2) - 1.96 m2 1.96 m2 - - - -   BP Readings from Last 3 Encounters:  05/13/15 156/42  04/23/15 133/44  02/12/15 110/50    Physical Exam  Constitutional: She is oriented to person, place, and time and well-developed, well-nourished, and in no distress.  HENT:  Head: Normocephalic and atraumatic.  Mouth/Throat: Oropharynx is clear and moist.  Eyes: Conjunctivae and EOM are normal. Pupils are equal, round, and reactive to light. Right eye exhibits no discharge. No scleral icterus.  Neck: Normal range of motion. Neck supple. No JVD present. No tracheal deviation present. No thyromegaly present.  Cardiovascular: Normal rate, regular rhythm, normal heart sounds and intact distal pulses.   Pulmonary/Chest: Effort normal. No stridor. No respiratory distress. She has wheezes. She has rales. She exhibits no tenderness.  On exam.  Patient with diminished breath sounds to bases bilaterally.  No wheezing noted on initial check.  Patient does not appear short of breath.  Patient is noted to have a congested cough on exam.  Patient was given an albuterol nebulizer treatment while in the infusion area.  It does appear that patient has increased coughing and has now also has some wheezing post nebulizer treatment.     Abdominal: Soft. Bowel sounds are normal. She exhibits no distension and no mass. There is no tenderness. There is no rebound and no guarding.  Musculoskeletal: Normal range of motion. She exhibits no edema or tenderness.  Lymphadenopathy:    She has no cervical adenopathy.  Neurological: She is alert and  oriented to person, place, and time. Gait normal.  Skin: Skin is warm and dry. No rash noted. No erythema. No pallor.  Psychiatric: Affect normal.  Nursing note and vitals reviewed.   LABORATORY DATA:. Appointment on 05/13/2015  Component Date Value Ref Range Status  . WBC 05/13/2015 6.4  3.9 - 10.3 10e3/uL Final  . NEUT# 05/13/2015 4.1  1.5 - 6.5 10e3/uL Final  . HGB 05/13/2015 14.5  11.6 - 15.9 g/dL Final  . HCT 05/13/2015 44.0  34.8 - 46.6 % Final  . Platelets 05/13/2015 222  145 - 400 10e3/uL Final  . MCV 05/13/2015 88.5  79.5 - 101.0 fL Final  . MCH 05/13/2015 29.3  25.1 - 34.0 pg Final  . MCHC 05/13/2015 33.0  31.5 - 36.0 g/dL Final  . RBC 05/13/2015 4.97  3.70 - 5.45 10e6/uL Final  . RDW 05/13/2015 14.4  11.2 - 14.5 % Final  . lymph# 05/13/2015 1.7  0.9 - 3.3 10e3/uL Final  . MONO# 05/13/2015 0.4  0.1 - 0.9 10e3/uL Final  . Eosinophils Absolute 05/13/2015 0.1  0.0 - 0.5 10e3/uL Final  . Basophils Absolute 05/13/2015 0.0  0.0 - 0.1 10e3/uL Final  . NEUT% 05/13/2015 63.9  38.4 - 76.8 % Final  . LYMPH% 05/13/2015 26.8  14.0 - 49.7 % Final  . MONO% 05/13/2015 6.3  0.0 - 14.0 % Final  . EOS% 05/13/2015 2.3  0.0 - 7.0 % Final  . BASO% 05/13/2015 0.7  0.0 - 2.0 % Final  . Sodium 05/13/2015 142  136 - 145 mEq/L Final  . Potassium 05/13/2015 3.8  3.5 - 5.1 mEq/L Final  . Chloride 05/13/2015 109  98 - 109 mEq/L Final  . CO2 05/13/2015 25  22 - 29 mEq/L Final  . Glucose 05/13/2015 126  70 - 140 mg/dl Final  . BUN 05/13/2015 20.8  7.0 - 26.0 mg/dL Final  . Creatinine 05/13/2015 0.9  0.6 - 1.1 mg/dL Final  . Total Bilirubin 05/13/2015 0.93  0.20 - 1.20 mg/dL Final  . Alkaline Phosphatase 05/13/2015 87  40 - 150 U/L Final  . AST 05/13/2015 16  5 - 34 U/L Final  . ALT 05/13/2015 15  0 - 55 U/L Final  . Total Protein 05/13/2015 6.9  6.4 - 8.3 g/dL Final  . Albumin 05/13/2015 3.8  3.5 - 5.0 g/dL Final  . Calcium 05/13/2015 9.9  8.4 - 10.4 mg/dL Final  . Anion Gap 05/13/2015 8  3 - 11  mEq/L Final  . EGFR 05/13/2015 60* >90 ml/min/1.73 m2 Final   eGFR is calculated using the CKD-EPI Creatinine Equation (2009)     RADIOGRAPHIC STUDIES: No results found.  ASSESSMENT/PLAN:    Breast cancer, left breast Patient is status post left breast lumpectomy and radiation therapy completed in 2013.  Patient continues to take anastrozole on a daily basis.  Patient presents today to receive her yearly Zometa infusion.  Blood counts obtained today are within normal limits.  Patient has plansfor labs and follow-up visit on 04/14/2016.    Bronchitis Patient reports approximately a 3 week history of progressive cough.  She was treated approximately 2 weeks ago with a course of Zithromax antibiotics.  She is now reporting progressive productive cough with yellow secretions.  She also feels slightly short of breath with any type of exertion whatsoever.  She denies any recent chest pain, chest pressure, or pain with inspiration.  She also denies any recent fevers or chills.  On exam.  Patient with diminished breath sounds to bases bilaterally.  No wheezing noted on initial check.  Patient does not appear short of breath.  Patient is noted to have a congested cough on exam.  Patient was given an albuterol nebulizer treatment while in the infusion area.  It does appear that patient has increased coughing and has now also has some wheezing post nebulizer treatment.  Will obtain a chest x-ray for further evaluation after she finishes her Zometa infusion today.  The plan is to prescribe Levaquin antibiotics and an albuterol inhaler for treatment of probable bronchitis versus early pneumonia.  Advised patient would call and review all chest x-ray results with her later this afternoon.  Also, advised patient to call/return, go to her primary care physician, or go directly to the emergency department for any worsening symptoms whatsoever.  Patient stated understanding of all instructions; and was  in agreement with this plan of care. The patient knows to call the clinic with any problems, questions or concerns.   Review/collaboration with Dr. Jana Hakim regarding all aspects of patient's visit today.   Total time spent with patient was 25 minutes;  with greater than 75 percent of that time spent in face to face counseling regarding patient's symptoms,  and coordination of care and follow up.  Disclaimer:This dictation was prepared with Dragon/digital dictation along with [employer]. Any transcriptional errors that result from this process are unintentional.  Drue Second, NP 05/13/2015

## 2015-05-13 NOTE — Telephone Encounter (Signed)
lvm that CXR was normal. zometa added to Dr Magrinat's visit next year.

## 2015-05-13 NOTE — Progress Notes (Signed)
Pt states she has a productive Cough, with Yellow Flam, states that she saw urgent care and was on a Z-pac that ended approx 2 weeks ago and it has gotten better, About a week ago the cough returned. Dr. Jana Hakim aware and Selena Lesser NP over to infusion to assess. Albuterol treatment given per orders. Pt to have chest Xray after leaving infusion. Pt aware and verbalizes understanding. Report given to primary nurse.

## 2015-05-13 NOTE — Assessment & Plan Note (Signed)
Patient is status post left breast lumpectomy and radiation therapy completed in 2013.  Patient continues to take anastrozole on a daily basis.  Patient presents today to receive her yearly Zometa infusion.  Blood counts obtained today are within normal limits.  Patient has plansfor labs and follow-up visit on 04/14/2016.

## 2015-05-13 NOTE — Patient Instructions (Signed)

## 2015-05-13 NOTE — Assessment & Plan Note (Signed)
Patient reports approximately a 3 week history of progressive cough.  She was treated approximately 2 weeks ago with a course of Zithromax antibiotics.  She is now reporting progressive productive cough with yellow secretions.  She also feels slightly short of breath with any type of exertion whatsoever.  She denies any recent chest pain, chest pressure, or pain with inspiration.  She also denies any recent fevers or chills.  On exam.  Patient with diminished breath sounds to bases bilaterally.  No wheezing noted on initial check.  Patient does not appear short of breath.  Patient is noted to have a congested cough on exam.  Patient was given an albuterol nebulizer treatment while in the infusion area.  It does appear that patient has increased coughing and has now also has some wheezing post nebulizer treatment.  Will obtain a chest x-ray for further evaluation after she finishes her Zometa infusion today.  The plan is to prescribe Levaquin antibiotics and an albuterol inhaler for treatment of probable bronchitis versus early pneumonia.  Advised patient would call and review all chest x-ray results with her later this afternoon.  Also, advised patient to call/return, go to her primary care physician, or go directly to the emergency department for any worsening symptoms whatsoever.

## 2015-05-18 DIAGNOSIS — E038 Other specified hypothyroidism: Secondary | ICD-10-CM | POA: Diagnosis not present

## 2015-05-18 DIAGNOSIS — M810 Age-related osteoporosis without current pathological fracture: Secondary | ICD-10-CM | POA: Diagnosis not present

## 2015-05-18 DIAGNOSIS — I2510 Atherosclerotic heart disease of native coronary artery without angina pectoris: Secondary | ICD-10-CM | POA: Diagnosis not present

## 2015-05-18 DIAGNOSIS — K219 Gastro-esophageal reflux disease without esophagitis: Secondary | ICD-10-CM | POA: Diagnosis not present

## 2015-05-18 DIAGNOSIS — I10 Essential (primary) hypertension: Secondary | ICD-10-CM | POA: Diagnosis not present

## 2015-05-18 DIAGNOSIS — Z23 Encounter for immunization: Secondary | ICD-10-CM | POA: Diagnosis not present

## 2015-05-18 DIAGNOSIS — E782 Mixed hyperlipidemia: Secondary | ICD-10-CM | POA: Diagnosis not present

## 2015-05-18 DIAGNOSIS — E119 Type 2 diabetes mellitus without complications: Secondary | ICD-10-CM | POA: Diagnosis not present

## 2015-06-15 ENCOUNTER — Other Ambulatory Visit: Payer: Self-pay

## 2015-06-15 MED ORDER — METOPROLOL TARTRATE 50 MG PO TABS: ORAL_TABLET | ORAL | Status: DC

## 2015-06-29 DIAGNOSIS — H353131 Nonexudative age-related macular degeneration, bilateral, early dry stage: Secondary | ICD-10-CM | POA: Diagnosis not present

## 2015-06-29 DIAGNOSIS — H26493 Other secondary cataract, bilateral: Secondary | ICD-10-CM | POA: Diagnosis not present

## 2015-06-29 DIAGNOSIS — E119 Type 2 diabetes mellitus without complications: Secondary | ICD-10-CM | POA: Diagnosis not present

## 2015-06-29 DIAGNOSIS — Z961 Presence of intraocular lens: Secondary | ICD-10-CM | POA: Diagnosis not present

## 2015-07-10 DIAGNOSIS — E038 Other specified hypothyroidism: Secondary | ICD-10-CM | POA: Diagnosis not present

## 2015-07-10 DIAGNOSIS — E782 Mixed hyperlipidemia: Secondary | ICD-10-CM | POA: Diagnosis not present

## 2015-07-10 DIAGNOSIS — E119 Type 2 diabetes mellitus without complications: Secondary | ICD-10-CM | POA: Diagnosis not present

## 2015-07-14 DIAGNOSIS — K219 Gastro-esophageal reflux disease without esophagitis: Secondary | ICD-10-CM | POA: Diagnosis not present

## 2015-07-14 DIAGNOSIS — E119 Type 2 diabetes mellitus without complications: Secondary | ICD-10-CM | POA: Diagnosis not present

## 2015-07-14 DIAGNOSIS — M810 Age-related osteoporosis without current pathological fracture: Secondary | ICD-10-CM | POA: Diagnosis not present

## 2015-07-14 DIAGNOSIS — I10 Essential (primary) hypertension: Secondary | ICD-10-CM | POA: Diagnosis not present

## 2015-07-14 DIAGNOSIS — I2510 Atherosclerotic heart disease of native coronary artery without angina pectoris: Secondary | ICD-10-CM | POA: Diagnosis not present

## 2015-07-14 DIAGNOSIS — E038 Other specified hypothyroidism: Secondary | ICD-10-CM | POA: Diagnosis not present

## 2015-07-14 DIAGNOSIS — E782 Mixed hyperlipidemia: Secondary | ICD-10-CM | POA: Diagnosis not present

## 2015-07-14 DIAGNOSIS — Z23 Encounter for immunization: Secondary | ICD-10-CM | POA: Diagnosis not present

## 2015-07-15 DIAGNOSIS — H26491 Other secondary cataract, right eye: Secondary | ICD-10-CM | POA: Diagnosis not present

## 2015-07-15 DIAGNOSIS — H2640 Unspecified secondary cataract: Secondary | ICD-10-CM | POA: Diagnosis not present

## 2015-10-13 DIAGNOSIS — E038 Other specified hypothyroidism: Secondary | ICD-10-CM | POA: Diagnosis not present

## 2015-10-13 DIAGNOSIS — E119 Type 2 diabetes mellitus without complications: Secondary | ICD-10-CM | POA: Diagnosis not present

## 2015-10-13 DIAGNOSIS — E782 Mixed hyperlipidemia: Secondary | ICD-10-CM | POA: Diagnosis not present

## 2015-10-15 DIAGNOSIS — M810 Age-related osteoporosis without current pathological fracture: Secondary | ICD-10-CM | POA: Diagnosis not present

## 2015-10-15 DIAGNOSIS — I2510 Atherosclerotic heart disease of native coronary artery without angina pectoris: Secondary | ICD-10-CM | POA: Diagnosis not present

## 2015-10-15 DIAGNOSIS — I10 Essential (primary) hypertension: Secondary | ICD-10-CM | POA: Diagnosis not present

## 2015-10-15 DIAGNOSIS — E038 Other specified hypothyroidism: Secondary | ICD-10-CM | POA: Diagnosis not present

## 2015-10-15 DIAGNOSIS — E782 Mixed hyperlipidemia: Secondary | ICD-10-CM | POA: Diagnosis not present

## 2015-10-15 DIAGNOSIS — E119 Type 2 diabetes mellitus without complications: Secondary | ICD-10-CM | POA: Diagnosis not present

## 2015-10-15 DIAGNOSIS — K219 Gastro-esophageal reflux disease without esophagitis: Secondary | ICD-10-CM | POA: Diagnosis not present

## 2015-10-26 ENCOUNTER — Other Ambulatory Visit: Payer: Self-pay | Admitting: Surgery

## 2015-10-26 DIAGNOSIS — Z9889 Other specified postprocedural states: Secondary | ICD-10-CM

## 2015-11-28 ENCOUNTER — Other Ambulatory Visit: Payer: Self-pay | Admitting: Cardiovascular Disease

## 2015-12-04 DIAGNOSIS — R109 Unspecified abdominal pain: Secondary | ICD-10-CM | POA: Diagnosis not present

## 2015-12-04 DIAGNOSIS — R0789 Other chest pain: Secondary | ICD-10-CM | POA: Diagnosis not present

## 2015-12-04 DIAGNOSIS — R0781 Pleurodynia: Secondary | ICD-10-CM | POA: Diagnosis not present

## 2015-12-04 DIAGNOSIS — I2510 Atherosclerotic heart disease of native coronary artery without angina pectoris: Secondary | ICD-10-CM | POA: Diagnosis not present

## 2015-12-18 ENCOUNTER — Ambulatory Visit
Admission: RE | Admit: 2015-12-18 | Discharge: 2015-12-18 | Disposition: A | Discharge status: Home / Self Care | Payer: Medicare Other | Source: Ambulatory Visit | Attending: Surgery | Admitting: Surgery

## 2015-12-18 DIAGNOSIS — Z9889 Other specified postprocedural states: Secondary | ICD-10-CM

## 2015-12-18 DIAGNOSIS — R928 Other abnormal and inconclusive findings on diagnostic imaging of breast: Secondary | ICD-10-CM | POA: Diagnosis not present

## 2015-12-25 DIAGNOSIS — C50912 Malignant neoplasm of unspecified site of left female breast: Secondary | ICD-10-CM | POA: Diagnosis not present

## 2016-01-12 ENCOUNTER — Other Ambulatory Visit: Payer: Self-pay | Admitting: Nurse Practitioner

## 2016-01-12 DIAGNOSIS — E119 Type 2 diabetes mellitus without complications: Secondary | ICD-10-CM | POA: Diagnosis not present

## 2016-01-12 DIAGNOSIS — E038 Other specified hypothyroidism: Secondary | ICD-10-CM | POA: Diagnosis not present

## 2016-01-12 DIAGNOSIS — E782 Mixed hyperlipidemia: Secondary | ICD-10-CM | POA: Diagnosis not present

## 2016-01-14 DIAGNOSIS — K219 Gastro-esophageal reflux disease without esophagitis: Secondary | ICD-10-CM | POA: Diagnosis not present

## 2016-01-14 DIAGNOSIS — E782 Mixed hyperlipidemia: Secondary | ICD-10-CM | POA: Diagnosis not present

## 2016-01-14 DIAGNOSIS — E038 Other specified hypothyroidism: Secondary | ICD-10-CM | POA: Diagnosis not present

## 2016-01-14 DIAGNOSIS — I2510 Atherosclerotic heart disease of native coronary artery without angina pectoris: Secondary | ICD-10-CM | POA: Diagnosis not present

## 2016-01-14 DIAGNOSIS — E119 Type 2 diabetes mellitus without complications: Secondary | ICD-10-CM | POA: Diagnosis not present

## 2016-01-14 DIAGNOSIS — M810 Age-related osteoporosis without current pathological fracture: Secondary | ICD-10-CM | POA: Diagnosis not present

## 2016-01-14 DIAGNOSIS — I10 Essential (primary) hypertension: Secondary | ICD-10-CM | POA: Diagnosis not present

## 2016-02-12 ENCOUNTER — Other Ambulatory Visit: Payer: Self-pay | Admitting: *Deleted

## 2016-02-12 ENCOUNTER — Other Ambulatory Visit: Payer: Self-pay | Admitting: Cardiovascular Disease

## 2016-02-12 MED ORDER — FUROSEMIDE 40 MG PO TABS: 40.0000 mg | ORAL_TABLET | Freq: Every day | ORAL | Status: DC

## 2016-02-12 MED ORDER — METOPROLOL TARTRATE 50 MG PO TABS: ORAL_TABLET | ORAL | Status: DC

## 2016-03-08 ENCOUNTER — Other Ambulatory Visit: Payer: Self-pay | Admitting: Oncology

## 2016-03-22 ENCOUNTER — Other Ambulatory Visit: Payer: Self-pay | Admitting: Cardiovascular Disease

## 2016-03-29 DIAGNOSIS — E119 Type 2 diabetes mellitus without complications: Secondary | ICD-10-CM | POA: Diagnosis not present

## 2016-03-29 DIAGNOSIS — E782 Mixed hyperlipidemia: Secondary | ICD-10-CM | POA: Diagnosis not present

## 2016-03-29 DIAGNOSIS — E038 Other specified hypothyroidism: Secondary | ICD-10-CM | POA: Diagnosis not present

## 2016-04-01 DIAGNOSIS — E782 Mixed hyperlipidemia: Secondary | ICD-10-CM | POA: Diagnosis not present

## 2016-04-01 DIAGNOSIS — I2510 Atherosclerotic heart disease of native coronary artery without angina pectoris: Secondary | ICD-10-CM | POA: Diagnosis not present

## 2016-04-01 DIAGNOSIS — E038 Other specified hypothyroidism: Secondary | ICD-10-CM | POA: Diagnosis not present

## 2016-04-01 DIAGNOSIS — I10 Essential (primary) hypertension: Secondary | ICD-10-CM | POA: Diagnosis not present

## 2016-04-01 DIAGNOSIS — K219 Gastro-esophageal reflux disease without esophagitis: Secondary | ICD-10-CM | POA: Diagnosis not present

## 2016-04-01 DIAGNOSIS — M810 Age-related osteoporosis without current pathological fracture: Secondary | ICD-10-CM | POA: Diagnosis not present

## 2016-04-01 DIAGNOSIS — E119 Type 2 diabetes mellitus without complications: Secondary | ICD-10-CM | POA: Diagnosis not present

## 2016-04-14 ENCOUNTER — Other Ambulatory Visit (HOSPITAL_BASED_OUTPATIENT_CLINIC_OR_DEPARTMENT_OTHER): Payer: Medicare Other

## 2016-04-14 DIAGNOSIS — C50912 Malignant neoplasm of unspecified site of left female breast: Secondary | ICD-10-CM

## 2016-04-14 LAB — CBC WITH DIFFERENTIAL/PLATELET
BASO%: 0.5 % (ref 0.0–2.0)
BASOS ABS: 0 10*3/uL (ref 0.0–0.1)
EOS ABS: 0.2 10*3/uL (ref 0.0–0.5)
EOS%: 2.7 % (ref 0.0–7.0)
HCT: 40.9 % (ref 34.8–46.6)
HGB: 13.4 g/dL (ref 11.6–15.9)
LYMPH%: 27.4 % (ref 14.0–49.7)
MCH: 28.9 pg (ref 25.1–34.0)
MCHC: 32.8 g/dL (ref 31.5–36.0)
MCV: 88.1 fL (ref 79.5–101.0)
MONO#: 0.5 10*3/uL (ref 0.1–0.9)
MONO%: 8.2 % (ref 0.0–14.0)
NEUT%: 61.2 % (ref 38.4–76.8)
NEUTROS ABS: 4 10*3/uL (ref 1.5–6.5)
PLATELETS: 259 10*3/uL (ref 145–400)
RBC: 4.64 10*6/uL (ref 3.70–5.45)
RDW: 14.5 % (ref 11.2–14.5)
WBC: 6.6 10*3/uL (ref 3.9–10.3)
lymph#: 1.8 10*3/uL (ref 0.9–3.3)

## 2016-04-14 LAB — COMPREHENSIVE METABOLIC PANEL
ALT: 17 U/L (ref 0–55)
ANION GAP: 8 meq/L (ref 3–11)
AST: 18 U/L (ref 5–34)
Albumin: 3.3 g/dL — ABNORMAL LOW (ref 3.5–5.0)
Alkaline Phosphatase: 78 U/L (ref 40–150)
BILIRUBIN TOTAL: 0.59 mg/dL (ref 0.20–1.20)
BUN: 18.5 mg/dL (ref 7.0–26.0)
CO2: 25 mEq/L (ref 22–29)
Calcium: 9.8 mg/dL (ref 8.4–10.4)
Chloride: 107 mEq/L (ref 98–109)
Creatinine: 0.9 mg/dL (ref 0.6–1.1)
EGFR: 64 mL/min/{1.73_m2} — ABNORMAL LOW (ref >90)
Glucose: 112 mg/dl (ref 70–140)
Potassium: 4.2 mEq/L (ref 3.5–5.1)
SODIUM: 140 meq/L (ref 136–145)
TOTAL PROTEIN: 6.8 g/dL (ref 6.4–8.3)

## 2016-04-21 ENCOUNTER — Ambulatory Visit (HOSPITAL_BASED_OUTPATIENT_CLINIC_OR_DEPARTMENT_OTHER): Payer: Medicare Other | Admitting: Adult Health

## 2016-04-21 ENCOUNTER — Telehealth: Payer: Self-pay | Admitting: Adult Health

## 2016-04-21 ENCOUNTER — Encounter: Payer: Self-pay | Admitting: Adult Health

## 2016-04-21 VITALS — BP 132/64 | HR 58 | Temp 98.9°F | Resp 18 | Ht 63.0 in | Wt 192.9 lb

## 2016-04-21 DIAGNOSIS — M810 Age-related osteoporosis without current pathological fracture: Secondary | ICD-10-CM

## 2016-04-21 DIAGNOSIS — Z79811 Long term (current) use of aromatase inhibitors: Secondary | ICD-10-CM | POA: Diagnosis not present

## 2016-04-21 DIAGNOSIS — E119 Type 2 diabetes mellitus without complications: Secondary | ICD-10-CM | POA: Diagnosis not present

## 2016-04-21 DIAGNOSIS — C50912 Malignant neoplasm of unspecified site of left female breast: Secondary | ICD-10-CM

## 2016-04-21 NOTE — Telephone Encounter (Signed)
appt made and avs printed °

## 2016-04-21 NOTE — Progress Notes (Signed)
CLINIC:  Survivorship   REASON FOR VISIT:  Routine follow-up for history of breast cancer.   BRIEF ONCOLOGIC HISTORY:  (from last visit with Dr. Jana Hakim on 04/23/15)   INTERVAL HISTORY:  [name] presents to the [facility] today for routine follow-up for her history of breast cancer.  Overall, she reports feeling quite well. She denies any new breast complaints.  She is having some trouble with her right eye after laser surgery for cataracts; she will be seeing her eye doctor soon for this.  She also tells me that she has a "bad tooth" that is not causing her any pain, but is going to require oral surgery.  She was waiting to meet with [place] today before she made arrangements at her dentist for this procedure.    She still sees her cardiologist once per year and is doing well from that standpoint.  She has some dyspnea with exertion, "but I just take my time and I'm okay."  She takes Requip for restless leg syndrome, which is helpful.  Her diabetes is well-controlled currently.  She endorses occasional heartburn and a hernia; she dribbles urine; she has psoriasis.    She has been staying busy this summer at her church's hot dog sale on Saturdays.  She really enjoys being involved in her church. They have a new minister.    REVIEW OF SYSTEMS:  Review of Systems  Constitutional: Negative.   HENT: Negative.   Eyes: Positive for blurred vision.       (R) eye with blurred vision s/p cataract surgery; seeing ophthomologist   Respiratory: Positive for shortness of breath.        DOE  Cardiovascular: Positive for leg swelling.  Gastrointestinal: Positive for heartburn.  Genitourinary:       Dribbling urine  Musculoskeletal: Positive for joint pain.       Arthritis  Skin:       Psoriasis  Neurological:       Restless leg syndrome; takes Requip  Endo/Heme/Allergies:       Diabetes; well-controlled per pt   Psychiatric/Behavioral: Negative.   GU: Denies vaginal bleeding,  discharge, or dryness.  Breast: Denies any new nodularity, masses, tenderness, nipple changes, or nipple discharge.    A 14-point review of systems was completed and was negative, except as noted above.    PAST MEDICAL/SURGICAL HISTORY:  Past Medical History:  Diagnosis Date  . Arthritis   . Breast cancer 10/20/11   L breast, ER/PR+, HER2 -  . Breast cancer, left breast 11/01/2011  . Bronchitis    hx of  . CAD (coronary artery disease)    LHC 06/14/11: LAD 80% and 80% after Dx, Dx 80% (Dx was small), oOM 80%, mOM 70% (OM was small), oRCA 70-80%, mRCA 60-70%, EF 55% - referred for CABG;   Echo 06/14/11: mild focal basal septal hypertrophy, grade 1 diast dysfxn, mild LAE, mild RVE, PASP 37.    . Carotid stenosis    dopplers 30/07: LICA 62-26%  . Diabetes mellitus    x 5-6 yrs  . Diverticulitis of colon with perforation   . DM2 (diabetes mellitus, type 2)   . GERD (gastroesophageal reflux disease)   . History of kidney stones   . HLD (hyperlipidemia)   . HTN (hypertension)   . Hypothyroidism   . Kidney stones   . Psoriasis   . RLS (restless legs syndrome)   . S/P CABG x 2 06/2011   Dr. Servando Snare (L-LAD, [facility])  .  S/P radiation therapy 12/20/11 - 01/10/12   Left Breast: 4256 cGy/ 16 Fractions   Past Surgical History:  Procedure Laterality Date  . ABDOMINAL HYSTERECTOMY     age 27's  . APPENDECTOMY    . arthroscopy right knee    . breast biospy  11/04/11   Breast, Left, Needle Core Biopsy 6 Oclock, Benign Breast Tissue wtih Fibrocystic  Changes: Microcalcifications Identified, No Neoplasm or Malignancy Identified  . breast biospy  10/20/11   Breast, Left, Needle Core Biopsy LOQ: Invsive ductal Carcinoman  . BREAST LUMPECTOMY Left 11/22/11   Breast, Lumpectomy, Left with Sentinel Node Biopsy: Invasive High Grade Ductal Carcinoma : No lymphovascular Invasion: High  Grade Ductal Carcinoma In-Situ with Calcification and Comedo Necrosis  . CARDIAC CATHETERIZATION  2012  . COLON RESECTION   1977  . COLON SURGERY     part colectomy-  . COLONOSCOPY    . CORONARY ARTERY BYPASS GRAFT  10/12  . Off-pump coronary artery bypass grafting x2  06/16/2011  . SHOULDER ARTHROSCOPY WITH SUBACROMIAL DECOMPRESSION, ROTATOR CUFF REPAIR AND BICEP TENDON REPAIR Right 10/16/2014   Procedure: RIGHT SHOULDER ARTHROSCOPY WITH SUBACROMIAL DECOMPRESSION, DISTAL CLAVICLE RESECTION AND ROTATOR CUFF REPAIR ;  Surgeon: Marin Shutter, MD;  Location: [facility];  Service: Orthopedics;  Laterality: Right;  . VEIN BYPASS SURGERY  1999     ALLERGIES:  No Known Allergies   CURRENT MEDICATIONS:  Outpatient Encounter Prescriptions as of 04/21/2016  Medication Sig  . albuterol (PROVENTIL HFA;VENTOLIN HFA) 108 (90 BASE) MCG/ACT inhaler Inhale 1-2 puffs into the lungs every 6 (six) hours as needed for wheezing or shortness of breath.  . anastrozole (ARIMIDEX) 1 MG tablet TAKE 1 TABLET DAILY  . aspirin 81 MG tablet Take 81 mg by mouth daily.    . Biotin 5 MG CAPS Take 1 capsule by mouth daily.  Marland Kitchen CALCITRIOL PO Take 2 capsules by mouth daily.  . Calcium Carbonate (OS-CAL PO) Take 1 tablet by mouth daily.  Marland Kitchen CALCIUM PO Take 2 capsules by mouth at bedtime.  . Canagliflozin (INVOKANA) 300 MG TABS Take 1 tablet by mouth every morning.   . cetirizine (ZYRTEC) 10 MG tablet Take 10 mg by mouth daily as needed (allergies).   . Cholecalciferol (VITAMIN D-3) 5000 UNITS TABS Take 1 tablet by mouth daily.  . CRESTOR 20 MG tablet Take 20 mg by mouth daily.   . diazepam (VALIUM) 5 MG tablet Take 0.5-1 tablets (2.5-5 mg total) by mouth every 6 (six) hours as needed for muscle spasms or sedation.  Marland Kitchen esomeprazole (NEXIUM) 40 MG capsule Take 40 mg by mouth daily before breakfast.    . ezetimibe (ZETIA) 10 MG tablet Take 10 mg by mouth daily.    . furosemide (LASIX) 40 MG tablet Take 1 tablet (40 mg total) by mouth daily. Pt is due for a one year follow up appointment. Please call and schedule for further refills  . ibuprofen  (ADVIL,MOTRIN) 200 MG tablet Take 400 mg by mouth every 6 (six) hours as needed for moderate pain (shoulder pain).  Marland Kitchen levofloxacin (LEVAQUIN) 500 MG tablet Take 1 tablet (500 mg total) by mouth daily.  Marland Kitchen levothyroxine (SYNTHROID, LEVOTHROID) 137 MCG tablet Take 137 mcg by mouth daily before breakfast.  . losartan (COZAAR) 50 MG tablet Take 1 tablet (50 mg total) by mouth 2 (two) times daily.  . metoprolol (LOPRESSOR) 50 MG tablet Take 1.5 tablets by mouth twice daily. Pt is due for a one yr follow up appointment. Please  call and schedule for further refills  . Multiple Vitamin (MULTIVITAMIN) capsule Take 1 capsule by mouth daily.    . Multiple Vitamins-Minerals (EYE VITAMINS) CAPS Take 1 capsule by mouth daily.   . ondansetron (ZOFRAN) 4 MG tablet Take 1 tablet (4 mg total) by mouth every 8 (eight) hours as needed for nausea or vomiting.  . pioglitazone (ACTOS) 15 MG tablet Take 15 mg by mouth daily.  . potassium chloride (KLOR-CON M10) 10 MEQ tablet Take 1 tablet (10 mEq total) by mouth daily.  Marland Kitchen rOPINIRole (REQUIP) 3 MG tablet Take 3 mg by mouth at bedtime.    No facility-administered encounter medications on file as of 04/21/2016.      ONCOLOGIC FAMILY HISTORY:  Family History  Problem Relation Age of Onset  . Cancer Mother     throat  . Heart disease Mother   . Hypertension Brother   . Cancer Paternal Uncle   . Heart disease Father     GENETIC COUNSELING/TESTING: None  SOCIAL HISTORY:  AAIRA OESTREICHER is married and lives with her husband in [place], [place].  She has 1 son.  Ms. 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  She is a former smoker (smoked 1 ppd x 6 years); quit 1982. She denies any current tobacco or illicit drug use.  She drinks alcohol socially.    PHYSICAL EXAMINATION:  Vital Signs: Vitals:   04/21/16 0837  BP: 132/64  Pulse: (!) 58  Resp: 18  Temp: 98.9 F (37.2 C)   Filed Weights   04/21/16 0837  Weight: 192 lb 14.4 oz  (87.5 kg)   General: Well-nourished, well-appearing female in no acute distress.  She is unaccompanied today.   HEENT: Head is normocephalic.  Pupils equal and reactive to light and accomodation. Conjunctivae clear without exudate.  Sclerae anicteric. Oral mucosa is pink, moist.  Oropharynx is pink without lesions or erythema.  Lymph: No cervical, supraclavicular, or infraclavicular lymphadenopathy noted on palpation.  Cardiovascular: Regular rate and rhythm.Marland Kitchen Respiratory: Clear to auscultation bilaterally. Chest expansion symmetric; breathing non-labored.  Breast Exam:  -Right breast: No appreciable masses on palpation. No skin redness, thickening, or peau d'orange appearance; dense breast tissue palpated.  No nipple retraction or nipple discharge. -Left breast: No appreciable masses on palpation. No skin redness, thickening, or peau d'orange appearance; no nipple retraction or nipple discharge; mild distortion in symmetry at previous lumpectomy site; healed scar without erythema or nodularity.  -Axilla: No axillary adenopathy bilaterally.  GI: Abdomen soft and round; non-tender, non-distended. Bowel sounds normoactive. No hepatosplenomegaly.   GU: Deferred.  Neuro: No focal deficits. Steady gait.  Psych: Mood and affect normal and appropriate for situation.  Extremities: No edema. Skin: Warm and dry.  LABORATORY DATA:  CBC    Component Value Date/Time   WBC 6.6 04/14/2016 0848   WBC 8.0 10/08/2014 1128   RBC 4.64 04/14/2016 0848   RBC 4.85 10/08/2014 1128   HGB 13.4 04/14/2016 0848   HCT 40.9 04/14/2016 0848   PLT 259 04/14/2016 0848   MCV 88.1 04/14/2016 0848   MCH 28.9 04/14/2016 0848   MCH 29.1 10/08/2014 1128   MCHC 32.8 04/14/2016 0848   MCHC 33.3 10/08/2014 1128   RDW 14.5 04/14/2016 0848   LYMPHSABS 1.8 04/14/2016 0848   MONOABS 0.5 04/14/2016 0848   EOSABS 0.2 04/14/2016 0848   BASOSABS 0.0 04/14/2016 0848   CMP Latest Ref Rng & Units 04/14/2016 05/13/2015 04/16/2015  Glucose 70 - 140 mg/dl 112 126 129  BUN 7.0 - 26.0 mg/dL 18.5 20.8 23.2  Creatinine 0.6 - 1.1 mg/dL 0.9 0.9 0.9  Sodium 136 - 145 mEq/L 140 142 140  Potassium 3.5 - 5.1 mEq/L 4.2 3.8 4.1  Chloride 96 - 112 mmol/L - - -  CO2 22 - 29 mEq/L _0 Calcium 8.4 - 10.4 mg/dL 9.8 9.9 9.8  Total Protein 6.4 - 8.3 g/dL 6.8 6.9 6.7  Total Bilirubin 0.20 - 1.20 mg/dL 0.59 0.93 0.95  Alkaline Phos 40 - 150 U/L 78 87 88  AST 5 - 34 U/L _1 ALT 0 - 55 U/L _2 *Labs reviewed with pt and are largely within normal limits/stable.    DIAGNOSTIC IMAGING:  Most recent mammogram: 12/18/15 ([place]) FINDINGS: 2D and 3D full field views of both breasts and a magnification view of the left breast are performed.  No suspicious mass, nonsurgical distortion or worrisome calcifications identified.  Left breast scarring is again noted.  Mammographic images were processed with CAD.  IMPRESSION: No mammographic evidence of breast malignancy.    ASSESSMENT AND PLAN:  Ms.. Sobh is a pleasant 82 y.o. female with history of Stage IA left breast invasive ductal carcinoma, ER+/PR+/HER2-, diagnosed in 11/2011, treated with lumpectomy, adjuvant radiation therapy, and anti-estrogen therapy with anastrazole beginning in 02/2012.  She [facility] for surveillance and routine follow-up.   1. History of Stage IA left breast cancer:  [name] is currently clinically and radiographically without evidence of diease or recurrence of breast cancer. She will follow-up with her medical oncologist, Dr. Jana Hakim, in 1 year with history & physical exam per surveillance protocol.  She will continue her anti-estrogen therapy with anastrazole until 02/2017 to complete her 5 years of anti-estrogen therapy.  She will be due for annual mammogram in 12/2016; orders placed today.  We discussed that after she sees Dr. Jana Hakim next year for her 5-year visit, she could either  "graduate" from follow-up here at the cancer center, or she could continue seeing [place] in survivorship once per year.  She will discuss this with Dr. Jana Hakim at her next visit, but I am happy to see her next year if she chooses.   2. Osteoporosis: Ms. Justo' last DEXA scan was 04/29/14 showing osteoporosis.  I have placed orders for her to get her biennial DEXA scan in 12/2016 to coordinate with her mammogram.  Since she currently has several dental concerns requiring oral surgery, we will postpone her upcoming Zometa infusion (originally due in 05/2016).  She will call us and let us know when she has healed from surgery and we will place orders and get her scheduled for the Zometa.  I will ask my nurse to follow-up with her in 1 month via phone to see how she is doing.      Dispo:  -Next dose of Zometa due 05/2016; however, pt with dental concerns requiring oral surgery. Encouraged patient to call us when she has healed from her dental procedures (we usually wait 4-6 weeks after dental surgery before next dose of Zometa).  -Annual mammogram and biennial DEXA scan due 12/2016; orders placed today.  -Return to cancer center to see Dr. Jana Hakim in 04/2017.   A total of 20 minutes of face-to-face time was spent with this patient with greater than 50% of that time in counseling and care-coordination.   Mike Craze, NP Survivorship Program  [place] [phone]   Note: [place] D, MD [phone]

## 2016-04-21 NOTE — Patient Instructions (Signed)
It was wonderful to meet you today. Please feel free to call me with any questions!  Just give [place] a call when you're ready for [place] to schedule your Zometa.   Mike Craze, NP [place] [phone]

## 2016-04-27 ENCOUNTER — Encounter: Payer: Self-pay | Admitting: Neurology

## 2016-04-27 ENCOUNTER — Ambulatory Visit (INDEPENDENT_AMBULATORY_CARE_PROVIDER_SITE_OTHER): Payer: Medicare Other | Admitting: Neurology

## 2016-04-27 VITALS — BP 128/62 | HR 62 | Resp 16 | Ht 63.0 in | Wt 190.0 lb

## 2016-04-27 DIAGNOSIS — Z951 Presence of aortocoronary bypass graft: Secondary | ICD-10-CM | POA: Diagnosis not present

## 2016-04-27 DIAGNOSIS — G4710 Hypersomnia, unspecified: Secondary | ICD-10-CM

## 2016-04-27 DIAGNOSIS — G2581 Restless legs syndrome: Secondary | ICD-10-CM | POA: Diagnosis not present

## 2016-04-27 DIAGNOSIS — R0683 Snoring: Secondary | ICD-10-CM | POA: Diagnosis not present

## 2016-04-27 DIAGNOSIS — G4761 Periodic limb movement disorder: Secondary | ICD-10-CM

## 2016-04-27 DIAGNOSIS — E669 Obesity, unspecified: Secondary | ICD-10-CM | POA: Diagnosis not present

## 2016-04-27 MED ORDER — GABAPENTIN ENACARBIL ER 300 MG PO TBCR: 300.0000 mg | EXTENDED_RELEASE_TABLET | Freq: Every day | ORAL | 3 refills | Status: DC

## 2016-04-27 NOTE — Progress Notes (Signed)
Subjective:    Patient ID: [name] is a 82 y.o. female.  HPI     Star Age, MD, PhD [facility] [address] P.O. [place], [place] [address]  Dear Dr. Elyse Hsu,   I saw your patient, [name], upon your kind request in my neurologic clinic today for initial consultation of her restless leg syndrome. The patient is accompanied by her husband today. As you know, [name] is a very pleasant 82 year old right-handed woman with an underlying medical history of osteoporosis, breast cancer, status post left lumpectomy in March 2013, status post radiation therapy, history of coronary artery disease with status post 2 vessel CABG, type 2 diabetes, arthritis, diverticulitis, reflux disease, kidney stones, hyperlipidemia, hypertension, hypothyroidism, kidney stone, psoriasis, reports a long-standing history versus leg syndrome. She has been on Requip/ropinirole. I reviewed your office note from 04/01/2016, which you kindly included. She has had symptoms of restless legs for over 25 years, with time she noted progression, symptoms started sometimes as early as 3 PM but it helps to stay active physically, some nights are worse than others, she is noted to twitch her legs and her sleep quite vehemently at times per husband. She is not aware of any family history of restless legs. However, her son they have started having some symptoms she adds. She has been on Requip for years, probably over 10 or 15 years. She started with Brand name Requip and then switch to generic, always on 3 mg at night, she takes it around 7 PM. Sometimes it does not seem to help at all, in the beginning it helped better. She had open heart surgery in 2012, sees her cardiologist now once a year, appointment pending for October of this year. Of note, she snores, she has woken up with a sense of gasping and panic infrequently. Her husband sleeps quite well, uses a CPAP machine. She has  never had a sleep study. She does have daytime tiredness and fatigue as well as sleepiness sometimes. Epworth sleepiness score is 7 out of 24 today, her fatigue score is 39 out of 63. She has nocturia 2-3 times per night on average, denies morning headaches, does wake up with leg twitching fairly frequently and sometimes her legs twitch even when she is trying to walk. She has practically eliminated caffeine from her diet. She drinks alcohol infrequently and quit smoking many years ago, in 1989. She is retired from Engineer, [employer], they have 1 grown son.  Her Past Medical History Is Significant For: Past Medical History:  Diagnosis Date  . Arthritis   . Breast cancer ([place]) 10/20/11   L breast, ER/PR+, HER2 -  . Breast cancer, left breast ([place]) 11/01/2011  . Bronchitis    hx of  . CAD (coronary artery disease)    LHC 06/14/11: LAD 80% and 80% after Dx, Dx 80% (Dx was small), oOM 80%, mOM 70% (OM was small), oRCA 70-80%, mRCA 60-70%, EF 55% - referred for CABG;   Echo 06/14/11: mild focal basal septal hypertrophy, grade 1 diast dysfxn, mild LAE, mild RVE, PASP 37.    . Carotid stenosis    dopplers 75/10: LICA 25-85%  . Diabetes mellitus    x 5-6 yrs  . Diverticulitis of colon with perforation   . DM2 (diabetes mellitus, type 2) ([place])   . GERD (gastroesophageal reflux disease)   . History of kidney stones   . HLD (hyperlipidemia)   . HTN (hypertension)   . Hypothyroidism   .  Kidney stones   . Psoriasis   . RLS (restless legs syndrome)   . S/P CABG x 2 06/2011   Dr. Servando Snare (L[facility]LAD, [facility])  . S/P radiation therapy 12/20/11 - 01/10/12   Left Breast: 8338 cGy/ 16 Fractions    Her Past Surgical History Is Significant For: Past Surgical History:  Procedure Laterality Date  . ABDOMINAL HYSTERECTOMY     age 33's  . APPENDECTOMY    . arthroscopy right knee    . breast biospy  11/04/11   Breast, Left, Needle Core Biopsy 6 Oclock, Benign Breast Tissue wtih Fibrocystic  Changes: Microcalcifications  Identified, No Neoplasm or Malignancy Identified  . breast biospy  10/20/11   Breast, Left, Needle Core Biopsy LOQ: Invsive ductal Carcinoman  . BREAST LUMPECTOMY Left 11/22/11   Breast, Lumpectomy, Left with Sentinel Node Biopsy: Invasive High Grade Ductal Carcinoma : No lymphovascular Invasion: High  Grade Ductal Carcinoma In-Situ with Calcification and Comedo Necrosis  . CARDIAC CATHETERIZATION  2012  . COLON RESECTION  1977  . COLON SURGERY     part colectomy-  . COLONOSCOPY    . CORONARY ARTERY BYPASS GRAFT  10/12  . Off-pump coronary artery bypass grafting x2  06/16/2011  . SHOULDER ARTHROSCOPY WITH SUBACROMIAL DECOMPRESSION, ROTATOR CUFF REPAIR AND BICEP TENDON REPAIR Right 10/16/2014   Procedure: RIGHT SHOULDER ARTHROSCOPY WITH SUBACROMIAL DECOMPRESSION, DISTAL CLAVICLE RESECTION AND ROTATOR CUFF REPAIR ;  Surgeon: Marin Shutter, MD;  Location: [facility];  Service: Orthopedics;  Laterality: Right;  . [place]    Her Family History Is Significant For: Family History  Problem Relation Age of Onset  . Cancer Mother     throat  . Heart disease Mother   . Heart disease Father   . Hypertension Brother   . Cancer Paternal Uncle     Her Social History Is Significant For: Social History   Social History  . Marital status: Married    Spouse name: N/A  . Number of children: 1  . Years of education: HS   Occupational History  . RETIRED-worked for an accounting firm Retired   Social History Main Topics  . Smoking status: Former Smoker    Packs/day: 1.00    Years: 6.00    Types: Cigarettes    Quit date: 09/05/1980  . Smokeless tobacco: Never Used  . Alcohol use Yes     Comment: socially  . Drug use: No  . Sexual activity: Not Asked     Comment: menarche 36, p2, 1 stillbirth, 1st preg age 26, HRT x many yrs   Other Topics Concern  . None   Social History Narrative   Denies caffeine use     Her Allergies Are:  No Known Allergies:   Her Current  Medications Are:  Outpatient Encounter Prescriptions as of 04/27/2016  Medication Sig  . albuterol (PROVENTIL HFA;VENTOLIN HFA) 108 (90 BASE) MCG/ACT inhaler Inhale 1-2 puffs into the lungs every 6 (six) hours as needed for wheezing or shortness of breath.  . anastrozole (ARIMIDEX) 1 MG tablet TAKE 1 TABLET DAILY  . aspirin 81 MG tablet Take 81 mg by mouth daily.    . Biotin 5 MG CAPS Take 1 capsule by mouth daily.  Marland Kitchen CALCITRIOL PO Take 2 capsules by mouth daily.  . Calcium Carbonate (OS-CAL PO) Take 1 tablet by mouth daily.  . Canagliflozin (INVOKANA) 300 MG TABS Take 1 tablet by mouth every morning.   . cetirizine (ZYRTEC) 10 MG tablet Take  10 mg by mouth daily as needed (allergies).   . Cholecalciferol (VITAMIN D-3) 5000 UNITS TABS Take 1 tablet by mouth daily.  . CRESTOR 20 MG tablet Take 20 mg by mouth daily.   . diazepam (VALIUM) 5 MG tablet Take 0.5-1 tablets (2.5-5 mg total) by mouth every 6 (six) hours as needed for muscle spasms or sedation.  Marland Kitchen esomeprazole (NEXIUM) 40 MG capsule Take 40 mg by mouth daily before breakfast.    . ezetimibe (ZETIA) 10 MG tablet Take 10 mg by mouth daily.    . furosemide (LASIX) 40 MG tablet Take 1 tablet (40 mg total) by mouth daily. Pt is due for a one year follow up appointment. Please call and schedule for further refills  . Glucosamine-Chondroitin (GLUCOSAMINE CHONDR COMPLEX PO) Take 1 capsule by mouth 2 (two) times daily.  Marland Kitchen ibuprofen (ADVIL,MOTRIN) 200 MG tablet Take 400 mg by mouth every 6 (six) hours as needed for moderate pain (shoulder pain).  Marland Kitchen levothyroxine (SYNTHROID, LEVOTHROID) 137 MCG tablet Take 137 mcg by mouth daily before breakfast.  . losartan (COZAAR) 50 MG tablet Take 1 tablet (50 mg total) by mouth 2 (two) times daily.  . metoprolol (LOPRESSOR) 50 MG tablet Take 1.5 tablets by mouth twice daily. Pt is due for a one yr follow up appointment. Please call and schedule for further refills  . Multiple Vitamin (MULTIVITAMIN) capsule Take  1 capsule by mouth daily.    . Multiple Vitamins-Minerals (EYE VITAMINS) CAPS Take 1 capsule by mouth daily.   . pioglitazone (ACTOS) 15 MG tablet Take 15 mg by mouth daily.  . potassium chloride (KLOR-CON M10) 10 MEQ tablet Take 1 tablet (10 mEq total) by mouth daily.  Marland Kitchen rOPINIRole (REQUIP) 3 MG tablet Take 3 mg by mouth at bedtime.    No facility-administered encounter medications on file as of 04/27/2016.   :   Review of Systems:  Out of a complete 14 point review of systems, all are reviewed and negative with the exception of these symptoms as listed below:  Review of Systems  Neurological:       Patient states that she has restless legs at night.  Takes requip at night, and rarely valium if her legs "get too bad".  Patient has no trouble falling asleep after taking Requip at night, reports trouble staying asleep, snoring, witnessed apnea, some daytime tiredness.   Epworth Sleepiness Scale 0= would never doze 1= slight chance of dozing 2= moderate chance of dozing 3= high chance of dozing  Sitting and reading:2 Watching TV:2 Sitting inactive in a public place (ex. Theater or meeting):1 As a passenger in a car for an hour without a break:1 Lying down to rest in the afternoon:0 Sitting and talking to someone:0 Sitting quietly after lunch (no alcohol):1  In a car, while stopped in traffic:0 Total:7   Objective:  Neurologic Exam  Physical Exam Physical Examination:   Vitals:   04/27/16 0855  BP: 128/62  Pulse: 62  Resp: 16   General Examination: The patient is a very pleasant 82 y.o. female in no acute distress. She appears well-developed and well-nourished and well groomed.   HEENT: Normocephalic, atraumatic, pupils are equal, round and reactive to light and accommodation. Funduscopic exam is normal with sharp disc margins noted. Extraocular tracking is good without limitation to gaze excursion or nystagmus noted. Normal smooth pursuit is noted. Hearing is grossly  intact. Tympanic membranes are clear bilaterally. Face is symmetric with normal facial animation and normal facial sensation.  Speech is clear with no dysarthria noted. There is no hypophonia. There is no lip, neck/head, jaw or voice tremor. Neck is supple with full range of passive and active motion. There are no carotid bruits on auscultation. Oropharynx exam reveals: mild mouth dryness, adequate dental hygiene and moderate airway crowding, due to small airway entry and redundant soft palate, tonsils are not fully visualized. Mallampati is class II. Tongue protrudes centrally and palate elevates symmetrically. Neck size is 16.5 inches. She has a Mild overbite.    Chest: Clear to auscultation without wheezing, rhonchi or crackles noted.  Heart: S1+S2+0, regular and normal without murmurs, rubs or gallops noted.   Abdomen: Soft, non-tender and non-distended with normal bowel sounds appreciated on auscultation.  Extremities: There is 2+ pitting edema in the distal lower extremities bilaterally, right more than left, of note, she had vein harvesting from the right distal leg and did not take her Lasix this morning. Pedal pulses are intact.  Skin: Warm and dry without trophic changes noted. There are no varicose veins.  Musculoskeletal: exam reveals no obvious joint deformities, tenderness or joint swelling or erythema.   Neurologically:  Mental status: The patient is awake, alert and oriented in all 4 spheres. Her immediate and remote memory, attention, language skills and fund of knowledge are appropriate. There is no evidence of aphasia, agnosia, apraxia or anomia. Speech is clear with normal prosody and enunciation. Thought process is linear. Mood is normal and affect is normal.  Cranial nerves II - XII are as described above under HEENT exam. In addition: shoulder shrug is normal with equal shoulder height noted. Motor exam: Normal bulk, strength and tone is noted. There is no drift, tremor or  rebound. Romberg is negative. Reflexes are 2+ throughout. Babinski: Toes are flexor bilaterally. Fine motor skills and coordination: intact with normal finger taps, normal hand movements, normal rapid alternating patting, normal foot taps and normal foot agility.  Cerebellar testing: No dysmetria or intention tremor on finger to nose testing. Heel to shin is unremarkable bilaterally. There is no truncal or gait ataxia.  Sensory exam: intact to light touch, pinprick, vibration, temperature sense in the upper and lower extremities, with the exception of decreased vibration sense in the distal lower extremities bilaterally, perhaps mild decrease in pinprick sensation in the feet.  Gait, station and balance: She stands easily. No veering to one side is noted. No leaning to one side is noted. Posture is age-appropriate and stance is narrow based. Gait shows normal stride length and normal pace. No problems turning are noted.      Assessment and Plan:   In summary, [name] is a very pleasant 82 y.o.-year old female with an underlying medical history of osteoporosis, breast cancer, status post left lumpectomy in March 2013, status post radiation therapy, history of coronary artery disease with status post 2 vessel CABG, type 2 diabetes, arthritis, diverticulitis, reflux disease, kidney stones, hyperlipidemia, hypertension, hypothyroidism, kidney stone, psoriasis, who presents for initial consultation of her long-standing history of restless leg syndrome, for which she has been on ropinirole for over 10 years at 3 mg strength. With time she has noted progression in her symptoms, on examination she has a nonfocal neurological exam with the exception of perhaps mild peripheral neuropathy, and her history and physical exam are concerning for underlying obstructive sleep apnea as well. I had a long discussion today with the patient and her husband regarding her symptoms and her diagnoses. She has lower extremity  swelling which  could in part be due to taking ropinirole. I would not recommend increasing the dose at this time, we can continue with the current dose but as an adjunct I would like to add horizant 300 mg each night, about 30 minutes before bedtime. In addition, I would like to proceed with a sleep study to rule out sleep apnea as she has underlying heart disease, is obese, reports nocturia, nonrestorative sleep at times and daytime somnolence as well as a history of snoring and waking up with a sense of gasping or panic at times. In addition, if she were to have sleep apnea and started treatment with CPAP, there is a chance that her restless legs and PLMS may improve, her history is suggestive of PLMD as well.   I provided a new prescription for Horizant 300 mg, in order to sleep study, I will see her back after the sleep study is completed and we will also call her with her results. I gave her written instructions as well today.  Thank you very much for allowing me to participate in the care of this nice patient. If I can be of any further assistance to you please do not hesitate to call me at [phone].  Sincerely,   Star Age, MD, PhD

## 2016-04-27 NOTE — Patient Instructions (Addendum)
You are on a fairly high dose requip for your restless legs, I would not recommend going higher, as you may already be experiencing what we call augmentation of your restless legs symptoms, that is accelerated symptoms, affecting your upper body and sooner in the day, than just at night. Requip can cause swelling of the legs as well.  We will try as an adjunct: Horizant 300 mg, take 1 pill about 30 min before your bedtime. The most common side effects reported are sedation or sleepiness. Rare side effects include balance problems, confusion.   Based on your symptoms and your exam I believe you are at risk for obstructive sleep apnea or OSA, and I think we should proceed with a sleep study to determine whether you do or do not have OSA and how severe it is. If you have more than mild OSA, I want you to consider treatment with CPAP. Please remember, the risks and ramifications of moderate to severe obstructive sleep apnea or OSA are: Cardiovascular disease, including congestive heart failure, stroke, difficult to control hypertension, arrhythmias, and even type 2 diabetes has been linked to untreated OSA. Sleep apnea causes disruption of sleep and sleep deprivation in most cases, which, in turn, can cause recurrent headaches, problems with memory, mood, concentration, focus, and vigilance. Most people with untreated sleep apnea report excessive daytime sleepiness, which can affect their ability to drive. Please do not drive if you feel sleepy.   I will likely see you back after your sleep study to go over the test results and where to go from there. We will call you after your sleep study to advise about the results (most likely, you will hear from Beverlee Nims, my nurse) and to set up an appointment at the time, as necessary.    Our sleep lab administrative assistant, Arrie Aran will meet with you or call you to schedule your sleep study. If you don't hear back from her by next week please feel free to call her at  [phone]. This is her direct line and please leave a message with your phone number to call back if you get the voicemail box. She will call back as soon as possible.

## 2016-04-28 DIAGNOSIS — Z87440 Personal history of urinary (tract) infections: Secondary | ICD-10-CM | POA: Diagnosis not present

## 2016-04-29 DIAGNOSIS — Z124 Encounter for screening for malignant neoplasm of cervix: Secondary | ICD-10-CM | POA: Diagnosis not present

## 2016-04-29 DIAGNOSIS — N950 Postmenopausal bleeding: Secondary | ICD-10-CM | POA: Diagnosis not present

## 2016-05-03 DIAGNOSIS — R3121 Asymptomatic microscopic hematuria: Secondary | ICD-10-CM | POA: Diagnosis not present

## 2016-05-06 ENCOUNTER — Telehealth: Payer: Self-pay | Admitting: *Deleted

## 2016-05-06 NOTE — Telephone Encounter (Signed)
Called pt to see if she had dental surgery yet. Pt has not  due to some kidney issues going on, she is being seen at [facility]. Pt said once she cleared of those issues and appts, she'll schedule the dental surgery afterwards and  will let us know. Message to be fwd to [employer].

## 2016-05-10 ENCOUNTER — Other Ambulatory Visit: Payer: Self-pay | Admitting: Cardiovascular Disease

## 2016-05-12 DIAGNOSIS — N200 Calculus of kidney: Secondary | ICD-10-CM | POA: Diagnosis not present

## 2016-05-12 DIAGNOSIS — R310 Gross hematuria: Secondary | ICD-10-CM | POA: Diagnosis not present

## 2016-05-20 ENCOUNTER — Other Ambulatory Visit: Payer: Self-pay

## 2016-05-20 ENCOUNTER — Other Ambulatory Visit: Payer: Self-pay | Admitting: Cardiovascular Disease

## 2016-05-22 ENCOUNTER — Ambulatory Visit (INDEPENDENT_AMBULATORY_CARE_PROVIDER_SITE_OTHER): Payer: Medicare Other | Admitting: Neurology

## 2016-05-22 DIAGNOSIS — G4710 Hypersomnia, unspecified: Secondary | ICD-10-CM | POA: Diagnosis not present

## 2016-05-22 DIAGNOSIS — G4720 Circadian rhythm sleep disorder, unspecified type: Secondary | ICD-10-CM

## 2016-05-27 ENCOUNTER — Telehealth: Payer: Self-pay | Admitting: Neurology

## 2016-05-27 NOTE — Telephone Encounter (Signed)
Patient referred by Dr. Elyse Hsu, seen by me on 04/27/16, diagnostic PSG on 05/22/16.   Please call and notify the patient that the recent sleep study did not show any significant obstructive sleep apnea or PLMs, however, she did not sleep very much at all. Please inform patient that I would like to go over the details of the study during a follow up appointment. Arrange a followup appointment. Also, route or fax report to PCP and referring MD, if other than PCP.  Once you have spoken to patient, you can close this encounter.   Thanks,  Star Age, MD, PhD [facility])

## 2016-05-27 NOTE — Progress Notes (Signed)
PATIENT'S NAME:  [name], [name] DOB:      December 12, 1932      MR#:    [id number]     DATE OF RECORDING: 05/22/2016 REFERRING M.D.:  Lorne Skeens, MD Study Performed:   Baseline Polysomnogram HISTORY:  82 year old right-handed woman with an underlying medical history of osteoporosis, breast cancer, status post left lumpectomy in March 2013, status post radiation therapy, history of coronary artery disease with status post 2 vessel CABG, type 2 diabetes, arthritis, diverticulitis, reflux disease, kidney stones, hyperlipidemia, hypertension, hypothyroidism, kidney stone, psoriasis, reports a long-standing history of restless leg syndrome.   The patient endorsed the Epworth Sleepiness Scale at 7/24 points.    The patient's weight 190 pounds with a height of 63 (inches), resulting in a BMI of 33.6 kg/m2.  The patient's neck circumference measured 16.5 inches.  CURRENT MEDICATIONS: Albuterol, Anastrozole, Aspirin, Biotin, Calcitriol, Calcium Carbonate, Canagliflozin, Cetirizine, Cholecalciferolk, Crestor, Diazepam, Esomeprazole, Ezetimibe, Furosemide, Gabapentin, Glucosamine, Ibuprofen, Levothyroxine, Losartan, Metoprolol,   PROCEDURE:  This is a multichannel digital polysomnogram utilizing the Somnostar 11.2 system.  Electrodes and sensors were applied and monitored per AASM Specifications.   EEG, EOG, Chin and Limb EMG, were sampled at 200 Hz.  ECG, Snore and Nasal Pressure, Thermal Airflow, Respiratory Effort, CPAP Flow and Pressure, Oximetry was sampled at 50 Hz. Digital video and audio were recorded.      BASELINE STUDY  Lights Out was at 21:52 and Lights On at 04:47.  Total recording time (TRT) was 416, with a total sleep time (TST) of 176.5 minutes. The sleep efficiency was markedly reduced at 42.4 %. The patient's sleep latency was 2.5 minutes. Wake after sleep onset was high at 181 minutes with moderate sleep fragmentation noted.      SLEEP ARCHITECTURE: Sleep Period Wake was 181 minutes,  Stage N1 6.8%, Stage N2 highly elevated at  90.1%, Stage N3 3.1% and Stage R (REM sleep) 0%.   The video and audio analysis did not show any abnormal or unusual behaviors, movements, phonations or vocalizations.  RESPIRATORY ANALYSIS:  There was a total of 1 respiratory event: 0 obstructive apneas, 0 central apneas and 0 mixed apneas with a total of 0 apneas and an apnea index (AI) of 0. There were 1 hypopnea with a hypopnea index of 0.3. The patient had 0 respiratory event related arousals (RERAs).      The total APNEA/HYPOPNEA INDEX (AHI) was 0.3 and the total RESPIRATORY DISTURBANCE INDEX was 0.3.  0 events occurred in REM sleep and 2 events in NREM. The non-REM AHI of .3. The patient spent 80% of total sleep time in the supine position. The supine AHI was 0.4 versus a non-supine AHI of 0.0.  OXYGEN SATURATION & C02:  The baseline 02 saturation was 98%, with the lowest being 83%. Time spent below 89% saturation equaled 0 minutes.   PERIODIC LIMB MOVEMENTS:   The patient had a total of 19 Periodic Limb Movements.  The Periodic Limb Movement (PLM) index was 6.5 and the PLM Arousal index was 0.   IMPRESSION:  1.  Dysfunctions associated with sleep stages or arousal from sleep  2.  Hypersomnia, unspecified   RECOMMENDATIONS:  1.  This study did not show any significant sleep disordered breathing. Please note, the absence of REM sleep may underestimate her AHI and desaturation nadir.  2.  This study did not show any significant PLMs or PLM associated arousals. Clinical correlation is recommended.  3.  This study showed sleep fragmentation and abnormal sleep  stage percentages; these are non-specific findings and per se do not signify an intrinsic or organic sleep disorder of a cause for the patient's symptoms. Causes include (but are not limited to) the "first night effect" of a sleep study, circadian rhythm disturbances, medication effect or an underlying medical or psychiatric disorder.  4.   The patient will be notified of her test results and a copy of the report will be sent to her referring provider.   I certify that I have reviewed the entire raw data recording prior to the issuance of this report in accordance with the Standards of Accreditation of the American Academy of Sleep Medicine (AASM)       Star Age, MD, PhD Diplomat, American Board of Psychiatry and Neurology  Diplomat, [place] of [facility]

## 2016-05-31 NOTE — Telephone Encounter (Signed)
LM (per DPR) with results below. I asked for patient to call back and make f/u appt with Dr. Rexene Alberts to discuss results further. Left call back number. Copy sent to PCP.

## 2016-06-06 ENCOUNTER — Other Ambulatory Visit: Payer: Self-pay | Admitting: Cardiovascular Disease

## 2016-06-09 DIAGNOSIS — R310 Gross hematuria: Secondary | ICD-10-CM | POA: Diagnosis not present

## 2016-06-16 ENCOUNTER — Other Ambulatory Visit: Payer: Self-pay | Admitting: *Deleted

## 2016-06-16 MED ORDER — LOSARTAN POTASSIUM 50 MG PO TABS: 50.0000 mg | ORAL_TABLET | Freq: Two times a day (BID) | ORAL | 0 refills | Status: DC

## 2016-06-30 NOTE — Progress Notes (Signed)
Chief Complaint  Patient presents with  . Follow-up    History of Present Illness: 82 yo female with history of CAD s/p CABG 2012, HTN, HLD, DM, carotid artery disease, hypothyroidism, breast cancer here today for cardiac follow up. She has been followed by Dr. Lia Foyer. She presented with unstable angina in October 2012 and was found to have severe triple vessel CAD by cath, followed by 2V CABG per Dr. Servando Snare (LIMA to LAD, SVG to RCA). She has done well since then. Breast cancer s/p lumpectomy and has completed radiation. She takes Arimidex. I met her in November 2014 and at our first visit, she c/o dyspnea. BP was elevated and Cozaar was added. Echo 07/29/13 with normal LVEF, normal PA pressures, no valve disease. Carotid dopplers 08/09/13 with mild bilateral carotid artery disease. Stress myoview arranged 08/11/14 in workup of jaw and neck pain and showed no ischemia.   She is here today for follow up. No chest pain. Occasional SOB with moderate exertion. No dizziness, near syncope or syncope.  She has been busy at [employer] to raise money.   Primary Care Physician: [name], MD   Past Medical History:  Diagnosis Date  . Arthritis   . Breast cancer ([place]) 10/20/11   L breast, ER/PR+, HER2 -  . Breast cancer, left breast ([place]) 11/01/2011  . Bronchitis    hx of  . CAD (coronary artery disease)    LHC 06/14/11: LAD 80% and 80% after Dx, Dx 80% (Dx was small), oOM 80%, mOM 70% (OM was small), oRCA 70-80%, mRCA 60-70%, EF 55% - referred for CABG;   Echo 06/14/11: mild focal basal septal hypertrophy, grade 1 diast dysfxn, mild LAE, mild RVE, PASP 37.    . Carotid stenosis    dopplers 06/26: LICA 94-85%  . Diabetes mellitus    x 5-6 yrs  . Diverticulitis of colon with perforation   . DM2 (diabetes mellitus, type 2) ([place])   . GERD (gastroesophageal reflux disease)   . History of kidney stones   . HLD (hyperlipidemia)   . HTN (hypertension)   . Hypothyroidism   . Kidney  stones   . Psoriasis   . RLS (restless legs syndrome)   . S/P CABG x 2 06/2011   Dr. Servando Snare (L-LAD, [facility])  . S/P radiation therapy 12/20/11 - 01/10/12   Left Breast: 4256 cGy/ 16 Fractions    Past Surgical History:  Procedure Laterality Date  . ABDOMINAL HYSTERECTOMY     age 72's  . APPENDECTOMY    . arthroscopy right knee    . breast biospy  11/04/11   Breast, Left, Needle Core Biopsy 6 Oclock, Benign Breast Tissue wtih Fibrocystic  Changes: Microcalcifications Identified, No Neoplasm or Malignancy Identified  . breast biospy  10/20/11   Breast, Left, Needle Core Biopsy LOQ: Invsive ductal Carcinoman  . BREAST LUMPECTOMY Left 11/22/11   Breast, Lumpectomy, Left with Sentinel Node Biopsy: Invasive High Grade Ductal Carcinoma : No lymphovascular Invasion: High  Grade Ductal Carcinoma In-Situ with Calcification and [place]edo Necrosis  . CARDIAC CATHETERIZATION  2012  . COLON RESECTION  1977  . COLON SURGERY     part colectomy-  . COLONOSCOPY    . CORONARY ARTERY BYPASS GRAFT  10/12  . Off-pump coronary artery bypass grafting x2  06/16/2011  . SHOULDER ARTHROSCOPY WITH SUBACROMIAL DECOMPRESSION, ROTATOR CUFF REPAIR AND BICEP TENDON REPAIR Right 10/16/2014   Procedure: RIGHT SHOULDER ARTHROSCOPY WITH SUBACROMIAL DECOMPRESSION, DISTAL CLAVICLE RESECTION AND ROTATOR CUFF REPAIR ;  Surgeon: Marin Shutter, MD;  Location: [place];  Service: Orthopedics;  Laterality: Right;  . VEIN BYPASS SURGERY  1999    Current Outpatient Prescriptions  Medication Sig Dispense Refill  . albuterol (PROVENTIL HFA;VENTOLIN HFA) 108 (90 BASE) MCG/ACT inhaler Inhale 1-2 puffs into the lungs every 6 (six) hours as needed for wheezing or shortness of breath. 1 Inhaler 1  . anastrozole (ARIMIDEX) 1 MG tablet TAKE 1 TABLET DAILY 90 tablet 2  . aspirin 81 MG tablet Take 81 mg by mouth daily.      . Biotin 5 MG CAPS Take 1 capsule by mouth daily.    Marland Kitchen CALCITRIOL PO Take 2 capsules by mouth daily.    . Calcium Carbonate  (OS-CAL PO) Take 1 tablet by mouth daily.    . Canagliflozin (INVOKANA) 300 MG TABS Take 1 tablet by mouth every morning.     . cetirizine (ZYRTEC) 10 MG tablet Take 10 mg by mouth daily as needed (allergies).     . Cholecalciferol (VITAMIN D-3) 5000 UNITS TABS Take 1 tablet by mouth daily.    . CRESTOR 20 MG tablet Take 20 mg by mouth daily.     . diazepam (VALIUM) 5 MG tablet Take 0.5-1 tablets (2.5-5 mg total) by mouth every 6 (six) hours as needed for muscle spasms or sedation. 40 tablet 1  . esomeprazole (NEXIUM) 40 MG capsule Take 40 mg by mouth daily before breakfast.      . ezetimibe (ZETIA) 10 MG tablet Take 10 mg by mouth daily.      . furosemide (LASIX) 40 MG tablet Take 1 tablet (40 mg total) by mouth daily. Please keep 07/01/16 appt for further refills. 45 tablet 0  . Gabapentin Enacarbil ER (HORIZANT) 300 MG TBCR Take 300 mg by mouth at bedtime. 90 tablet 3  . Glucosamine-Chondroitin (GLUCOSAMINE CHONDR COMPLEX PO) Take 1 capsule by mouth 2 (two) times daily.    Marland Kitchen ibuprofen (ADVIL,MOTRIN) 200 MG tablet Take 400 mg by mouth every 6 (six) hours as needed for moderate pain (shoulder pain).    Marland Kitchen KLOR-CON M10 10 MEQ tablet TAKE 1 TABLET DAILY 90 tablet 0  . levothyroxine (SYNTHROID, LEVOTHROID) 137 MCG tablet Take 137 mcg by mouth daily before breakfast.    . losartan (COZAAR) 50 MG tablet Take 1 tablet (50 mg total) by mouth 2 (two) times daily. 180 tablet 3  . metoprolol (LOPRESSOR) 50 MG tablet Take 1.5 tablets (75 mg total) by mouth 2 (two) times daily. Please keep 07/01/16 appt for further refills 270 tablet 0  . Multiple Vitamin (MULTIVITAMIN) capsule Take 1 capsule by mouth daily.      . Multiple Vitamins-Minerals (EYE VITAMINS) CAPS Take 1 capsule by mouth daily.     . pioglitazone (ACTOS) 15 MG tablet Take 15 mg by mouth daily.    Marland Kitchen rOPINIRole (REQUIP) 3 MG tablet Take 3 mg by mouth at bedtime.      No current facility-administered medications for this visit.     No Known  Allergies  Social History   Social History  . Marital status: Married    Spouse name: N/A  . Number of children: 1  . Years of education: HS   Occupational History  . RETIRED-worked for an accounting firm Retired   Social History Main Topics  . Smoking status: Former Smoker    Packs/day: 1.00    Years: 6.00    Types: Cigarettes    Quit date: 09/05/1980  . Smokeless tobacco: Never  Used  . Alcohol use Yes     Comment: socially  . Drug use: No  . Sexual activity: Not on file     Comment: menarche 60, p2, 1 stillbirth, 1st preg age 52, HRT x many yrs   Other Topics Concern  . Not on file   Social History Narrative   Denies caffeine use     Family History  Problem Relation Age of Onset  . Cancer Mother     throat  . Heart disease Mother   . Heart disease Father   . Hypertension Brother   . Cancer Paternal Uncle     Review of Systems:  As stated in the HPI and otherwise negative.   BP (!) 122/56   Pulse (!) 52   Ht _0  (1.575 m)   Wt 192 lb 6.4 oz (87.3 kg)   BMI 35.19 kg/m   Physical Examination: General: Well developed, well nourished, NAD  HEENT: OP clear, mucus membranes moist  SKIN: warm, dry. No rashes. Neuro: No focal deficits  Musculoskeletal: Muscle strength 5/5 all ext  Psychiatric: Mood and affect normal  Neck: No JVD, no carotid bruits, no thyromegaly, no lymphadenopathy.  Lungs:Clear bilaterally, no wheezes, rhonci, crackles Cardiovascular: Regular rate and rhythm. No murmurs, gallops or rubs. Abdomen:Soft. Bowel sounds present. Non-tender.  Extremities: Trace to 1+ bilateral lower extremity edema. Pulses are 2 + in the bilateral DP/PT.  Cardiac cath 06/12/11: 2. The left main is free of critical disease.  3. The LAD has a bifurcation stenosis. There is about 80% involvement  of the LAD and then 80% involvement of diagonal. There is 80%  lesion in the LAD just beyond the diagonal. The distal LAD is  suitable for grafting.  4. The  circumflex provides a first marginal that has probably 80%  ostial and then a 70% mid lesion. The remaining portion of the  circumflex is fairly large and is without critical narrowing other  than minor luminal irregularity.  5. The right coronary artery has clear-cut 70-80% ostial stenosis.  There is also a 60-70% mid vessel stenosis. The PDA and PLA are  suitable for grafting.  6. Ventriculography in the RAO projection reveals well-preserved left  ventricular function with a normal ejection fraction 55% by  estimate. I did not appreciate significant regurgitation.  Echo 07/29/13: Left ventricle: The cavity size was normal. Wall thickness was increased in a pattern of mild LVH. Systolic function was normal. The estimated ejection fraction was in the range of 50% to 55%. Doppler parameters are consistent with abnormal left ventricular relaxation (grade 1 diastolic dysfunction). - Pulmonary arteries: PA peak pressure: 40m Hg (S).  Stress myoview 08/11/14: Stress Procedure: The patient received IV adenosine at 140 mcg/kg/min for 4 minutes. Technetium [id number] was injected at the 2 minute mark and quantitative spect images were obtained after a 45 minute delay. During the infusion of Adenosine, the patient complained of chest, jaw and neck tightness as well as SOB. These symptoms resolved in recovery.  Stress ECG: No significant change from baseline ECG  QPS Raw Data Images: Normal; no motion artifact; normal heart/lung ratio. Stress Images: Normal homogeneous uptake in all areas of the myocardium. Rest Images: Normal homogeneous uptake in all areas of the myocardium. Subtraction (SDS): No evidence of ischemia. Transient Ischemic Dilatation (Normal <1.22): 0.93 Lung/Heart Ratio (Normal <0.45): 0.30  Quantitative Gated Spect Images QGS EDV: 86 ml QGS ESV: 16 ml  Impression Exercise Capacity: Adenosine study with no exercise. BP Response:  Normal blood pressure  response. Clinical Symptoms: Mild chest pain/dyspnea. ECG Impression: Baseline: LBBB. EKG uninterpretable due to LBBB at rest and stress. Comparison with Prior Nuclear Study: No previous nuclear study performed  Overall Impression: Low risk stress nuclear study. No evidence of ischemia by perfusion. LBBB present at rest and with stress. Vigorous LV systolic function.  LV Ejection Fraction: 80%. LV Wall Motion: NL LV Function; NL Wall Motion  EKG:  EKG is  ordered today. The ekg ordered today demonstrates Sinus brady, rate 52 bpm. LBBB. Chronic  Recent Labs: 04/14/2016: ALT 17; BUN 18.5; Creatinine 0.9; HGB 13.4; Platelets 259; Potassium 4.2; Sodium 140   Lipid Panel No results found for: CHOL, TRIG, HDL, CHOLHDL, VLDL, LDLCALC, LDLDIRECT   Wt Readings from Last 3 Encounters:  07/01/16 192 lb 6.4 oz (87.3 kg)  04/27/16 190 lb (86.2 kg)  04/21/16 192 lb 14.4 oz (87.5 kg)     Other studies Reviewed: Additional studies/ records that were reviewed today include: . Review of the above records demonstrates:    Assessment and Plan:   1. CAD:  Stable. Stress myoview December 2015 with no ischemia. Will continue medical management with ASA, statin and beta blocker.   2. HTN: BP is controlled. Will continue current meds.   3. Hyperlipidemia: Continue statin. Well controlled. Labs are updated in primary care per pt  4. LBBB: Chronic  5. Carotid artery disease: Mild carotid artery disease by dopplers December 2015.  Will repeat now.   6. Chronic diastolic CHF: Stable. Continue Lasix  40 mg daily.   Current medicines are reviewed at length with the patient today.  The patient does not have concerns regarding medicines.  The following changes have been made:  no change  Labs/ tests ordered today include:   Orders Placed This Encounter  Procedures  . EKG 12-Lead    Disposition:   FU with me in 12  months  Signed, Lauree Chandler, MD 07/01/2016 8:48 AM    [facility] [place], [place], [place]  [address] Phone: [phone]; Fax: [phone]

## 2016-07-01 ENCOUNTER — Ambulatory Visit (INDEPENDENT_AMBULATORY_CARE_PROVIDER_SITE_OTHER): Payer: Medicare Other | Admitting: Cardiovascular Disease

## 2016-07-01 ENCOUNTER — Encounter: Payer: Self-pay | Admitting: Cardiovascular Disease

## 2016-07-01 VITALS — BP 122/56 | HR 52 | Ht 62.0 in | Wt 192.4 lb

## 2016-07-01 DIAGNOSIS — I25810 Atherosclerosis of coronary artery bypass graft(s) without angina pectoris: Secondary | ICD-10-CM | POA: Diagnosis not present

## 2016-07-01 DIAGNOSIS — I10 Essential (primary) hypertension: Secondary | ICD-10-CM | POA: Diagnosis not present

## 2016-07-01 DIAGNOSIS — I779 Disorder of arteries and arterioles, unspecified: Secondary | ICD-10-CM

## 2016-07-01 DIAGNOSIS — I739 Peripheral vascular disease, unspecified: Secondary | ICD-10-CM

## 2016-07-01 DIAGNOSIS — E7800 Pure hypercholesterolemia, unspecified: Secondary | ICD-10-CM

## 2016-07-01 DIAGNOSIS — I5032 Chronic diastolic (congestive) heart failure: Secondary | ICD-10-CM

## 2016-07-01 DIAGNOSIS — I447 Left bundle-branch block, unspecified: Secondary | ICD-10-CM

## 2016-07-01 MED ORDER — LOSARTAN POTASSIUM 50 MG PO TABS: 50.0000 mg | ORAL_TABLET | Freq: Two times a day (BID) | ORAL | 3 refills | Status: DC

## 2016-07-01 NOTE — Patient Instructions (Signed)
Medication Instructions:  Your physician recommends that you continue on your current medications as directed. Please refer to the Current Medication list given to you today.   Labwork: none  Testing/Procedures: Your physician has requested that you have a carotid duplex. This test is an ultrasound of the carotid arteries in your neck. It looks at blood flow through these arteries that supply the brain with blood. Allow one hour for this exam. There are no restrictions or special instructions.    Follow-Up: Your physician wants you to follow-up in: 12 months.  You will receive a reminder letter in the mail two months in advance. If you don't receive a letter, please call our office to schedule the follow-up appointment.   Any Other Special Instructions Will Be Listed Below (If Applicable).     If you need a refill on your cardiac medications before your next appointment, please call your pharmacy.

## 2016-07-04 ENCOUNTER — Other Ambulatory Visit: Payer: Self-pay | Admitting: Cardiovascular Disease

## 2016-07-05 DIAGNOSIS — E119 Type 2 diabetes mellitus without complications: Secondary | ICD-10-CM | POA: Diagnosis not present

## 2016-07-05 DIAGNOSIS — E782 Mixed hyperlipidemia: Secondary | ICD-10-CM | POA: Diagnosis not present

## 2016-07-05 DIAGNOSIS — E038 Other specified hypothyroidism: Secondary | ICD-10-CM | POA: Diagnosis not present

## 2016-07-07 DIAGNOSIS — Z23 Encounter for immunization: Secondary | ICD-10-CM | POA: Diagnosis not present

## 2016-07-07 DIAGNOSIS — I10 Essential (primary) hypertension: Secondary | ICD-10-CM | POA: Diagnosis not present

## 2016-07-07 DIAGNOSIS — I2510 Atherosclerotic heart disease of native coronary artery without angina pectoris: Secondary | ICD-10-CM | POA: Diagnosis not present

## 2016-07-07 DIAGNOSIS — E782 Mixed hyperlipidemia: Secondary | ICD-10-CM | POA: Diagnosis not present

## 2016-07-07 DIAGNOSIS — K219 Gastro-esophageal reflux disease without esophagitis: Secondary | ICD-10-CM | POA: Diagnosis not present

## 2016-07-07 DIAGNOSIS — E119 Type 2 diabetes mellitus without complications: Secondary | ICD-10-CM | POA: Diagnosis not present

## 2016-07-07 DIAGNOSIS — E038 Other specified hypothyroidism: Secondary | ICD-10-CM | POA: Diagnosis not present

## 2016-07-07 DIAGNOSIS — M810 Age-related osteoporosis without current pathological fracture: Secondary | ICD-10-CM | POA: Diagnosis not present

## 2016-07-14 ENCOUNTER — Other Ambulatory Visit: Payer: Self-pay | Admitting: Cardiovascular Disease

## 2016-07-22 ENCOUNTER — Ambulatory Visit (HOSPITAL_COMMUNITY)
Admission: RE | Admit: 2016-07-22 | Discharge: 2016-07-22 | Disposition: A | Discharge status: Home / Self Care | Payer: Medicare Other | Source: Ambulatory Visit | Attending: Cardiovascular Disease | Admitting: Cardiovascular Disease

## 2016-07-22 DIAGNOSIS — E785 Hyperlipidemia, unspecified: Secondary | ICD-10-CM | POA: Insufficient documentation

## 2016-07-22 DIAGNOSIS — I739 Peripheral vascular disease, unspecified: Secondary | ICD-10-CM

## 2016-07-22 DIAGNOSIS — Z87891 Personal history of nicotine dependence: Secondary | ICD-10-CM | POA: Diagnosis not present

## 2016-07-22 DIAGNOSIS — I10 Essential (primary) hypertension: Secondary | ICD-10-CM | POA: Insufficient documentation

## 2016-07-22 DIAGNOSIS — I779 Disorder of arteries and arterioles, unspecified: Secondary | ICD-10-CM

## 2016-07-22 DIAGNOSIS — I6523 Occlusion and stenosis of bilateral carotid arteries: Secondary | ICD-10-CM | POA: Insufficient documentation

## 2016-07-22 DIAGNOSIS — I2510 Atherosclerotic heart disease of native coronary artery without angina pectoris: Secondary | ICD-10-CM | POA: Insufficient documentation

## 2016-07-22 DIAGNOSIS — E119 Type 2 diabetes mellitus without complications: Secondary | ICD-10-CM | POA: Insufficient documentation

## 2016-07-22 DIAGNOSIS — Z951 Presence of aortocoronary bypass graft: Secondary | ICD-10-CM | POA: Diagnosis not present

## 2016-08-02 ENCOUNTER — Encounter: Payer: Self-pay | Admitting: Neurology

## 2016-08-02 ENCOUNTER — Ambulatory Visit (INDEPENDENT_AMBULATORY_CARE_PROVIDER_SITE_OTHER): Payer: Medicare Other | Admitting: Neurology

## 2016-08-02 VITALS — BP 130/62 | HR 62 | Resp 16 | Ht 62.0 in | Wt 193.0 lb

## 2016-08-02 DIAGNOSIS — G2581 Restless legs syndrome: Secondary | ICD-10-CM | POA: Diagnosis not present

## 2016-08-02 DIAGNOSIS — I25810 Atherosclerosis of coronary artery bypass graft(s) without angina pectoris: Secondary | ICD-10-CM | POA: Diagnosis not present

## 2016-08-02 MED ORDER — GABAPENTIN 100 MG PO CAPS: 100.0000 mg | ORAL_CAPSULE | Freq: Every evening | ORAL | 3 refills | Status: DC | PRN

## 2016-08-02 NOTE — Patient Instructions (Signed)
Please continue with your Requip.  Your sleep study showed light stage sleep and sleep disruption, no over sleep apnea.  Try to reduce chocolate intake.  As an adjunct, we will add as needed Neurontin (gabapentin) 100 mg strength: Take 1 pill at bedtime as needed. The most common side effects reported are sedation or sleepiness. Rare side effects include balance problems, confusion.

## 2016-08-02 NOTE — Progress Notes (Addendum)
Subjective:    Patient ID: [name] is a 82 y.o. female.  HPI     Interim history:   [name] is a very pleasant 82 year old right-handed woman with an underlying medical history of osteoporosis, breast cancer, status post left lumpectomy in March 2013, status post radiation therapy, history of coronary artery disease with status post 2 vessel CABG, type 2 diabetes, arthritis, diverticulitis, reflux disease, kidney stones, hyperlipidemia, hypertension, hypothyroidism, kidney stone, psoriasis, who presents for follow-up consultation of [name] RLS. The patient is accompanied by [name] son today. I first met [name] on 04/27/2016 at the request of [name] endocrinologist, at which time she reported a long-standing history of restless leg syndrome. She had been on ropinirole. I invited [name] for a sleep study. She had a baseline sleep study on 05/22/2016. I went over [name] test results with [name] in detail today. Sleep efficiency was markedly reduced at 42.4% with a short sleep latency but high wake after sleep onset. She had an increased percentage of stage II sleep, very small percentage of slow-wave sleep and absence of REM sleep. She had no significant sleep disordered breathing. Total AHI was 0.3 per hour. Average oxygen saturation was 98%, nadir was 83%. Time below 89% saturation was less than 1 minute. She did not have any significant PLMS.  Today, 08/02/2016: She reports RLS symptoms. She had tapered [name] Requip, as she came off of Horizant d/t SE. she has noted lower extremity swelling.   Previously:   04/27/2016: She reports a long-standing history restless leg syndrome. She has been on Requip/ropinirole. I reviewed your office note from 04/01/2016, which you kindly included. She has had symptoms of restless legs for over 25 years, with time she noted progression, symptoms started sometimes as early as 3 PM but it helps to stay active physically, some nights are worse than others, she is noted to twitch  [name] legs and [name] sleep quite vehemently at times per husband. She is not aware of any family history of restless legs. However, [name] son they have started having some symptoms she adds. She has been on Requip for years, probably over 10 or 15 years. She started with Brand name Requip and then switch to generic, always on 3 mg at night, she takes it around 7 PM. Sometimes it does not seem to help at all, in the beginning it helped better. She had open heart surgery in 2012, sees [name] cardiologist now once a year, appointment pending for October of this year. Of note, she snores, she has woken up with a sense of gasping and panic infrequently. [name] husband sleeps quite well, uses a CPAP machine. She has never had a sleep study. She does have daytime tiredness and fatigue as well as sleepiness sometimes. Epworth sleepiness score is 7 out of 24 today, [name] fatigue score is 39 out of 63. She has nocturia 2-3 times per night on average, denies morning headaches, does wake up with leg twitching fairly frequently and sometimes [name] legs twitch even when she is trying to walk. She has practically eliminated caffeine from [name] diet. She drinks alcohol infrequently and quit smoking many years ago, in 1989. She is retired from Engineer, [employer], they have 1 grown son.   [name] Past Medical History Is Significant For: Past Medical History:  Diagnosis Date  . Arthritis   . Breast cancer ([place]) 10/20/11   L breast, ER/PR+, HER2 -  . Breast cancer, left breast ([place]) 11/01/2011  . Bronchitis    hx of  .  CAD (coronary artery disease)    LHC 06/14/11: LAD 80% and 80% after Dx, Dx 80% (Dx was small), oOM 80%, mOM 70% (OM was small), oRCA 70-80%, mRCA 60-70%, EF 55% - referred for CABG;   Echo 06/14/11: mild focal basal septal hypertrophy, grade 1 diast dysfxn, mild LAE, mild RVE, PASP 37.    . Carotid stenosis    dopplers 51/88: LICA 41-66%  . Diabetes mellitus    x 5-6 yrs  . Diverticulitis of colon with perforation   . DM2 (diabetes  mellitus, type 2) ([place])   . GERD (gastroesophageal reflux disease)   . History of kidney stones   . HLD (hyperlipidemia)   . HTN (hypertension)   . Hypothyroidism   . Kidney stones   . Psoriasis   . RLS (restless legs syndrome)   . S/P CABG x 2 06/2011   Dr. Servando Snare (L[facility]LAD, [facility])  . S/P radiation therapy 12/20/11 - 01/10/12   Left Breast: 0630 cGy/ 16 Fractions    [name] Past Surgical History Is Significant For: Past Surgical History:  Procedure Laterality Date  . ABDOMINAL HYSTERECTOMY     age 61's  . APPENDECTOMY    . arthroscopy right knee    . breast biospy  11/04/11   Breast, Left, Needle Core Biopsy 6 Oclock, Benign Breast Tissue wtih Fibrocystic  Changes: Microcalcifications Identified, No Neoplasm or Malignancy Identified  . breast biospy  10/20/11   Breast, Left, Needle Core Biopsy LOQ: Invsive ductal Carcinoman  . BREAST LUMPECTOMY Left 11/22/11   Breast, Lumpectomy, Left with Sentinel Node Biopsy: Invasive High Grade Ductal Carcinoma : No lymphovascular Invasion: High  Grade Ductal Carcinoma In-Situ with Calcification and Comedo Necrosis  . CARDIAC CATHETERIZATION  2012  . COLON RESECTION  1977  . COLON SURGERY     part colectomy-  . COLONOSCOPY    . CORONARY ARTERY BYPASS GRAFT  10/12  . Off-pump coronary artery bypass grafting x2  06/16/2011  . SHOULDER ARTHROSCOPY WITH SUBACROMIAL DECOMPRESSION, ROTATOR CUFF REPAIR AND BICEP TENDON REPAIR Right 10/16/2014   Procedure: RIGHT SHOULDER ARTHROSCOPY WITH SUBACROMIAL DECOMPRESSION, DISTAL CLAVICLE RESECTION AND ROTATOR CUFF REPAIR ;  Surgeon: Marin Shutter, MD;  Location: [facility];  Service: Orthopedics;  Laterality: Right;  . [place]    [name] Family History Is Significant For: Family History  Problem Relation Age of Onset  . Cancer Mother     throat  . Heart disease Mother   . Heart disease Father   . Hypertension Brother   . Cancer Paternal Uncle     [name] Social History Is Significant For: Social  History   Social History  . Marital status: Married    Spouse name: N/A  . Number of children: 1  . Years of education: HS   Occupational History  . RETIRED-worked for an accounting firm Retired   Social History Main Topics  . Smoking status: Former Smoker    Packs/day: 1.00    Years: 6.00    Types: Cigarettes    Quit date: 09/05/1980  . Smokeless tobacco: Never Used  . Alcohol use Yes     Comment: socially  . Drug use: No  . Sexual activity: Not Asked     Comment: menarche 54, p2, 1 stillbirth, 1st preg age 59, HRT x many yrs   Other Topics Concern  . None   Social History Narrative   Denies caffeine use     [name] Allergies Are:  No Known Allergies:  [name] Current Medications Are:  Outpatient Encounter Prescriptions as of 08/02/2016  Medication Sig  . albuterol (PROVENTIL HFA;VENTOLIN HFA) 108 (90 BASE) MCG/ACT inhaler Inhale 1-2 puffs into the lungs every 6 (six) hours as needed for wheezing or shortness of breath.  . anastrozole (ARIMIDEX) 1 MG tablet TAKE 1 TABLET DAILY  . aspirin 81 MG tablet Take 81 mg by mouth daily.    . Biotin 5 MG CAPS Take 1 capsule by mouth daily.  Marland Kitchen CALCITRIOL PO Take 2 capsules by mouth daily.  . Calcium Carbonate (OS-CAL PO) Take 1 tablet by mouth daily.  . Canagliflozin (INVOKANA) 300 MG TABS Take 1 tablet by mouth every morning.   . cetirizine (ZYRTEC) 10 MG tablet Take 10 mg by mouth daily as needed (allergies).   . Cholecalciferol (VITAMIN D-3) 5000 UNITS TABS Take 1 tablet by mouth daily.  . CRESTOR 20 MG tablet Take 20 mg by mouth daily.   . diazepam (VALIUM) 5 MG tablet Take 0.5-1 tablets (2.5-5 mg total) by mouth every 6 (six) hours as needed for muscle spasms or sedation.  Marland Kitchen esomeprazole (NEXIUM) 40 MG capsule Take 40 mg by mouth daily before breakfast.    . ezetimibe (ZETIA) 10 MG tablet Take 10 mg by mouth daily.    . furosemide (LASIX) 40 MG tablet TAKE 1 TABLET DAILY.PATIENTNEEDS TO KEEP 07/01/16     APPOINTMENT FOR   FURTHER  REFILLS.  . Gabapentin Enacarbil ER (HORIZANT) 300 MG TBCR Take 300 mg by mouth at bedtime.  . Glucosamine-Chondroitin (GLUCOSAMINE CHONDR COMPLEX PO) Take 1 capsule by mouth 2 (two) times daily.  Marland Kitchen ibuprofen (ADVIL,MOTRIN) 200 MG tablet Take 400 mg by mouth every 6 (six) hours as needed for moderate pain (shoulder pain).  Marland Kitchen KLOR-CON M10 10 MEQ tablet TAKE 1 TABLET DAILY  . levothyroxine (SYNTHROID, LEVOTHROID) 137 MCG tablet Take 137 mcg by mouth daily before breakfast.  . losartan (COZAAR) 50 MG tablet Take 1 tablet (50 mg total) by mouth 2 (two) times daily.  . metoprolol (LOPRESSOR) 50 MG tablet Take 1.5 tablets (75 mg total) by mouth 2 (two) times daily. Please keep 07/01/16 appt for further refills  . Multiple Vitamin (MULTIVITAMIN) capsule Take 1 capsule by mouth daily.    . Multiple Vitamins-Minerals (EYE VITAMINS) CAPS Take 1 capsule by mouth daily.   . pioglitazone (ACTOS) 15 MG tablet Take 15 mg by mouth daily.  Marland Kitchen rOPINIRole (REQUIP) 3 MG tablet Take 3 mg by mouth at bedtime.   . [DISCONTINUED] furosemide (LASIX) 40 MG tablet Take 1 tablet (40 mg total) by mouth daily. Please keep 07/01/16 appt for further refills.   No facility-administered encounter medications on file as of 08/02/2016.   :  Review of Systems:  Out of a complete 14 point review of systems, all are reviewed and negative with the exception of these symptoms as listed below: Review of Systems  Neurological:       Patient is here to discuss sleep study. States that she is having trouble with restless legs at night.     Objective:  Neurologic Exam  Physical Exam Physical Examination:   Vitals:   08/02/16 1015  BP: 130/62  Pulse: 62  Resp: 16   General Examination: The patient is a very pleasant 82 y.o. female in no acute distress. She appears well-developed and well-nourished and well groomed.   HEENT: Normocephalic, atraumatic, pupils are equal, round and reactive to light and accommodation. Funduscopic  exam is normal with sharp disc margins  noted. Extraocular tracking is good without limitation to gaze excursion or nystagmus noted. Normal smooth pursuit is noted. Hearing is grossly intact. Tympanic membranes are clear bilaterally. Face is symmetric with normal facial animation and normal facial sensation. Speech is clear with no dysarthria noted. There is no hypophonia. There is no lip, neck/head, jaw or voice tremor. Neck is supple with full range of passive and active motion. There are no carotid bruits on auscultation. Oropharynx exam reveals: mild mouth dryness, adequate dental hygiene and moderate airway crowding, due to small airway entry and redundant soft palate, tonsils are not fully visualized. Mallampati is class II. Tongue protrudes centrally and palate elevates symmetrically. Neck size is 16.5 inches. She has a Mild overbite.    Chest: Clear to auscultation without wheezing, rhonchi or crackles noted.  Heart: S1+S2+0, regular and normal without murmurs, rubs or gallops noted.   Abdomen: Soft, non-tender and non-distended with normal bowel sounds appreciated on auscultation.  Extremities: There is 1-2+ pitting edema in the distal lower extremities bilaterally.  Skin: Warm and dry without trophic changes noted. There are no varicose veins.  Musculoskeletal: exam reveals no obvious joint deformities, tenderness or joint swelling or erythema.   Neurologically:  Mental status: The patient is awake, alert and oriented in all 4 spheres. [name] immediate and remote memory, attention, language skills and fund of knowledge are appropriate. There is no evidence of aphasia, agnosia, apraxia or anomia. Speech is clear with normal prosody and enunciation. Thought process is linear. Mood is normal and affect is normal.  Cranial nerves II - XII are as described above under HEENT exam. In addition: shoulder shrug is normal with equal shoulder height noted. Motor exam: Normal bulk, strength and tone is  noted. There is no drift, tremor or rebound. Romberg is negative. Reflexes are 2+ throughout. Babinski: Toes are flexor bilaterally. Fine motor skills and coordination: intact with normal finger taps, normal hand movements, normal rapid alternating patting, normal foot taps and normal foot agility.  Cerebellar testing: No dysmetria or intention tremor on finger to nose testing. There is no truncal or gait ataxia.  Sensory exam: stable with decreased in the distal lower extremities bilaterally.  Gait, station and balance: She stands easily. No veering to one side is noted. No leaning to one side is noted. Posture is age-appropriate and stance is narrow based. Gait shows normal stride length and normal pace. No problems turning are noted. Slight limp on the right but she has no pain and does not feel she is limping.     Assessment and Plan:   In summary, SOLARIS KRAM is a very pleasant 82 year old female with an underlying medical history of osteoporosis, breast cancer, status post left lumpectomy in March 2013, status post radiation therapy, history of coronary artery disease with status post 2 vessel CABG, type 2 diabetes, arthritis, diverticulitis, reflux disease, kidney stones, hyperlipidemia, hypertension, hypothyroidism, kidney stone, psoriasis, who presents for Follow-up consultation of [name] long-standing history of restless leg syndrome. She has been on ropinirole for several years, about 10 years. She has been on 3 mg of ropinirole generic. She has with time noticed progression in [name] symptoms. While she does have a fairly nonfocal neurological exam, she does have evidence of mild peripheral neuropathy. She had a baseline sleep study a couple months ago on 05/22/2016 which did not show any significant sleep disordered breathing, however, sleep was quite disrupted, poorly consolidated, and sleep efficiency of only 42.4%, she did not achieve much in the  way of slow-wave sleep and no dream sleep which  could've resulted in underestimation of [name] sleep disordered breathing. She had no significant PLMS but there is known night to night variability for PLMS and also she has been on medication for symptomatically treatment of [name] restless legs. She has lower extremity swelling which could in part be due to taking ropinirole. I would not recommend increasing the dose at this time, we can continue with the current dose but as an adjunct I would like to add gabapentin 100 mg at night as needed. Unfortunately, she had side effects from the horizant 300 mg each night, including next a grogginess and she also tapered [name] Requip down at the time which she was actually supposed to continue at the dose of 3 mg. Nevertheless, she did not have a good experience with this new medication and therefore we mutually agreed to stay away from it. She is agreeable to trying gabapentin and low-dose. I talked [name] about potential side effects and this will be an adjunct on an as-needed basis. She is encouraged to keep a log of [name] symptoms as well. She is encouraged to reduce [name] chocolate intake and use milkshakes really only as treats maybe once a month or so. She is encouraged to drink more water for hydration. Physical exam and neurological exam are otherwise stable, we talked about [name] sleep study results in detail as well today. I would like to see [name] back in 3 months, provided a new prescription for gabapentin and answered all their questions today.  I spent 25 minutes in total face-to-face time with the patient, more than 50% of which was spent in counseling and coordination of care, reviewing test results, reviewing medication and discussing or reviewing the diagnosis of RLS and PLMs, the prognosis and treatment options.

## 2016-08-11 DIAGNOSIS — E119 Type 2 diabetes mellitus without complications: Secondary | ICD-10-CM | POA: Diagnosis not present

## 2016-08-11 DIAGNOSIS — Z961 Presence of intraocular lens: Secondary | ICD-10-CM | POA: Diagnosis not present

## 2016-08-11 DIAGNOSIS — H524 Presbyopia: Secondary | ICD-10-CM | POA: Diagnosis not present

## 2016-08-11 DIAGNOSIS — H353131 Nonexudative age-related macular degeneration, bilateral, early dry stage: Secondary | ICD-10-CM | POA: Diagnosis not present

## 2016-08-24 ENCOUNTER — Other Ambulatory Visit: Payer: Self-pay | Admitting: Cardiovascular Disease

## 2016-09-16 ENCOUNTER — Other Ambulatory Visit: Payer: Self-pay | Admitting: Cardiovascular Disease

## 2016-09-26 ENCOUNTER — Ambulatory Visit (INDEPENDENT_AMBULATORY_CARE_PROVIDER_SITE_OTHER): Payer: Medicare Other | Admitting: Podiatry

## 2016-09-26 ENCOUNTER — Ambulatory Visit (INDEPENDENT_AMBULATORY_CARE_PROVIDER_SITE_OTHER): Payer: Medicare Other

## 2016-09-26 ENCOUNTER — Encounter: Payer: Self-pay | Admitting: Podiatry

## 2016-09-26 DIAGNOSIS — E1149 Type 2 diabetes mellitus with other diabetic neurological complication: Secondary | ICD-10-CM | POA: Diagnosis not present

## 2016-09-26 DIAGNOSIS — L84 Corns and callosities: Secondary | ICD-10-CM | POA: Diagnosis not present

## 2016-09-26 DIAGNOSIS — B351 Tinea unguium: Secondary | ICD-10-CM

## 2016-09-26 DIAGNOSIS — M79675 Pain in left toe(s): Secondary | ICD-10-CM | POA: Diagnosis not present

## 2016-09-26 DIAGNOSIS — L03115 Cellulitis of right lower limb: Secondary | ICD-10-CM

## 2016-09-26 DIAGNOSIS — M79674 Pain in right toe(s): Secondary | ICD-10-CM | POA: Diagnosis not present

## 2016-09-26 DIAGNOSIS — L97511 Non-pressure chronic ulcer of other part of right foot limited to breakdown of skin: Secondary | ICD-10-CM | POA: Diagnosis not present

## 2016-09-26 MED ORDER — CEPHALEXIN 500 MG PO CAPS: 500.0000 mg | ORAL_CAPSULE | Freq: Three times a day (TID) | ORAL | 2 refills | Status: DC

## 2016-09-26 MED ORDER — MUPIROCIN 2 % EX OINT: 1.0000 "application " | TOPICAL_OINTMENT | Freq: Two times a day (BID) | CUTANEOUS | 0 refills | Status: AC

## 2016-09-26 NOTE — Progress Notes (Signed)
   Subjective:    Patient ID: [name], female    DOB: 21-Feb-1933, 83 y.o.   MRN: [id number]  HPI  83 year old female presents the office today for concerns of a wound/callus to the right bunion site which is been ongoing about 3 months. She states that she has had no treatment for this. She's been putting some antibiotic ointment along the area but this continues to breakdown causing an ulcer which does get tender. She denies any surrounding redness or red streaks and she denies any drainage or pus.  She is also a spur nails be trimmed as they are thick, painful, elongated she cannot trim them herself. Denies any redness or drainage coming from the nail sites.  Review of Systems  Cardiovascular: Positive for leg swelling.  Skin:       Open sore on the side of my right foot       Objective:   Physical Exam General: AAO x3, NAD  Dermatological: On the right side along the bunion prominence is a hyperkeratotic lesion. Upon debridement there is an underlying granular ulceration measuring 0.2 x 0.2 x 0.1 cm. There is no probing to bone, undermining or tunneling. There is faint surrounding redness but there is no ascending cellulitis. There is no flexor crepitus there is no malodor. There were no other open lesions. The nails are hypertrophic, dystrophic, discolored, elongated 10. Tenderness nails 1-5 bilaterally. Denies any redness or drainage.  Vascular: Dorsalis Pedis artery and Posterior Tibial artery pedal pulses are 2/4 bilateral with immedate capillary fill time. There is no pain with calf compression, swelling, warmth, erythema.   Neruologic: Sensation decreased with Derrel Nip monofilament.   Musculoskeletal: Bunion is present bilaterally. Muscular strength 5/5 in all groups tested bilateral.      Assessment & Plan:  83 year old female right ulceration along bunion site with localized infection; symptomatic onychomycosis -Treatment options discussed including all  alternatives, risks, and complications -Etiology of symptoms were discussed -X-rays were obtained and reviewed with the patient. No definitive evidence of acute osteomyelitis identified at this time. No soft tissue emphysema is present. -Wound sharply debrided to the scalpel done a granular healthy tissue. We'll start Keflex. Much brace as her symptoms of worsening infection of the ER call the office and medially should any occur. Offloading pads were dispensed -Nail sharply debrided 10 without complications or bleeding -Daily foot inspection -Follow-up as scheduled or sooner if needed.  Celesta Gentile, DPM

## 2016-10-06 DIAGNOSIS — E782 Mixed hyperlipidemia: Secondary | ICD-10-CM | POA: Diagnosis not present

## 2016-10-06 DIAGNOSIS — E039 Hypothyroidism, unspecified: Secondary | ICD-10-CM | POA: Diagnosis not present

## 2016-10-06 DIAGNOSIS — E119 Type 2 diabetes mellitus without complications: Secondary | ICD-10-CM | POA: Diagnosis not present

## 2016-10-10 ENCOUNTER — Ambulatory Visit (INDEPENDENT_AMBULATORY_CARE_PROVIDER_SITE_OTHER): Payer: Medicare Other | Admitting: Podiatry

## 2016-10-10 ENCOUNTER — Encounter: Payer: Self-pay | Admitting: Podiatry

## 2016-10-10 VITALS — BP 133/55 | HR 55 | Resp 18

## 2016-10-10 DIAGNOSIS — L84 Corns and callosities: Secondary | ICD-10-CM | POA: Diagnosis not present

## 2016-10-10 DIAGNOSIS — E1149 Type 2 diabetes mellitus with other diabetic neurological complication: Secondary | ICD-10-CM

## 2016-10-10 NOTE — Progress Notes (Signed)
Subjective: Ms. Meurer presents to the office today for follow up evaluation of a wound on the right foot.  She states that she is doing much better and the wound closed as soon as she started the antibiotics. She has been using the silvadene cream on the wound daily. She denies any redness, drainage, swelling.  Denies any systemic complaints such as fevers, chills, nausea, vomiting. No acute changes since last appointment, and no other complaints at this time.   Objective: AAO x3, NAD DP/PT pulses palpable bilaterally, CRT less than 3 seconds There is a hyperkeratotic lesion over the medial aspect of the right hallux  Over the area of the previous ulceration. Upon debridement there is no underlying ulceration, drainage, signs of infection. There is no fluctuance or crepitance. No malodor.  HAV present bilateral  No open lesions or pre-ulcerative lesions.  No pain with calf compression, swelling, warmth, erythema  Assessment: Healed ulcer right hallux  Plan: -All treatment options discussed with the patient including all alternatives, risks, complications.  -Hyperkeratotic lesion was sharply debrided x 1 without complications. The underlying ulceration appears healed. The area is pre-ulcerative. If there is any skin breakdown to call the office -Follow-up in 9 weeks or sooner if any problems arise. In the meantime, encouraged to call the office with any questions, concerns, change in symptoms.   Celesta Gentile, DPM

## 2016-10-11 DIAGNOSIS — I10 Essential (primary) hypertension: Secondary | ICD-10-CM | POA: Diagnosis not present

## 2016-10-11 DIAGNOSIS — M810 Age-related osteoporosis without current pathological fracture: Secondary | ICD-10-CM | POA: Diagnosis not present

## 2016-10-11 DIAGNOSIS — K219 Gastro-esophageal reflux disease without esophagitis: Secondary | ICD-10-CM | POA: Diagnosis not present

## 2016-10-11 DIAGNOSIS — E039 Hypothyroidism, unspecified: Secondary | ICD-10-CM | POA: Diagnosis not present

## 2016-10-11 DIAGNOSIS — I2510 Atherosclerotic heart disease of native coronary artery without angina pectoris: Secondary | ICD-10-CM | POA: Diagnosis not present

## 2016-10-11 DIAGNOSIS — E782 Mixed hyperlipidemia: Secondary | ICD-10-CM | POA: Diagnosis not present

## 2016-10-11 DIAGNOSIS — E119 Type 2 diabetes mellitus without complications: Secondary | ICD-10-CM | POA: Diagnosis not present

## 2016-10-11 DIAGNOSIS — Z7984 Long term (current) use of oral hypoglycemic drugs: Secondary | ICD-10-CM | POA: Diagnosis not present

## 2016-11-02 DIAGNOSIS — M79604 Pain in right leg: Secondary | ICD-10-CM | POA: Diagnosis not present

## 2016-11-03 ENCOUNTER — Encounter: Payer: Self-pay | Admitting: Neurology

## 2016-11-03 ENCOUNTER — Ambulatory Visit (INDEPENDENT_AMBULATORY_CARE_PROVIDER_SITE_OTHER): Payer: Medicare Other | Admitting: Neurology

## 2016-11-03 VITALS — BP 156/60 | HR 61 | Resp 20 | Ht 64.0 in | Wt 192.0 lb

## 2016-11-03 DIAGNOSIS — G2581 Restless legs syndrome: Secondary | ICD-10-CM | POA: Diagnosis not present

## 2016-11-03 DIAGNOSIS — Z9181 History of falling: Secondary | ICD-10-CM

## 2016-11-03 NOTE — Progress Notes (Signed)
Subjective:    Patient ID: [name] is a 83 y.o. female.  HPI     Interim history:   [name] is a very pleasant 83 year old right-handed woman with an underlying medical history of osteoporosis, breast cancer, status post left lumpectomy in March 2013, status post radiation therapy, history of coronary artery disease with status post 2 vessel CABG, type 2 diabetes, arthritis, diverticulitis, reflux disease, kidney stones, hyperlipidemia, hypertension, hypothyroidism, kidney stone, psoriasis, who presents for follow-up consultation of her RLS. The patient is accompanied by her husband today. I last saw her on 08/02/2016, at which time she reported that she could not tolerate Horizant, but she had also tapered off her ropinirole. I suggested that she continue with her ropinirole and that we add gabapentin as an adjunct. We talked about her sleep study results from 05/22/2016 at the time.   Today, 11/03/2016: She reports doing okay with respect to RLS, avoiding caffeine seems to help. She takes the requip 3 mg at night and prn gabapentin 100 mg only if she feels like she has been on her feet a lot that day. Unfortunately, she had a fall yesterday, in the parking lot, was in a hurry, trying to get her brother's walker out of the trunk of the car, turned too quickly and twisted R leg, landed on the ground, but did not hit back or head. Went to UC and had X ray, was told to ice it and use ibuprofen. still feels that the right distal leg is a little stiff and sore but otherwise no obvious swelling or bruise.    Previously:  I first met her on 04/27/2016 at the request of her endocrinologist, at which time she reported a long-standing history of restless leg syndrome. She had been on ropinirole. I invited her for a sleep study. She had a baseline sleep study on 05/22/2016. I went over her test results with her in detail today. Sleep efficiency was markedly reduced at 42.4% with a short sleep  latency but high wake after sleep onset. She had an increased percentage of stage II sleep, very small percentage of slow-wave sleep and absence of REM sleep. She had no significant sleep disordered breathing. Total AHI was 0.3 per hour. Average oxygen saturation was 98%, nadir was 83%. Time below 89% saturation was less than 1 minute. She did not have any significant PLMS.  04/27/2016: She reports a long-standing history restless leg syndrome. She has been on Requip/ropinirole. I reviewed your office note from 04/01/2016, which you kindly included. She has had symptoms of restless legs for over 25 years, with time she noted progression, symptoms started sometimes as early as 3 PM but it helps to stay active physically, some nights are worse than others, she is noted to twitch her legs and her sleep quite vehemently at times per husband. She is not aware of any family history of restless legs. However, her son they have started having some symptoms she adds. She has been on Requip for years, probably over 10 or 15 years. She started with Brand name Requip and then switch to generic, always on 3 mg at night, she takes it around 7 PM. Sometimes it does not seem to help at all, in the beginning it helped better. She had open heart surgery in 2012, sees her cardiologist now once a year, appointment pending for October of this year. Of note, she snores, she has woken up with a sense of gasping and panic infrequently. Her husband  sleeps quite well, uses a CPAP machine. She has never had a sleep study. She does have daytime tiredness and fatigue as well as sleepiness sometimes. Epworth sleepiness score is 7 out of 24 today, her fatigue score is 39 out of 63. She has nocturia 2-3 times per night on average, denies morning headaches, does wake up with leg twitching fairly frequently and sometimes her legs twitch even when she is trying to walk. She has practically eliminated caffeine from her diet. She drinks alcohol  infrequently and quit smoking many years ago, in 1989. She is retired from Engineer, [employer], they have 1 grown son.  Her Past Medical History Is Significant For: Past Medical History:  Diagnosis Date  . Arthritis   . Breast cancer ([place]) 10/20/11   L breast, ER/PR+, HER2 -  . Breast cancer, left breast ([place]) 11/01/2011  . Bronchitis    hx of  . CAD (coronary artery disease)    LHC 06/14/11: LAD 80% and 80% after Dx, Dx 80% (Dx was small), oOM 80%, mOM 70% (OM was small), oRCA 70-80%, mRCA 60-70%, EF 55% - referred for CABG;   Echo 06/14/11: mild focal basal septal hypertrophy, grade 1 diast dysfxn, mild LAE, mild RVE, PASP 37.    . Carotid stenosis    dopplers 09/38: LICA 18-29%  . Diabetes mellitus    x 5-6 yrs  . Diverticulitis of colon with perforation   . DM2 (diabetes mellitus, type 2) ([place])   . GERD (gastroesophageal reflux disease)   . History of kidney stones   . HLD (hyperlipidemia)   . HTN (hypertension)   . Hypothyroidism   . Kidney stones   . Psoriasis   . RLS (restless legs syndrome)   . S/P CABG x 2 06/2011   Dr. Servando Snare ([facility]AD, [facility])  . S/P radiation therapy 12/20/11 - 01/10/12   Left Breast: 9371 cGy/ 16 Fractions    Her Past Surgical History Is Significant For: Past Surgical History:  Procedure Laterality Date  . ABDOMINAL HYSTERECTOMY     age 22's  . APPENDECTOMY    . arthroscopy right knee    . breast biospy  11/04/11   Breast, Left, Needle Core Biopsy 6 Oclock, Benign Breast Tissue wtih Fibrocystic  Changes: Microcalcifications Identified, No Neoplasm or Malignancy Identified  . breast biospy  10/20/11   Breast, Left, Needle Core Biopsy LOQ: Invsive ductal Carcinoman  . BREAST LUMPECTOMY Left 11/22/11   Breast, Lumpectomy, Left with Sentinel Node Biopsy: Invasive High Grade Ductal Carcinoma : No lymphovascular Invasion: High  Grade Ductal Carcinoma In-Situ with Calcification and Comedo Necrosis  . CARDIAC CATHETERIZATION  2012  . COLON RESECTION  1977  . COLON  SURGERY     part colectomy-  . COLONOSCOPY    . CORONARY ARTERY BYPASS GRAFT  10/12  . Off-pump coronary artery bypass grafting x2  06/16/2011  . SHOULDER ARTHROSCOPY WITH SUBACROMIAL DECOMPRESSION, ROTATOR CUFF REPAIR AND BICEP TENDON REPAIR Right 10/16/2014   Procedure: RIGHT SHOULDER ARTHROSCOPY WITH SUBACROMIAL DECOMPRESSION, DISTAL CLAVICLE RESECTION AND ROTATOR CUFF REPAIR ;  Surgeon: Marin Shutter, MD;  Location: [place];  Service: Orthopedics;  Laterality: Right;  . [place]    Her Family History Is Significant For: Family History  Problem Relation Age of Onset  . Cancer Mother     throat  . Heart disease Mother   . Heart disease Father   . Hypertension Brother   . Cancer Paternal Uncle     Her Social  History Is Significant For: Social History   Social History  . Marital status: Married    Spouse name: N/A  . Number of children: 1  . Years of education: HS   Occupational History  . RETIRED-worked for an accounting firm Retired   Social History Main Topics  . Smoking status: Former Smoker    Packs/day: 1.00    Years: 6.00    Types: Cigarettes    Quit date: 09/05/1980  . Smokeless tobacco: Never Used  . Alcohol use Yes     Comment: socially  . Drug use: No  . Sexual activity: Not Asked     Comment: menarche 29, p2, 1 stillbirth, 1st preg age 83, HRT x many yrs   Other Topics Concern  . None   Social History Narrative   Denies caffeine use     Her Allergies Are:  No Known Allergies:   Her Current Medications Are:  Outpatient Encounter Prescriptions as of 11/03/2016  Medication Sig  . albuterol (PROVENTIL HFA;VENTOLIN HFA) 108 (90 BASE) MCG/ACT inhaler Inhale 1-2 puffs into the lungs every 6 (six) hours as needed for wheezing or shortness of breath.  . anastrozole (ARIMIDEX) 1 MG tablet TAKE 1 TABLET DAILY  . aspirin 81 MG tablet Take 81 mg by mouth daily.    . Biotin 5 MG CAPS Take 1 capsule by mouth daily.  Marland Kitchen CALCITRIOL PO Take 2  capsules by mouth daily.  . Calcium Carbonate (OS-CAL PO) Take 1 tablet by mouth daily.  . Canagliflozin (INVOKANA) 300 MG TABS Take 1 tablet by mouth every morning.   . Cholecalciferol (VITAMIN D-3) 5000 UNITS TABS Take 1 tablet by mouth daily.  . CRESTOR 20 MG tablet Take 20 mg by mouth daily.   . diazepam (VALIUM) 5 MG tablet Take 0.5-1 tablets (2.5-5 mg total) by mouth every 6 (six) hours as needed for muscle spasms or sedation.  Marland Kitchen esomeprazole (NEXIUM) 40 MG capsule Take 40 mg by mouth daily before breakfast.    . ezetimibe (ZETIA) 10 MG tablet Take 10 mg by mouth daily.    . furosemide (LASIX) 40 MG tablet TAKE 1 TABLET DAILY.PATIENTNEEDS TO KEEP 07/01/16     APPOINTMENT FOR   FURTHER REFILLS.  Marland Kitchen gabapentin (NEURONTIN) 100 MG capsule Take 1 capsule (100 mg total) by mouth at bedtime as needed.  . Glucosamine-Chondroitin (GLUCOSAMINE CHONDR COMPLEX PO) Take 1 capsule by mouth 2 (two) times daily.  Marland Kitchen ibuprofen (ADVIL,MOTRIN) 200 MG tablet Take 400 mg by mouth every 6 (six) hours as needed for moderate pain (shoulder pain).  Marland Kitchen KLOR-CON M10 10 MEQ tablet TAKE 1 TABLET DAILY  . levothyroxine (SYNTHROID, LEVOTHROID) 137 MCG tablet Take 137 mcg by mouth daily before breakfast.  . losartan (COZAAR) 50 MG tablet Take 1 tablet (50 mg total) by mouth 2 (two) times daily.  . metoprolol (LOPRESSOR) 50 MG tablet Take 1.5 tablets (75 mg total) by mouth 2 (two) times daily. Please keep 07/01/16 appt for further refills  . Multiple Vitamin (MULTIVITAMIN) capsule Take 1 capsule by mouth daily.    . Multiple Vitamins-Minerals (EYE VITAMINS) CAPS Take 1 capsule by mouth daily.   . mupirocin ointment (BACTROBAN) 2 % Apply 1 application topically 2 (two) times daily.  . pioglitazone (ACTOS) 15 MG tablet Take 15 mg by mouth daily.  . potassium chloride (KLOR-CON M10) 10 MEQ tablet Take 1 tablet (10 mEq total) by mouth daily.  Marland Kitchen rOPINIRole (REQUIP) 3 MG tablet Take 3 mg by mouth at bedtime.   . [  DISCONTINUED]  cephALEXin (KEFLEX) 500 MG capsule Take 1 capsule (500 mg total) by mouth 3 (three) times daily.  . [DISCONTINUED] cetirizine (ZYRTEC) 10 MG tablet Take 10 mg by mouth daily as needed (allergies).   . [DISCONTINUED] Gabapentin Enacarbil ER (HORIZANT) 300 MG TBCR Take 300 mg by mouth at bedtime.  . [DISCONTINUED] metoprolol (LOPRESSOR) 50 MG tablet FOR DIRECTIONS ON HOW TO   TAKE THIS MEDICINE, READ   THE ENCLOSED MEDICATION    INFORMATION FORM   No facility-administered encounter medications on file as of 11/03/2016.   :  Review of Systems:  Out of a complete 14 point review of systems, all are reviewed and negative with the exception of these symptoms as listed below:  Review of Systems  Neurological:       Pt presents today to discuss her restless legs. Pt had a fall yesterday and hurt her knee. Pt was seen at urgent care.    Objective:  Neurologic Exam  Physical Exam Physical Examination:   Vitals:   11/03/16 1300  BP: (!) 156/60  Pulse: 61  Resp: 20    General Examination: The patient is a very pleasant 83 y.o. female in no acute distress. She appears well-developed and well-nourished and very well groomed.   HEENT: Normocephalic, atraumatic, pupils are equal, round and reactive to light and accommodation. Funduscopic exam is normal with sharp disc margins noted. She is status post bilateral cataract repairs. Extraocular tracking is good without limitation to gaze excursion or nystagmus noted. Normal smooth pursuit is noted. Hearing is grossly intact. Face is symmetric with normal facial animation and normal facial sensation. Speech is clear with no dysarthria noted. There is no hypophonia. There is no lip, neck/head, jaw or voice tremor. Neck is supple with full range of passive and active motion. There are no carotid bruits on auscultation. Oropharynx exam reveals: mild mouth dryness, adequate dental hygiene and moderate airway crowding. Mallampati is class II. Tongue protrudes  centrally and palate elevates symmetrically.   Chest: Clear to auscultation without wheezing, rhonchi or crackles noted.  Heart: S1+S2+0, regular and normal without murmurs, rubs or gallops noted.   Abdomen: Soft, non-tender and non-distended with normal bowel sounds appreciated on auscultation.  Extremities: There is 1+ pitting edema in the distal lower extremities bilaterally. She has no obvious swelling in her right distal leg, no bruises noted, no tightness in her calf.  Skin: Warm and dry without trophic changes noted.   Musculoskeletal: exam reveals no obvious joint deformities, tenderness or joint swelling or erythema.   Neurologically:  Mental status: The patient is awake, alert and oriented in all 4 spheres. Her immediate and remote memory, attention, language skills and fund of knowledge are appropriate. There is no evidence of aphasia, agnosia, apraxia or anomia. Speech is clear with normal prosody and enunciation. Thought process is linear. Mood is normal and affect is normal.  Cranial nerves II - XII are as described above under HEENT exam. In addition: shoulder shrug is normal with equal shoulder height noted. Motor exam: Normal bulk, strength and tone is noted. There is no drift, tremor or rebound. Romberg is negative. Reflexes are 1-2+ throughout. Fine motor skills and coordination: intact with normal finger taps, normal hand movements, normal rapid alternating patting, normal foot taps and normal foot agility.  Cerebellar testing: No dysmetria or intention tremor on finger to nose testing. Heel to shin is unremarkable bilaterally. There is no truncal or gait ataxia.  Sensory exam: intact to light touch in  the upper and lower extremities.  Gait, station and balance: She stands easily. No veering to one side is noted. No leaning to one side is noted. Posture is age-appropriate and stance is narrow based. Gait shows slightly cautious gait and limp on the right.   Assessment and  Plan:   In summary, [name] is a very pleasant 83 y.o.-year old female with an underlying medical history of breast cancer, status post lumpectomy in March 2013, radiation therapy, on Arimidex in her fifth year, history of osteoporosis with pending DEXA scan next month, history of coronary artery disease with status post 2 vessel CABG, type 2 diabetes, arthritis, diverticulitis, reflux disease, kidney stones, hyperlipidemia, overweight state, psoriasis, hypothyroidism, and hypertension, who presents for follow-up consultation of her RLS. She has maintained treatment with ropinirole for years, probably about 10 years per PCP. She tried Horizant but had side effects, in November 2017 I suggested she try low-dose as needed gabapentin 100 mg strength at night. She uses this infrequently and believes it has helped. Avoiding caffeine also seems to help. She continues to take ropinirole 3 mg around 7:30 PM and goes to bed typically around 9:30 PM. Ropinirole does make her drowsy. She had a baseline sleep study in September 2017 which did not show any significant sleep disordered breathing, sleep was disrupted and poorly consolidated however and sleep efficiency was only 42.4%. She did not achieve REM sleep. We've previously talked about her sleep study results in detail. At this juncture, she is advised to continue with her medication regimen for her RLS, she feels stable and reasonably well treated. She had a fall yesterday but thankfully had no major injuries. Her right leg is sore but has no obvious changes in terms of swelling or bruising or mobility issues. She is advised regarding fall risk and is advised to be cautious and to slow down.  I suggested a one-year checkup, sooner as needed. I answered all her questions today and the patient and her husband were in agreement.   she did not need a refill on gabapentin, her Requip prescription is provided by her PCP. I spent 25 minutes in total face-to-face  time with the patient, more than 50% of which was spent in counseling and coordination of care, reviewing test results, reviewing medication and discussing or reviewing the diagnosis of RLS, fall risk, prognosis and treatment options. Pertinent laboratory and imaging test results that were available during this visit with the patient were reviewed by me and considered in my medical decision making (see chart for details).

## 2016-11-03 NOTE — Patient Instructions (Signed)
We can continue with your Requip 3 mg each evening (per PCP) and gabapentin 100 mg at night as needed.   I will see you back in 1 year.   Please be careful with your walking, fall risk is real, take your time.

## 2016-11-09 ENCOUNTER — Ambulatory Visit: Payer: Medicare Other | Admitting: Nurse Practitioner

## 2016-11-21 DIAGNOSIS — R0602 Shortness of breath: Secondary | ICD-10-CM | POA: Diagnosis not present

## 2016-11-21 DIAGNOSIS — E119 Type 2 diabetes mellitus without complications: Secondary | ICD-10-CM | POA: Diagnosis not present

## 2016-11-21 DIAGNOSIS — J40 Bronchitis, not specified as acute or chronic: Secondary | ICD-10-CM | POA: Diagnosis not present

## 2016-12-09 ENCOUNTER — Other Ambulatory Visit: Payer: Self-pay | Admitting: Oncology

## 2016-12-09 ENCOUNTER — Other Ambulatory Visit: Payer: Self-pay | Admitting: *Deleted

## 2016-12-09 MED ORDER — ANASTROZOLE 1 MG PO TABS: 1.0000 mg | ORAL_TABLET | Freq: Every day | ORAL | 2 refills | Status: DC

## 2016-12-12 ENCOUNTER — Ambulatory Visit (INDEPENDENT_AMBULATORY_CARE_PROVIDER_SITE_OTHER): Payer: Medicare Other | Admitting: Podiatry

## 2016-12-12 ENCOUNTER — Ambulatory Visit: Payer: Medicare Other | Admitting: Podiatry

## 2016-12-12 ENCOUNTER — Encounter: Payer: Self-pay | Admitting: Podiatry

## 2016-12-12 VITALS — BP 139/63 | HR 65

## 2016-12-12 DIAGNOSIS — M79675 Pain in left toe(s): Secondary | ICD-10-CM | POA: Diagnosis not present

## 2016-12-12 DIAGNOSIS — L97511 Non-pressure chronic ulcer of other part of right foot limited to breakdown of skin: Secondary | ICD-10-CM | POA: Diagnosis not present

## 2016-12-12 DIAGNOSIS — M79674 Pain in right toe(s): Secondary | ICD-10-CM | POA: Diagnosis not present

## 2016-12-12 DIAGNOSIS — B351 Tinea unguium: Secondary | ICD-10-CM | POA: Diagnosis not present

## 2016-12-12 DIAGNOSIS — E1149 Type 2 diabetes mellitus with other diabetic neurological complication: Secondary | ICD-10-CM | POA: Diagnosis not present

## 2016-12-13 ENCOUNTER — Other Ambulatory Visit: Payer: Self-pay

## 2016-12-13 MED ORDER — ANASTROZOLE 1 MG PO TABS: 1.0000 mg | ORAL_TABLET | Freq: Every day | ORAL | 2 refills | Status: DC

## 2016-12-14 NOTE — Progress Notes (Signed)
Subjective: 83 y.o. returns the office today for painful, elongated, thickened toenails which she cannot trim herself. Denies any redness or drainage around the nails. She denies any redness, drainage, swelling to the lesion on the right hallux. Denies any acute changes since last appointment and no new complaints today. Denies any systemic complaints such as fevers, chills, nausea, vomiting.   Objective: AAO 3, NAD DP/PT pulses palpable, CRT less than 3 seconds Nails hypertrophic, dystrophic, elongated, brittle, discolored 10. There is tenderness overlying the nails 1-5 bilaterally. There is no surrounding erythema or drainage along the nail sites. On the plantar medial aspect of the right hallux is a hyperkeratotic lesionwith evidence of dried blood in the callus. Marland Kitchen Upon debridement there is a small superficial abrasion type lesion present but there is no probing, undermining or tunneling. There is no swelling erythema, ascending synovitis. There is no fluctuance, crepitus, malodor. No open lesions or pre-ulcerative lesions are identified. No other areas of tenderness bilateral lower extremities. No overlying edema, erythema, increased warmth. No pain with calf compression, swelling, warmth, erythema.  Assessment: Patient presents with symptomatic onychomycosis; superficial wound right hallux  Plan: -Treatment options including alternatives, risks, complications were discussed -Nails sharply debrided 10 without complication/bleeding. -Hyperkeratotic lesion sharply debrided 1 to reveal underlying ulceration today. Recommended small amount of antibiotic ointment dressing changes daily to this area. Offloading pads were also dispensed. -Discussed daily foot inspection. If there are any changes, to call the office immediately.  -Follow-up in 4 weeks or sooner if any problems are to arise. In the meantime, encouraged to call the office with any questions, concerns, changes symptoms.  Celesta Gentile, DPM

## 2016-12-19 ENCOUNTER — Ambulatory Visit
Admission: RE | Admit: 2016-12-19 | Discharge: 2016-12-19 | Disposition: A | Discharge status: Home / Self Care | Payer: Medicare Other | Source: Ambulatory Visit | Attending: Adult Health | Admitting: Adult Health

## 2016-12-19 DIAGNOSIS — C50912 Malignant neoplasm of unspecified site of left female breast: Secondary | ICD-10-CM

## 2016-12-19 DIAGNOSIS — Z780 Asymptomatic menopausal state: Secondary | ICD-10-CM | POA: Diagnosis not present

## 2016-12-19 DIAGNOSIS — M810 Age-related osteoporosis without current pathological fracture: Secondary | ICD-10-CM | POA: Diagnosis not present

## 2016-12-19 DIAGNOSIS — R928 Other abnormal and inconclusive findings on diagnostic imaging of breast: Secondary | ICD-10-CM | POA: Diagnosis not present

## 2016-12-19 DIAGNOSIS — Z79811 Long term (current) use of aromatase inhibitors: Secondary | ICD-10-CM

## 2016-12-29 DIAGNOSIS — E119 Type 2 diabetes mellitus without complications: Secondary | ICD-10-CM | POA: Diagnosis not present

## 2016-12-29 DIAGNOSIS — E782 Mixed hyperlipidemia: Secondary | ICD-10-CM | POA: Diagnosis not present

## 2016-12-29 DIAGNOSIS — E039 Hypothyroidism, unspecified: Secondary | ICD-10-CM | POA: Diagnosis not present

## 2017-01-09 ENCOUNTER — Encounter: Payer: Self-pay | Admitting: Podiatry

## 2017-01-09 ENCOUNTER — Ambulatory Visit (INDEPENDENT_AMBULATORY_CARE_PROVIDER_SITE_OTHER): Payer: Medicare Other | Admitting: Podiatry

## 2017-01-09 DIAGNOSIS — L84 Corns and callosities: Secondary | ICD-10-CM | POA: Diagnosis not present

## 2017-01-09 DIAGNOSIS — E1149 Type 2 diabetes mellitus with other diabetic neurological complication: Secondary | ICD-10-CM | POA: Diagnosis not present

## 2017-01-09 NOTE — Progress Notes (Signed)
Subjective: 83 year old female presents the office today for concerns of a pre-ulcerative callus follow-up for the right big toe. She states the wound is healed and she denies he swelling or redness or any drainage but she does get a callus over the area. Denies he swelling or redness or any drainage. Denies any systemic complaints such as fevers, chills, nausea, vomiting. No acute changes since last appointment, and no other complaints at this time.   Objective: AAO x3, NAD DP/PT pulses palpable bilaterally, CRT less than 3 seconds Hyperkeratotic lesion present right medial hallux. Upon debridement there is no underlying ulceration, drainage or any clinical signs of infection.  No open lesions or pre-ulcerative lesions.  No pain with calf compression, swelling, warmth, erythema  Assessment: Resolved wound right hallux with pre-ulcerative callus   Plan: -All treatment options discussed with the patient including all alternatives, risks, complications.  -Hyperkeratotic lesion sharply debrided 1 without complications or bleeding. Offloading pads were dispensed. -Daily foot inspection. -RTC 3 weeks. -Patient encouraged to call the office with any questions, concerns, change in symptoms.   Celesta Gentile, DPM

## 2017-01-10 DIAGNOSIS — E782 Mixed hyperlipidemia: Secondary | ICD-10-CM | POA: Diagnosis not present

## 2017-01-10 DIAGNOSIS — E039 Hypothyroidism, unspecified: Secondary | ICD-10-CM | POA: Diagnosis not present

## 2017-01-10 DIAGNOSIS — I10 Essential (primary) hypertension: Secondary | ICD-10-CM | POA: Diagnosis not present

## 2017-01-10 DIAGNOSIS — I2510 Atherosclerotic heart disease of native coronary artery without angina pectoris: Secondary | ICD-10-CM | POA: Diagnosis not present

## 2017-01-10 DIAGNOSIS — M810 Age-related osteoporosis without current pathological fracture: Secondary | ICD-10-CM | POA: Diagnosis not present

## 2017-01-10 DIAGNOSIS — E119 Type 2 diabetes mellitus without complications: Secondary | ICD-10-CM | POA: Diagnosis not present

## 2017-01-10 DIAGNOSIS — Z8249 Family history of ischemic heart disease and other diseases of the circulatory system: Secondary | ICD-10-CM | POA: Diagnosis not present

## 2017-01-10 DIAGNOSIS — K219 Gastro-esophageal reflux disease without esophagitis: Secondary | ICD-10-CM | POA: Diagnosis not present

## 2017-04-04 ENCOUNTER — Other Ambulatory Visit: Payer: Self-pay | Admitting: Emergency Medicine

## 2017-04-11 DIAGNOSIS — M810 Age-related osteoporosis without current pathological fracture: Secondary | ICD-10-CM | POA: Diagnosis not present

## 2017-04-11 DIAGNOSIS — E039 Hypothyroidism, unspecified: Secondary | ICD-10-CM | POA: Diagnosis not present

## 2017-04-11 DIAGNOSIS — E119 Type 2 diabetes mellitus without complications: Secondary | ICD-10-CM | POA: Diagnosis not present

## 2017-04-11 DIAGNOSIS — E782 Mixed hyperlipidemia: Secondary | ICD-10-CM | POA: Diagnosis not present

## 2017-04-14 DIAGNOSIS — E782 Mixed hyperlipidemia: Secondary | ICD-10-CM | POA: Diagnosis not present

## 2017-04-14 DIAGNOSIS — I10 Essential (primary) hypertension: Secondary | ICD-10-CM | POA: Diagnosis not present

## 2017-04-14 DIAGNOSIS — K219 Gastro-esophageal reflux disease without esophagitis: Secondary | ICD-10-CM | POA: Diagnosis not present

## 2017-04-14 DIAGNOSIS — I2510 Atherosclerotic heart disease of native coronary artery without angina pectoris: Secondary | ICD-10-CM | POA: Diagnosis not present

## 2017-04-14 DIAGNOSIS — E119 Type 2 diabetes mellitus without complications: Secondary | ICD-10-CM | POA: Diagnosis not present

## 2017-04-14 DIAGNOSIS — E039 Hypothyroidism, unspecified: Secondary | ICD-10-CM | POA: Diagnosis not present

## 2017-04-14 DIAGNOSIS — Z833 Family history of diabetes mellitus: Secondary | ICD-10-CM | POA: Diagnosis not present

## 2017-04-14 DIAGNOSIS — M810 Age-related osteoporosis without current pathological fracture: Secondary | ICD-10-CM | POA: Diagnosis not present

## 2017-04-19 ENCOUNTER — Other Ambulatory Visit: Payer: Self-pay

## 2017-04-19 DIAGNOSIS — C50512 Malignant neoplasm of lower-outer quadrant of left female breast: Secondary | ICD-10-CM

## 2017-04-20 ENCOUNTER — Ambulatory Visit (HOSPITAL_BASED_OUTPATIENT_CLINIC_OR_DEPARTMENT_OTHER): Payer: Medicare Other | Admitting: Oncology

## 2017-04-20 ENCOUNTER — Other Ambulatory Visit (HOSPITAL_BASED_OUTPATIENT_CLINIC_OR_DEPARTMENT_OTHER): Payer: Medicare Other

## 2017-04-20 VITALS — BP 141/59 | HR 58 | Temp 98.4°F | Resp 18 | Ht 64.0 in | Wt 196.3 lb

## 2017-04-20 DIAGNOSIS — Z853 Personal history of malignant neoplasm of breast: Secondary | ICD-10-CM

## 2017-04-20 DIAGNOSIS — M8580 Other specified disorders of bone density and structure, unspecified site: Secondary | ICD-10-CM

## 2017-04-20 DIAGNOSIS — C50512 Malignant neoplasm of lower-outer quadrant of left female breast: Secondary | ICD-10-CM

## 2017-04-20 DIAGNOSIS — Z170 Estrogen receptor positive status [ER+]: Principal | ICD-10-CM

## 2017-04-20 LAB — CBC WITH DIFFERENTIAL/PLATELET
BASO%: 0.8 % (ref 0.0–2.0)
BASOS ABS: 0 10*3/uL (ref 0.0–0.1)
EOS ABS: 0.1 10*3/uL (ref 0.0–0.5)
EOS%: 1.8 % (ref 0.0–7.0)
HCT: 42.9 % (ref 34.8–46.6)
HEMOGLOBIN: 14.2 g/dL (ref 11.6–15.9)
LYMPH%: 25.2 % (ref 14.0–49.7)
MCH: 29.6 pg (ref 25.1–34.0)
MCHC: 33.1 g/dL (ref 31.5–36.0)
MCV: 89.2 fL (ref 79.5–101.0)
MONO#: 0.5 10*3/uL (ref 0.1–0.9)
MONO%: 8.5 % (ref 0.0–14.0)
NEUT%: 63.7 % (ref 38.4–76.8)
NEUTROS ABS: 3.9 10*3/uL (ref 1.5–6.5)
PLATELETS: 222 10*3/uL (ref 145–400)
RBC: 4.81 10*6/uL (ref 3.70–5.45)
RDW: 14.5 % (ref 11.2–14.5)
WBC: 6.1 10*3/uL (ref 3.9–10.3)
lymph#: 1.5 10*3/uL (ref 0.9–3.3)

## 2017-04-20 LAB — COMPREHENSIVE METABOLIC PANEL
ALBUMIN: 3.6 g/dL (ref 3.5–5.0)
ALK PHOS: 79 U/L (ref 40–150)
ALT: 14 U/L (ref 0–55)
ANION GAP: 8 meq/L (ref 3–11)
AST: 17 U/L (ref 5–34)
BILIRUBIN TOTAL: 1.05 mg/dL (ref 0.20–1.20)
BUN: 18.8 mg/dL (ref 7.0–26.0)
CALCIUM: 10.2 mg/dL (ref 8.4–10.4)
CO2: 26 mEq/L (ref 22–29)
Chloride: 106 mEq/L (ref 98–109)
Creatinine: 0.9 mg/dL (ref 0.6–1.1)
EGFR: 56 mL/min/{1.73_m2} — AB (ref >90)
Glucose: 128 mg/dl (ref 70–140)
Potassium: 4.4 mEq/L (ref 3.5–5.1)
Sodium: 140 mEq/L (ref 136–145)
TOTAL PROTEIN: 6.8 g/dL (ref 6.4–8.3)

## 2017-04-20 NOTE — Progress Notes (Signed)
ID: [name]   DOB: [birth year]  MR#: [id number]  CSN#:[id number]  PCP: Lorne Skeens, MD GYN: SU:  OTHER MD: Suella Broad, Rutherford Guys  CHIEF COMPLAINT:  Left breast cancer   CURRENT THERAPY: Completing 5 years of anastrozole  BREAST CANCER HISTORY:  From the original intake note:  The patient was noted to have left breast calcifications which have been followed on a every 6 month basis, most recently 10/11/2011. Bilateral diagnostic mammography on that date showed a mass in the medial left breast and a separate area of linear calcifications. The right breast was unremarkable. Ultrasound showed a simple cysts correlating with the mass seen on mammography. The area of abnormal calcifications in the lower outer quadrant was biopsied 10/20/2011 and showed ([id number]) and invasive ductal carcinoma, which appeared low to intermediate grade, and which was estrogen receptor positive at 98%, progesterone receptor positive at 54%, with no HER-2 amplification, and with an MIB-1 of 47%  On 10/27/2011 the patient underwent bilateral breast MRIs. This showed post biopsy change but also a 3 mm focus of enhancement in the lateral aspect of the biopsy cavity. There was no MRI evidence for multifocal, or contralateral malignancy or for lymph node spread. Accordingly, on 11/21/2011 the patient underwent left lumpectomy and sentinel lymph node sampling under Dr. Georgette Dover. This showed ([id number]) high-grade invasive ductal carcinoma, measuring 1.1 cm, with 0 of 2 lymph nodes negative, and ample margins. Repeat HER-2 testing again showed no amplification.  Her subsequent history is as detailed below.  INTERVAL HISTORY:  [name] returns today for follow-up and treatment of her estrogen receptor positive breast cancer. She completed 5 years of anastrozole June 2018. She is still on the medication, but may stop it and now if she wishes or she may simply take what she has left of her supply and then stop.  She has tolerated this well.Hot flashes and vaginal dryness are not a major issue. She never developed the arthralgias or myalgias that many patients can experience on this medication. She obtains it at a good price.  After 2017 zolendronate acid was omitted because of impending dental procedures. There has been no evidence of osteonecrosis of the jaw  REVIEW OF SYSTEMS: She exercises by walking in the pool. She is otherwise normally active for her age. She feels she is doing "great". A detailed review of systems today was otherwise noncontributory  PAST MEDICAL HISTORY: Past Medical History:  Diagnosis Date  . Arthritis   . Breast cancer ([place]) 10/20/11   L breast, ER/PR+, HER2 -  . Breast cancer, left breast ([place]) 11/01/2011  . Bronchitis    hx of  . CAD (coronary artery disease)    LHC 06/14/11: LAD 80% and 80% after Dx, Dx 80% (Dx was small), oOM 80%, mOM 70% (OM was small), oRCA 70-80%, mRCA 60-70%, EF 55% - referred for CABG;   Echo 06/14/11: mild focal basal septal hypertrophy, grade 1 diast dysfxn, mild LAE, mild RVE, PASP 37.    . Carotid stenosis    dopplers 29/52: LICA 84-13%  . Diabetes mellitus    x 5-6 yrs  . Diverticulitis of colon with perforation   . DM2 (diabetes mellitus, type 2) ([place])   . GERD (gastroesophageal reflux disease)   . History of kidney stones   . HLD (hyperlipidemia)   . HTN (hypertension)   . Hypothyroidism   . Kidney stones   . Psoriasis   . RLS (restless legs syndrome)   . S/P CABG x 2  06/2011   Dr. Servando Snare (L-LAD, S-RCA)  . S/P radiation therapy 12/20/11 - 01/10/12   Left Breast: 4256 cGy/ 16 Fractions    PAST SURGICAL HISTORY: Past Surgical History:  Procedure Laterality Date  . ABDOMINAL HYSTERECTOMY     age 45's  . APPENDECTOMY    . arthroscopy right knee    . BREAST BIOPSY Left 11/04/2011   Benign  . BREAST BIOPSY Left 10/20/2011   Malignant  . breast biospy  11/04/11   Breast, Left, Needle Core Biopsy 6 Oclock, Benign Breast Tissue  wtih Fibrocystic  Changes: Microcalcifications Identified, No Neoplasm or Malignancy Identified  . breast biospy  10/20/11   Breast, Left, Needle Core Biopsy LOQ: Invsive ductal Carcinoman  . BREAST LUMPECTOMY Left 11/22/11   Breast, Lumpectomy, Left with Sentinel Node Biopsy: Invasive High Grade Ductal Carcinoma : No lymphovascular Invasion: High  Grade Ductal Carcinoma In-Situ with Calcification and Comedo Necrosis  . CARDIAC CATHETERIZATION  2012  . COLON RESECTION  1977  . COLON SURGERY     part colectomy-  . COLONOSCOPY    . CORONARY ARTERY BYPASS GRAFT  10/12  . Off-pump coronary artery bypass grafting x2  06/16/2011  . SHOULDER ARTHROSCOPY WITH SUBACROMIAL DECOMPRESSION, ROTATOR CUFF REPAIR AND BICEP TENDON REPAIR Right 10/16/2014   Procedure: RIGHT SHOULDER ARTHROSCOPY WITH SUBACROMIAL DECOMPRESSION, DISTAL CLAVICLE RESECTION AND ROTATOR CUFF REPAIR ;  Surgeon: Marin Shutter, MD;  Location: [facility];  Service: Orthopedics;  Laterality: Right;  . VEIN BYPASS SURGERY  1999    FAMILY HISTORY Family History  Problem Relation Age of Onset  . Cancer Mother        throat  . Heart disease Mother   . Heart disease Father   . Hypertension Brother   . Cancer Paternal Uncle    the patient's father died at the age of 60 from a subarachnoid hemorrhage. The patient's mother died at the age of 50 from congestive heart failure. She had a history of throat cancer. The patient has one brother, currently 40 years old. She had no sisters. There is no history of breast or ovarian cancer in the family  GYNECOLOGIC HISTORY: She had menarche age 69. She is GX P2, first pregnancy to term age 58. She underwent hysterectomy around age 36 to do to menorrhagia. 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 Her husband of 58+ years, [name] (goes by [name]) is having some problems walking. Their son [name] lives in [place]  and works for [employer]. The patient's other child was stillborn. She has no grandchildren. She belongs to the [employer] and wants me to know that on Saturdays starting in May from 8 AM to 2:30 PM they have a hot dog sale that is considered the best in [place] (actually featured in the [place] this month)  ADVANCED DIRECTIVES: In place  HEALTH MAINTENANCE: Social History  Substance Use Topics  . Smoking status: Former Smoker    Packs/day: 1.00    Years: 6.00    Types: Cigarettes    Quit date: 09/05/1980  . Smokeless tobacco: Never Used  . Alcohol use Yes     Comment: socially     Colonoscopy: "about 2007, no need to repeat"  PAP: s/p hysterectomy  Bone density: At the breast center 09/12/2008, showing significant osteopenia  Lipid panel: Under treatment  No Known Allergies  Current Outpatient Prescriptions  Medication Sig Dispense Refill  . albuterol (PROVENTIL HFA;VENTOLIN HFA) 108 (90 BASE) MCG/ACT inhaler Inhale 1-2 puffs into the lungs every 6 (six) hours as needed for wheezing or shortness of breath. 1 Inhaler 1  . anastrozole (ARIMIDEX) 1 MG tablet Take 1 tablet (1 mg total) by mouth daily. 90 tablet 2  . aspirin 81 MG tablet Take 81 mg by mouth daily.      . Biotin 5 MG CAPS Take 1 capsule by mouth daily.    Marland Kitchen CALCITRIOL PO Take 2 capsules by mouth daily.    . Calcium Carbonate (OS-CAL PO) Take 1 tablet by mouth daily.    . Canagliflozin (INVOKANA) 300 MG TABS Take 1 tablet by mouth every morning.     . Cholecalciferol (VITAMIN D-3) 5000 UNITS TABS Take 1 tablet by mouth daily.    . CRESTOR 20 MG tablet Take 20 mg by mouth daily.     . diazepam (VALIUM) 5 MG tablet Take 0.5-1 tablets (2.5-5 mg total) by mouth every 6 (six) hours as needed for muscle spasms or sedation. 40 tablet 1  . esomeprazole (NEXIUM) 40 MG capsule Take 40 mg by mouth daily before breakfast.      . ezetimibe (ZETIA) 10 MG tablet Take 10 mg by mouth daily.       . furosemide (LASIX) 40 MG tablet TAKE 1 TABLET DAILY.PATIENTNEEDS TO KEEP 07/01/16     APPOINTMENT FOR   FURTHER REFILLS. 90 tablet 3  . gabapentin (NEURONTIN) 100 MG capsule Take 1 capsule (100 mg total) by mouth at bedtime as needed. 90 capsule 3  . Glucosamine-Chondroitin (GLUCOSAMINE CHONDR COMPLEX PO) Take 1 capsule by mouth 2 (two) times daily.    Marland Kitchen ibuprofen (ADVIL,MOTRIN) 200 MG tablet Take 400 mg by mouth every 6 (six) hours as needed for moderate pain (shoulder pain).    Marland Kitchen KLOR-CON M10 10 MEQ tablet TAKE 1 TABLET DAILY 90 tablet 0  . levothyroxine (SYNTHROID, LEVOTHROID) 137 MCG tablet Take 137 mcg by mouth daily before breakfast.    . losartan (COZAAR) 50 MG tablet Take 1 tablet (50 mg total) by mouth 2 (two) times daily. 180 tablet 3  . metoprolol (LOPRESSOR) 50 MG tablet Take 1.5 tablets (75 mg total) by mouth 2 (two) times daily. Please keep 07/01/16 appt for further refills 270 tablet 0  . Multiple Vitamin (MULTIVITAMIN) capsule Take 1 capsule by mouth daily.      . Multiple Vitamins-Minerals (EYE VITAMINS) CAPS Take 1 capsule by mouth daily.     . mupirocin ointment (BACTROBAN) 2 % Apply 1 application topically 2 (two) times daily. 22 g 0  . pioglitazone (ACTOS) 15 MG tablet Take 15 mg by mouth daily.    . potassium chloride (KLOR-CON M10) 10 MEQ tablet Take 1 tablet (10 mEq total) by mouth daily. 90 tablet 3  . rOPINIRole (REQUIP) 3 MG tablet Take 3 mg by mouth at bedtime.      No current facility-administered medications for this visit.     OBJECTIVE: Middle-aged white womanWho appears well  Vitals:   04/20/17 0823  BP: (!) 141/59  Pulse: (!) 58  Resp: 18  Temp: 98.4 F (36.9 C)  SpO2: 96%     Body mass index is 33.69 kg/m.    ECOG FS: 0 Filed Weights   04/20/17 0823  Weight: 196 lb 4.8 oz (89 kg)   Sclerae unicteric, pupils round and equal Oropharynx clear and moist No cervical or supraclavicular adenopathy Lungs  no rales or rhonchi Heart regular rate and  rhythm Abd soft, nontender, positive bowel sounds MSK no focal spinal tenderness, no upper extremity lymphedema Neuro: nonfocal, well oriented, appropriate affect Breasts: The right breast is benign. The left breast is status post lumpectomy and radiation. There is no evidence of local recurrence. Both axillae are benign.   LAB RESULTS: Lab Results  Component Value Date   WBC 6.1 04/20/2017   NEUTROABS 3.9 04/20/2017   HGB 14.2 04/20/2017   HCT 42.9 04/20/2017   MCV 89.2 04/20/2017   PLT 222 04/20/2017      Chemistry      Component Value Date/Time   NA 140 04/14/2016 0848   K 4.2 04/14/2016 0848   CL 103 10/08/2014 1128   CL 105 10/17/2012 0912   CO2 25 04/14/2016 0848   BUN 18.5 04/14/2016 0848   CREATININE 0.9 04/14/2016 0848      Component Value Date/Time   CALCIUM 9.8 04/14/2016 0848   ALKPHOS 78 04/14/2016 0848   AST 18 04/14/2016 0848   ALT 17 04/14/2016 0848   BILITOT 0.59 04/14/2016 0848       Lab Results  Component Value Date   LABCA2 14 10/17/2012     STUDIES: Bilateral diagnostic mammography with tomography at the [facility] 12/19/2016 found the breast density to be category B. There was no evidence of malignancy.  Bone density done the same day found a T score of -2.6, essentially stable to slightly improved as compared to prior (was -2.8).  ASSESSMENT: 83 y.o.  [place] woman,   (1)  status post left lumpectomy and sentinel lymph node sampling 11/21/2011 for a T1c N0, stage IA invasive ductal carcinoma, grade 3, strongly estrogen and progesterone receptor positive, with an MIB-1 of 47%, and no HER-2 amplification.   (2)  Completed radiation 01/10/2012 and started anastrozole June 2013, stopped August 2018  (3)  osteoporosis, receiving zoledronic acid annually, first dose given on 05/24/2012, last dose 05/13/2015  (a) Bone density 12/19/2016 finds the T score at -2.6 (was -2.8 on 04/29/2014  PLAN:  Lysa is now more than 5 years out from  definitive surgery for her breast cancer with no evidence of disease recurrence. This is very favorable.  She has completed 5 years of anti-estrogens. She has a stable bone density.  At this point we are stopping anastrozole and releasing her from follow-up here. As far as breast cancer follow-up is concerned all she will need is her yearly mammography, and a yearly physician breast exam  I will be glad to see the nor area at any point in the future if on when the need arises but as of now we are making no further routine appointments for her here.    Jermond Burkemper C    04/20/2017

## 2017-05-11 DIAGNOSIS — R350 Frequency of micturition: Secondary | ICD-10-CM | POA: Diagnosis not present

## 2017-05-11 DIAGNOSIS — R8271 Bacteriuria: Secondary | ICD-10-CM | POA: Diagnosis not present

## 2017-06-09 DIAGNOSIS — Z23 Encounter for immunization: Secondary | ICD-10-CM | POA: Diagnosis not present

## 2017-06-23 ENCOUNTER — Other Ambulatory Visit: Payer: Self-pay | Admitting: Cardiovascular Disease

## 2017-06-23 NOTE — Telephone Encounter (Signed)
Please call office and schedule appointment for further refills [phone]

## 2017-06-28 DIAGNOSIS — E782 Mixed hyperlipidemia: Secondary | ICD-10-CM | POA: Diagnosis not present

## 2017-06-28 DIAGNOSIS — E039 Hypothyroidism, unspecified: Secondary | ICD-10-CM | POA: Diagnosis not present

## 2017-06-28 DIAGNOSIS — E119 Type 2 diabetes mellitus without complications: Secondary | ICD-10-CM | POA: Diagnosis not present

## 2017-06-30 ENCOUNTER — Telehealth: Payer: Self-pay | Admitting: Cardiovascular Disease

## 2017-06-30 ENCOUNTER — Other Ambulatory Visit: Payer: Self-pay | Admitting: *Deleted

## 2017-06-30 MED ORDER — LOSARTAN POTASSIUM 50 MG PO TABS: 50.0000 mg | ORAL_TABLET | Freq: Two times a day (BID) | ORAL | 0 refills | Status: DC

## 2017-06-30 MED ORDER — FUROSEMIDE 40 MG PO TABS: ORAL_TABLET | ORAL | 0 refills | Status: DC

## 2017-06-30 NOTE — Telephone Encounter (Signed)
[name] is calling to see if the remainder part of her medication be filled . Losartan and Furosemide , they only filled it for 30 days and she normally gets a 90  Day prescription . Its goes [facility] . She is scheduled to see Dr. Angelena Form on 10/11/17. Please call if you have any questions .

## 2017-07-13 DIAGNOSIS — G2581 Restless legs syndrome: Secondary | ICD-10-CM | POA: Diagnosis not present

## 2017-07-13 DIAGNOSIS — M810 Age-related osteoporosis without current pathological fracture: Secondary | ICD-10-CM | POA: Diagnosis not present

## 2017-07-13 DIAGNOSIS — Z833 Family history of diabetes mellitus: Secondary | ICD-10-CM | POA: Diagnosis not present

## 2017-07-13 DIAGNOSIS — I10 Essential (primary) hypertension: Secondary | ICD-10-CM | POA: Diagnosis not present

## 2017-07-13 DIAGNOSIS — E039 Hypothyroidism, unspecified: Secondary | ICD-10-CM | POA: Diagnosis not present

## 2017-07-13 DIAGNOSIS — I2510 Atherosclerotic heart disease of native coronary artery without angina pectoris: Secondary | ICD-10-CM | POA: Diagnosis not present

## 2017-07-13 DIAGNOSIS — K219 Gastro-esophageal reflux disease without esophagitis: Secondary | ICD-10-CM | POA: Diagnosis not present

## 2017-07-13 DIAGNOSIS — E119 Type 2 diabetes mellitus without complications: Secondary | ICD-10-CM | POA: Diagnosis not present

## 2017-07-13 DIAGNOSIS — E782 Mixed hyperlipidemia: Secondary | ICD-10-CM | POA: Diagnosis not present

## 2017-08-07 ENCOUNTER — Encounter (HOSPITAL_COMMUNITY): Payer: Self-pay

## 2017-08-07 ENCOUNTER — Other Ambulatory Visit: Payer: Self-pay

## 2017-08-07 ENCOUNTER — Emergency Department (HOSPITAL_COMMUNITY)
Admission: EM | Admit: 2017-08-07 | Discharge: 2017-08-07 | Disposition: A | Discharge status: Home / Self Care | Payer: Medicare Other | Attending: Emergency Medicine | Admitting: Emergency Medicine

## 2017-08-07 ENCOUNTER — Emergency Department (HOSPITAL_COMMUNITY): Payer: Medicare Other

## 2017-08-07 DIAGNOSIS — Z87891 Personal history of nicotine dependence: Secondary | ICD-10-CM | POA: Insufficient documentation

## 2017-08-07 DIAGNOSIS — Z7984 Long term (current) use of oral hypoglycemic drugs: Secondary | ICD-10-CM | POA: Insufficient documentation

## 2017-08-07 DIAGNOSIS — N201 Calculus of ureter: Secondary | ICD-10-CM | POA: Diagnosis not present

## 2017-08-07 DIAGNOSIS — I2510 Atherosclerotic heart disease of native coronary artery without angina pectoris: Secondary | ICD-10-CM | POA: Insufficient documentation

## 2017-08-07 DIAGNOSIS — Z79899 Other long term (current) drug therapy: Secondary | ICD-10-CM | POA: Diagnosis not present

## 2017-08-07 DIAGNOSIS — N132 Hydronephrosis with renal and ureteral calculous obstruction: Secondary | ICD-10-CM | POA: Diagnosis not present

## 2017-08-07 DIAGNOSIS — I119 Hypertensive heart disease without heart failure: Secondary | ICD-10-CM | POA: Insufficient documentation

## 2017-08-07 DIAGNOSIS — N23 Unspecified renal colic: Secondary | ICD-10-CM | POA: Diagnosis not present

## 2017-08-07 DIAGNOSIS — R1031 Right lower quadrant pain: Secondary | ICD-10-CM | POA: Diagnosis present

## 2017-08-07 DIAGNOSIS — N390 Urinary tract infection, site not specified: Secondary | ICD-10-CM

## 2017-08-07 DIAGNOSIS — E119 Type 2 diabetes mellitus without complications: Secondary | ICD-10-CM | POA: Insufficient documentation

## 2017-08-07 DIAGNOSIS — E039 Hypothyroidism, unspecified: Secondary | ICD-10-CM | POA: Diagnosis not present

## 2017-08-07 LAB — COMPREHENSIVE METABOLIC PANEL
ALT: 18 U/L (ref 14–54)
AST: 26 U/L (ref 15–41)
Albumin: 4.3 g/dL (ref 3.5–5.0)
Alkaline Phosphatase: 96 U/L (ref 38–126)
Anion gap: 11 (ref 5–15)
BILIRUBIN TOTAL: 0.9 mg/dL (ref 0.3–1.2)
BUN: 23 mg/dL — AB (ref 6–20)
CO2: 25 mmol/L (ref 22–32)
CREATININE: 1.01 mg/dL — AB (ref 0.44–1.00)
Calcium: 9.8 mg/dL (ref 8.9–10.3)
Chloride: 103 mmol/L (ref 101–111)
GFR, EST AFRICAN AMERICAN: 58 mL/min — AB (ref >60)
GFR, EST NON AFRICAN AMERICAN: 50 mL/min — AB (ref >60)
Glucose, Bld: 183 mg/dL — ABNORMAL HIGH (ref 65–99)
Potassium: 4 mmol/L (ref 3.5–5.1)
Sodium: 139 mmol/L (ref 135–145)
TOTAL PROTEIN: 7.5 g/dL (ref 6.5–8.1)

## 2017-08-07 LAB — URINALYSIS, ROUTINE W REFLEX MICROSCOPIC
BILIRUBIN URINE: NEGATIVE
KETONES UR: NEGATIVE mg/dL
NITRITE: NEGATIVE
PH: 5 (ref 5.0–8.0)
Protein, ur: NEGATIVE mg/dL
SPECIFIC GRAVITY, URINE: 1.011 (ref 1.005–1.030)

## 2017-08-07 LAB — CBC
HCT: 44 % (ref 36.0–46.0)
Hemoglobin: 14.4 g/dL (ref 12.0–15.0)
MCH: 29.4 pg (ref 26.0–34.0)
MCHC: 32.7 g/dL (ref 30.0–36.0)
MCV: 89.8 fL (ref 78.0–100.0)
PLATELETS: 245 10*3/uL (ref 150–400)
RBC: 4.9 MIL/uL (ref 3.87–5.11)
RDW: 15.1 % (ref 11.5–15.5)
WBC: 7 10*3/uL (ref 4.0–10.5)

## 2017-08-07 LAB — LIPASE, BLOOD: Lipase: 29 U/L (ref 11–51)

## 2017-08-07 MED ORDER — MORPHINE SULFATE (PF) 4 MG/ML IV SOLN
4.0000 mg | Freq: Once | INTRAVENOUS | Status: AC
Start: 2017-08-07 — End: 2017-08-07
  Administered 2017-08-07: 4 mg via INTRAVENOUS
  Filled 2017-08-07: qty 1

## 2017-08-07 MED ORDER — CEFTRIAXONE SODIUM 1 G IJ SOLR
1.0000 g | INTRAMUSCULAR | Status: DC
  Administered 2017-08-07: 1 g via INTRAVENOUS
  Filled 2017-08-07: qty 10

## 2017-08-07 MED ORDER — TAMSULOSIN HCL 0.4 MG PO CAPS: 0.4000 mg | ORAL_CAPSULE | Freq: Every day | ORAL | 0 refills | Status: DC

## 2017-08-07 MED ORDER — CEPHALEXIN 500 MG PO CAPS: 500.0000 mg | ORAL_CAPSULE | Freq: Three times a day (TID) | ORAL | 0 refills | Status: DC

## 2017-08-07 MED ORDER — DIPHENHYDRAMINE HCL 50 MG/ML IJ SOLN
12.5000 mg | Freq: Once | INTRAMUSCULAR | Status: AC
  Administered 2017-08-07: 12.5 mg via INTRAVENOUS
  Filled 2017-08-07: qty 1

## 2017-08-07 MED ORDER — MORPHINE SULFATE (PF) 4 MG/ML IV SOLN
4.0000 mg | Freq: Once | INTRAVENOUS | Status: AC
  Administered 2017-08-07: 4 mg via INTRAVENOUS
  Filled 2017-08-07: qty 1

## 2017-08-07 MED ORDER — IOPAMIDOL (ISOVUE-300) INJECTION 61%
INTRAVENOUS | Status: AC
  Filled 2017-08-07: qty 100

## 2017-08-07 MED ORDER — IOPAMIDOL (ISOVUE-300) INJECTION 61%
100.0000 mL | Freq: Once | INTRAVENOUS | Status: AC | PRN
  Administered 2017-08-07: 100 mL via INTRAVENOUS

## 2017-08-07 MED ORDER — LORAZEPAM 2 MG/ML IJ SOLN
0.5000 mg | Freq: Once | INTRAMUSCULAR | Status: AC
  Administered 2017-08-07: 0.5 mg via INTRAVENOUS
  Filled 2017-08-07: qty 1

## 2017-08-07 MED ORDER — ONDANSETRON 8 MG PO TBDP: 8.0000 mg | ORAL_TABLET | Freq: Three times a day (TID) | ORAL | 0 refills | Status: DC | PRN

## 2017-08-07 MED ORDER — METOCLOPRAMIDE HCL 5 MG/ML IJ SOLN
2.5000 mg | Freq: Once | INTRAMUSCULAR | Status: AC
  Administered 2017-08-07: 2.5 mg via INTRAVENOUS
  Filled 2017-08-07: qty 2

## 2017-08-07 MED ORDER — ONDANSETRON 4 MG PO TBDP
4.0000 mg | ORAL_TABLET | Freq: Once | ORAL | Status: AC | PRN
  Administered 2017-08-07: 4 mg via ORAL
  Filled 2017-08-07: qty 1

## 2017-08-07 MED ORDER — OXYCODONE-ACETAMINOPHEN 5-325 MG PO TABS: 1.0000 | ORAL_TABLET | ORAL | 0 refills | Status: DC | PRN

## 2017-08-07 NOTE — Discharge Instructions (Signed)
Call your urologist today to schedule an appointment for this week.  You have a 5 mm kidney stone

## 2017-08-07 NOTE — ED Notes (Signed)
ED Provider at bedside. 

## 2017-08-07 NOTE — ED Provider Notes (Signed)
[facility] DEPT Provider Note   CSN: [id number] Arrival date & time: 08/07/17  0930     History   Chief Complaint Chief Complaint  Patient presents with  . Abdominal Pain  . Nausea    HPI [name] is a 83 y.o. female.  83 year old female presents with sudden onset of right lower quadrant abdominal pain which began today after she ate.  Moved her bowels this morning and denies any dark or bloody stools.  Has had some nausea no fever or emesis.  Does have a history of diverticulitis and this is similar.  Attempted Tylenol without relief.  Denies any dysuria or hematuria.      Past Medical History:  Diagnosis Date  . Arthritis   . Breast cancer ([place]) 10/20/11   L breast, ER/PR+, HER2 -  . Breast cancer, left breast ([place]) 11/01/2011  . Bronchitis    hx of  . CAD (coronary artery disease)    LHC 06/14/11: LAD 80% and 80% after Dx, Dx 80% (Dx was small), oOM 80%, mOM 70% (OM was small), oRCA 70-80%, mRCA 60-70%, EF 55% - referred for CABG;   Echo 06/14/11: mild focal basal septal hypertrophy, grade 1 diast dysfxn, mild LAE, mild RVE, PASP 37.    . Carotid stenosis    dopplers 18/29: LICA 93-71%  . Diabetes mellitus    x 5-6 yrs  . Diverticulitis of colon with perforation   . DM2 (diabetes mellitus, type 2) ([place])   . GERD (gastroesophageal reflux disease)   . History of kidney stones   . HLD (hyperlipidemia)   . HTN (hypertension)   . Hypothyroidism   . Kidney stones   . Psoriasis   . RLS (restless legs syndrome)   . S/P CABG x 2 06/2011   Dr. Servando Snare (L-LAD, [facility])  . S/P radiation therapy 12/20/11 - 01/10/12   Left Breast: 4256 cGy/ 16 Fractions    Patient Active Problem List   Diagnosis Date Noted  . Bronchitis 05/13/2015  . Vaginal dryness 04/21/2014  . Osteoporosis 03/26/2012  . Malignant neoplasm of lower-outer quadrant of left breast of female, estrogen receptor positive ([place]) 11/28/2011  . Diabetes mellitus   . Breast  cancer, left breast ([place]) 11/01/2011  . Breast cancer ([place]) 10/20/2011  . Edema 07/05/2011  . CAD (coronary artery disease)   . HTN (hypertension)   . HLD (hyperlipidemia)   . Carotid stenosis   . Kidney stones   . Pregnancy toxemia   . Diverticulitis of colon with perforation   . HYPERTENSION, BENIGN 06/04/2009  . LBBB 06/04/2009    Past Surgical History:  Procedure Laterality Date  . ABDOMINAL HYSTERECTOMY     age 30's  . APPENDECTOMY    . arthroscopy right knee    . BREAST BIOPSY Left 11/04/2011   Benign  . BREAST BIOPSY Left 10/20/2011   Malignant  . breast biospy  11/04/11   Breast, Left, Needle Core Biopsy 6 Oclock, Benign Breast Tissue wtih Fibrocystic  Changes: Microcalcifications Identified, No Neoplasm or Malignancy Identified  . breast biospy  10/20/11   Breast, Left, Needle Core Biopsy LOQ: Invsive ductal Carcinoman  . BREAST LUMPECTOMY Left 11/22/11   Breast, Lumpectomy, Left with Sentinel Node Biopsy: Invasive High Grade Ductal Carcinoma : No lymphovascular Invasion: High  Grade Ductal Carcinoma In-Situ with Calcification and Comedo Necrosis  . CARDIAC CATHETERIZATION  2012  . COLON RESECTION  1977  . COLON SURGERY     part colectomy-  .  COLONOSCOPY    . CORONARY ARTERY BYPASS GRAFT  10/12  . Off-pump coronary artery bypass grafting x2  06/16/2011  . SHOULDER ARTHROSCOPY WITH SUBACROMIAL DECOMPRESSION, ROTATOR CUFF REPAIR AND BICEP TENDON REPAIR Right 10/16/2014   Procedure: RIGHT SHOULDER ARTHROSCOPY WITH SUBACROMIAL DECOMPRESSION, DISTAL CLAVICLE RESECTION AND ROTATOR CUFF REPAIR ;  Surgeon: Marin Shutter, MD;  Location: [facility];  Service: Orthopedics;  Laterality: Right;  . VEIN BYPASS SURGERY  1999    OB History    No data available       Home Medications    Prior to Admission medications   Medication Sig Start Date End Date Taking? Authorizing Provider  albuterol (PROVENTIL HFA;VENTOLIN HFA) 108 (90 BASE) MCG/ACT inhaler Inhale 1-2 puffs into the lungs  every 6 (six) hours as needed for wheezing or shortness of breath. 05/13/15   Susanne Borders, NP  anastrozole (ARIMIDEX) 1 MG tablet Take 1 tablet (1 mg total) by mouth daily. 12/13/16   Magrinat, Virgie Dad, MD  aspirin 81 MG tablet Take 81 mg by mouth daily.      [provider]  Biotin 5 MG CAPS Take 1 capsule by mouth daily.    [provider]  CALCITRIOL PO Take 2 capsules by mouth daily.    [provider]  Calcium Carbonate (OS-CAL PO) Take 1 tablet by mouth daily. 12/08/09   [provider]  Canagliflozin (INVOKANA) 300 MG TABS Take 1 tablet by mouth every morning.     [provider]  Cholecalciferol (VITAMIN D-3) 5000 UNITS TABS Take 1 tablet by mouth daily.    [provider]  CRESTOR 20 MG tablet Take 20 mg by mouth daily.  11/10/11   [provider]  diazepam (VALIUM) 5 MG tablet Take 0.5-1 tablets (2.5-5 mg total) by mouth every 6 (six) hours as needed for muscle spasms or sedation. 10/16/14   Shuford, Olivia Mackie, PA-C  esomeprazole (NEXIUM) 40 MG capsule Take 40 mg by mouth daily before breakfast.      [provider]  ezetimibe (ZETIA) 10 MG tablet Take 10 mg by mouth daily.      [provider]  furosemide (LASIX) 40 MG tablet TAKE 1 TABLET BY MOUTH DAILY 06/30/17   Burnell Blanks, MD  gabapentin (NEURONTIN) 100 MG capsule Take 1 capsule (100 mg total) by mouth at bedtime as needed. 08/02/16   Star Age, MD  Glucosamine-Chondroitin (GLUCOSAMINE CHONDR COMPLEX PO) Take 1 capsule by mouth 2 (two) times daily.    [provider]  ibuprofen (ADVIL,MOTRIN) 200 MG tablet Take 400 mg by mouth every 6 (six) hours as needed for moderate pain (shoulder pain).    [provider]  KLOR-CON M10 10 MEQ tablet TAKE 1 TABLET DAILY 06/06/16   Burnell Blanks, MD  levothyroxine (SYNTHROID, LEVOTHROID) 137 MCG tablet Take 137 mcg by mouth daily before breakfast.    [provider]    losartan (COZAAR) 50 MG tablet Take 1 tablet (50 mg total) by mouth 2 (two) times daily. 06/30/17   Burnell Blanks, MD  metoprolol (LOPRESSOR) 50 MG tablet Take 1.5 tablets (75 mg total) by mouth 2 (two) times daily. Please keep 07/01/16 appt for further refills 05/10/16   Burnell Blanks, MD  Multiple Vitamin (MULTIVITAMIN) capsule Take 1 capsule by mouth daily.      [provider]  Multiple Vitamins-Minerals (EYE VITAMINS) CAPS Take 1 capsule by mouth daily.     [provider]  mupirocin ointment (  BACTROBAN) 2 % Apply 1 application topically 2 (two) times daily. 09/26/16   Trula Slade, DPM  pioglitazone (ACTOS) 15 MG tablet Take 15 mg by mouth daily.    [provider]  potassium chloride (KLOR-CON M10) 10 MEQ tablet Take 1 tablet (10 mEq total) by mouth daily. 08/24/16   Burnell Blanks, MD  rOPINIRole (REQUIP) 3 MG tablet Take 3 mg by mouth at bedtime.     [provider]    Family History Family History  Problem Relation Age of Onset  . Cancer Mother        throat  . Heart disease Mother   . Heart disease Father   . Hypertension Brother   . Cancer Paternal Uncle     Social History Social History   Tobacco Use  . Smoking status: Former Smoker    Packs/day: 1.00    Years: 6.00    Pack years: 6.00    Types: Cigarettes    Last attempt to quit: 09/05/1980    Years since quitting: 36.9  . Smokeless tobacco: Never Used  Substance Use Topics  . Alcohol use: Yes    Comment: socially  . Drug use: No     Allergies   Patient has no known allergies.   Review of Systems Review of Systems  All other systems reviewed and are negative.    Physical Exam Updated Vital Signs BP (!) 189/77 (BP Location: Right Arm) Comment: cannot use left arm  Pulse 71   Temp 98 F (36.7 C) (Oral)   Resp 16   Ht 1.626 m (_0 )   Wt 86.2 kg (190 lb)   SpO2 96%   BMI 32.61 kg/m   Physical Exam  Constitutional: She is  oriented to person, place, and time. She appears well-developed and well-nourished.  Non-toxic appearance. No distress.  HENT:  Head: Normocephalic and atraumatic.  Eyes: Conjunctivae, EOM and lids are normal. Pupils are equal, round, and reactive to light.  Neck: Normal range of motion. Neck supple. No tracheal deviation present. No thyroid mass present.  Cardiovascular: Normal rate, regular rhythm and normal heart sounds. Exam reveals no gallop.  No murmur heard. Pulmonary/Chest: Effort normal and breath sounds normal. No stridor. No respiratory distress. She has no decreased breath sounds. She has no wheezes. She has no rhonchi. She has no rales.  Abdominal: Soft. Normal appearance and bowel sounds are normal. She exhibits no distension. There is tenderness in the right lower quadrant. There is no rigidity, no rebound, no guarding and no CVA tenderness.    Musculoskeletal: Normal range of motion. She exhibits no edema or tenderness.  Neurological: She is alert and oriented to person, place, and time. She has normal strength. No cranial nerve deficit or sensory deficit. GCS eye subscore is 4. GCS verbal subscore is 5. GCS motor subscore is 6.  Skin: Skin is warm and dry. No abrasion and no rash noted.  Psychiatric: She has a normal mood and affect. Her speech is normal and behavior is normal.  Nursing note and vitals reviewed.    ED Treatments / Results  Labs (all labs ordered are listed, but only abnormal results are displayed) Labs Reviewed  COMPREHENSIVE METABOLIC PANEL - Abnormal; Notable for the following components:      Result Value   Glucose, Bld 183 (*)    BUN 23 (*)    Creatinine, Ser 1.01 (*)    GFR calc non Af Amer 50 (*)    GFR calc  Af Amer 58 (*)    All other components within normal limits  LIPASE, BLOOD  CBC  URINALYSIS, ROUTINE W REFLEX MICROSCOPIC    EKG  EKG Interpretation None       Radiology No results found.  Procedures Procedures (including  critical care time)  Medications Ordered in ED Medications  morphine 4 MG/ML injection 4 mg (not administered)  ondansetron (ZOFRAN-ODT) disintegrating tablet 4 mg (4 mg Oral Given 08/07/17 0957)     Initial Impression / Assessment and Plan / ED Course  I have reviewed the triage vital signs and the nursing notes.  Pertinent labs & imaging results that were available during my care of the patient were reviewed by me and considered in my medical decision making (see chart for details).     Patient's pain treated here as well as treated with Rocephin for possible UTI.  CT scan noted and patient will follow up with her urologist this week.  Return precautions given  Final Clinical Impressions(s) / ED Diagnoses   Final diagnoses:  None    ED Discharge Orders    None       Lacretia Leigh, MD 08/07/17 1504

## 2017-08-07 NOTE — ED Triage Notes (Signed)
Patient c/o right lower abdominal pain and nausea after breakfast today. Patient reports a history of diverticulitis. Patient states she took Tylenol with no relief.

## 2017-08-08 ENCOUNTER — Other Ambulatory Visit: Payer: Self-pay | Admitting: Cardiovascular Disease

## 2017-08-09 DIAGNOSIS — N201 Calculus of ureter: Secondary | ICD-10-CM | POA: Diagnosis not present

## 2017-08-09 DIAGNOSIS — N132 Hydronephrosis with renal and ureteral calculous obstruction: Secondary | ICD-10-CM | POA: Diagnosis not present

## 2017-08-10 ENCOUNTER — Other Ambulatory Visit: Payer: Self-pay | Admitting: Cardiovascular Disease

## 2017-08-10 DIAGNOSIS — I6523 Occlusion and stenosis of bilateral carotid arteries: Secondary | ICD-10-CM

## 2017-08-17 DIAGNOSIS — H524 Presbyopia: Secondary | ICD-10-CM | POA: Diagnosis not present

## 2017-08-17 DIAGNOSIS — H353131 Nonexudative age-related macular degeneration, bilateral, early dry stage: Secondary | ICD-10-CM | POA: Diagnosis not present

## 2017-08-17 DIAGNOSIS — Z7984 Long term (current) use of oral hypoglycemic drugs: Secondary | ICD-10-CM | POA: Diagnosis not present

## 2017-08-17 DIAGNOSIS — E119 Type 2 diabetes mellitus without complications: Secondary | ICD-10-CM | POA: Diagnosis not present

## 2017-08-17 DIAGNOSIS — Z961 Presence of intraocular lens: Secondary | ICD-10-CM | POA: Diagnosis not present

## 2017-08-18 ENCOUNTER — Other Ambulatory Visit: Payer: Self-pay | Admitting: Neurology

## 2017-08-21 ENCOUNTER — Ambulatory Visit (HOSPITAL_COMMUNITY)
Admission: RE | Admit: 2017-08-21 | Discharge: 2017-08-21 | Disposition: A | Discharge status: Home / Self Care | Payer: Medicare Other | Source: Ambulatory Visit | Attending: Internal Medicine | Admitting: Internal Medicine

## 2017-08-21 DIAGNOSIS — I6523 Occlusion and stenosis of bilateral carotid arteries: Secondary | ICD-10-CM | POA: Diagnosis not present

## 2017-08-24 DIAGNOSIS — N201 Calculus of ureter: Secondary | ICD-10-CM | POA: Diagnosis not present

## 2017-08-30 ENCOUNTER — Telehealth: Payer: Self-pay | Admitting: Cardiovascular Disease

## 2017-08-30 NOTE — Telephone Encounter (Signed)
New Message     Patient calling to get her Carotid results please call

## 2017-08-30 NOTE — Telephone Encounter (Signed)
Preliminary results reviewed with patient.

## 2017-09-01 DIAGNOSIS — Z961 Presence of intraocular lens: Secondary | ICD-10-CM | POA: Diagnosis not present

## 2017-09-01 DIAGNOSIS — H35363 Drusen (degenerative) of macula, bilateral: Secondary | ICD-10-CM | POA: Diagnosis not present

## 2017-09-01 DIAGNOSIS — E119 Type 2 diabetes mellitus without complications: Secondary | ICD-10-CM | POA: Diagnosis not present

## 2017-09-01 DIAGNOSIS — H353132 Nonexudative age-related macular degeneration, bilateral, intermediate dry stage: Secondary | ICD-10-CM | POA: Diagnosis not present

## 2017-09-03 ENCOUNTER — Other Ambulatory Visit: Payer: Self-pay | Admitting: Cardiovascular Disease

## 2017-10-11 ENCOUNTER — Encounter: Payer: Self-pay | Admitting: Cardiovascular Disease

## 2017-10-11 ENCOUNTER — Other Ambulatory Visit: Payer: Self-pay | Admitting: Cardiovascular Disease

## 2017-10-11 ENCOUNTER — Ambulatory Visit (INDEPENDENT_AMBULATORY_CARE_PROVIDER_SITE_OTHER): Payer: Medicare Other | Admitting: Cardiovascular Disease

## 2017-10-11 ENCOUNTER — Encounter (INDEPENDENT_AMBULATORY_CARE_PROVIDER_SITE_OTHER): Payer: Self-pay

## 2017-10-11 VITALS — BP 145/73 | HR 60 | Ht 64.0 in | Wt 200.0 lb

## 2017-10-11 DIAGNOSIS — I447 Left bundle-branch block, unspecified: Secondary | ICD-10-CM

## 2017-10-11 DIAGNOSIS — I25810 Atherosclerosis of coronary artery bypass graft(s) without angina pectoris: Secondary | ICD-10-CM

## 2017-10-11 DIAGNOSIS — I6523 Occlusion and stenosis of bilateral carotid arteries: Secondary | ICD-10-CM | POA: Diagnosis not present

## 2017-10-11 DIAGNOSIS — I10 Essential (primary) hypertension: Secondary | ICD-10-CM

## 2017-10-11 DIAGNOSIS — E7800 Pure hypercholesterolemia, unspecified: Secondary | ICD-10-CM | POA: Diagnosis not present

## 2017-10-11 DIAGNOSIS — I5032 Chronic diastolic (congestive) heart failure: Secondary | ICD-10-CM

## 2017-10-11 MED ORDER — LOSARTAN POTASSIUM 50 MG PO TABS: 50.0000 mg | ORAL_TABLET | Freq: Two times a day (BID) | ORAL | 3 refills | Status: AC

## 2017-10-11 NOTE — Progress Notes (Signed)
Chief Complaint  Patient presents with  . Coronary Artery Disease    History of Present Illness: 84 yo female with history of CAD s/p CABG 2012, HTN, HLD, DM, carotid artery disease, hypothyroidism, breast cancer here today for cardiac follow up. She presented with unstable angina in October 2012 and was found to have severe triple vessel CAD by cath, followed by 2V CABG per Dr. Servando Snare (LIMA to LAD, SVG to RCA). She has done well since then. Breast cancer s/p lumpectomy and has completed radiation. I met her in November 2014 and at our first visit, she c/o dyspnea. BP was elevated and Cozaar was added. Echo 07/29/13 with normal LVEF, normal PA pressures, no valve disease. Carotid dopplers December 2018 with mild bilateral carotid artery disease. Stress myoview arranged 08/11/14 in workup of jaw and neck pain and showed no ischemia.   She is here today for follow up. The patient denies any chest pain, dyspnea, palpitations, lower extremity edema, orthopnea, PND, dizziness, near syncope or syncope.   Primary Care Physician: Lorne Skeens, MD   Past Medical History:  Diagnosis Date  . Arthritis   . Breast cancer ([place]) 10/20/11   L breast, ER/PR+, HER2 -  . Breast cancer, left breast ([place]) 11/01/2011  . Bronchitis    hx of  . CAD (coronary artery disease)    LHC 06/14/11: LAD 80% and 80% after Dx, Dx 80% (Dx was small), oOM 80%, mOM 70% (OM was small), oRCA 70-80%, mRCA 60-70%, EF 55% - referred for CABG;   Echo 06/14/11: mild focal basal septal hypertrophy, grade 1 diast dysfxn, mild LAE, mild RVE, PASP 37.    . Carotid stenosis    dopplers 93/81: LICA 01-75%  . Diabetes mellitus    x 5-6 yrs  . Diverticulitis of colon with perforation   . DM2 (diabetes mellitus, type 2) ([place])   . GERD (gastroesophageal reflux disease)   . History of kidney stones   . HLD (hyperlipidemia)   . HTN (hypertension)   . Hypothyroidism   . Kidney stones   . Psoriasis   . RLS (restless legs syndrome)    . S/P CABG x 2 06/2011   Dr. Servando Snare (L-LAD, [facility])  . S/P radiation therapy 12/20/11 - 01/10/12   Left Breast: 4256 cGy/ 16 Fractions    Past Surgical History:  Procedure Laterality Date  . ABDOMINAL HYSTERECTOMY     age 68's  . APPENDECTOMY    . arthroscopy right knee    . BREAST BIOPSY Left 11/04/2011   Benign  . BREAST BIOPSY Left 10/20/2011   Malignant  . breast biospy  11/04/11   Breast, Left, Needle Core Biopsy 6 Oclock, Benign Breast Tissue wtih Fibrocystic  Changes: Microcalcifications Identified, No Neoplasm or Malignancy Identified  . breast biospy  10/20/11   Breast, Left, Needle Core Biopsy LOQ: Invsive ductal Carcinoman  . BREAST LUMPECTOMY Left 11/22/11   Breast, Lumpectomy, Left with Sentinel Node Biopsy: Invasive High Grade Ductal Carcinoma : No lymphovascular Invasion: High  Grade Ductal Carcinoma In-Situ with Calcification and [place]edo Necrosis  . CARDIAC CATHETERIZATION  2012  . COLON RESECTION  1977  . COLON SURGERY     part colectomy-  . COLONOSCOPY    . CORONARY ARTERY BYPASS GRAFT  10/12  . Off-pump coronary artery bypass grafting x2  06/16/2011  . SHOULDER ARTHROSCOPY WITH SUBACROMIAL DECOMPRESSION, ROTATOR CUFF REPAIR AND BICEP TENDON REPAIR Right 10/16/2014   Procedure: RIGHT SHOULDER ARTHROSCOPY WITH SUBACROMIAL DECOMPRESSION, DISTAL CLAVICLE RESECTION  AND ROTATOR CUFF REPAIR ;  Surgeon: Marin Shutter, MD;  Location: [facility];  Service: Orthopedics;  Laterality: Right;  . VEIN BYPASS SURGERY  1999    Current Outpatient Medications  Medication Sig Dispense Refill  . acetaminophen (TYLENOL) 500 MG tablet Take 500 mg by mouth every 6 (six) hours as needed for moderate pain.    Marland Kitchen albuterol (PROVENTIL HFA;VENTOLIN HFA) 108 (90 BASE) MCG/ACT inhaler Inhale 1-2 puffs into the lungs every 6 (six) hours as needed for wheezing or shortness of breath. 1 Inhaler 1  . anastrozole (ARIMIDEX) 1 MG tablet Take 1 tablet (1 mg total) by mouth daily. (Patient not taking:  Reported on 08/07/2017) 90 tablet 2  . aspirin 81 MG tablet Take 81 mg by mouth every evening.     . Biotin 5 MG CAPS Take 1 capsule by mouth daily.    Marland Kitchen CALCITRIOL PO Take 2 capsules by mouth daily.    . Calcium Carbonate (OS-CAL PO) Take 1 tablet by mouth daily.    . Canagliflozin (INVOKANA) 300 MG TABS Take 1 tablet by mouth every morning.     . cephALEXin (KEFLEX) 500 MG capsule Take 1 capsule (500 mg total) by mouth 3 (three) times daily. 30 capsule 0  . Cholecalciferol (VITAMIN D-3) 5000 UNITS TABS Take 1 tablet by mouth daily.    . CRESTOR 20 MG tablet Take 20 mg by mouth every evening.     . diazepam (VALIUM) 5 MG tablet Take 0.5-1 tablets (2.5-5 mg total) by mouth every 6 (six) hours as needed for muscle spasms or sedation. (Patient taking differently: Take 5 mg by mouth every 6 (six) hours as needed for muscle spasms or sedation. ) 40 tablet 1  . esomeprazole (NEXIUM) 40 MG capsule Take 40 mg by mouth daily before breakfast.      . ezetimibe (ZETIA) 10 MG tablet Take 10 mg by mouth every evening.     . furosemide (LASIX) 40 MG tablet TAKE 1 TABLET BY MOUTH DAILY 90 tablet 0  . gabapentin (NEURONTIN) 100 MG capsule TAKE 1 CAPSULE AT BEDTIME  AS NEEDED 90 capsule 3  . Glucosamine-Chondroitin (GLUCOSAMINE CHONDR COMPLEX PO) Take 1 capsule by mouth 2 (two) times daily.    Marland Kitchen ibuprofen (ADVIL,MOTRIN) 200 MG tablet Take 400 mg by mouth every 6 (six) hours as needed for moderate pain (shoulder pain).    Marland Kitchen levothyroxine (SYNTHROID, LEVOTHROID) 137 MCG tablet Take 137 mcg by mouth daily before breakfast.    . losartan (COZAAR) 50 MG tablet Take 1 tablet (50 mg total) by mouth 2 (two) times daily. 180 tablet 3  . metoprolol (LOPRESSOR) 50 MG tablet Take 1.5 tablets (75 mg total) by mouth 2 (two) times daily. Please keep 07/01/16 appt for further refills 270 tablet 0  . metoprolol tartrate (LOPRESSOR) 50 MG tablet FOR DIRECTIONS ON HOW TO   TAKE THIS MEDICINE, READ   THE ENCLOSED MEDICATION     INFORMATION FORM 270 tablet 0  . Multiple Vitamin (MULTIVITAMIN) capsule Take 1 capsule by mouth every evening.     . Multiple Vitamins-Minerals (EYE VITAMINS) CAPS Take 1 capsule by mouth 2 (two) times daily.     . mupirocin ointment (BACTROBAN) 2 % Apply 1 application topically 2 (two) times daily. (Patient not taking: Reported on 08/07/2017) 22 g 0  . ondansetron (ZOFRAN ODT) 8 MG disintegrating tablet Take 1 tablet (8 mg total) by mouth every 8 (eight) hours as needed for nausea or vomiting. 20 tablet 0  .  oxyCODONE-acetaminophen (PERCOCET/ROXICET) 5-325 MG tablet Take 1-2 tablets by mouth every 4 (four) hours as needed for severe pain. 15 tablet 0  . pioglitazone (ACTOS) 15 MG tablet Take 15 mg by mouth daily.    . potassium chloride (KLOR-CON M10) 10 MEQ tablet Take 1 tablet (10 mEq total) by mouth daily. Please keep upcoming appt in February before anymore refills. Thank you 90 tablet 0  . rOPINIRole (REQUIP) 3 MG tablet Take 3 mg by mouth at bedtime.     . tamsulosin (FLOMAX) 0.4 MG CAPS capsule Take 1 capsule (0.4 mg total) by mouth daily. 30 capsule 0   No current facility-administered medications for this visit.     No Known Allergies  Social History   Socioeconomic History  . Marital status: Married    Spouse name: Not on file  . Number of children: 1  . Years of education: HS  . Highest education level: Not on file  Social Needs  . Financial resource strain: Not on file  . Food insecurity - worry: Not on file  . Food insecurity - inability: Not on file  . Transportation needs - medical: Not on file  . Transportation needs - non-medical: Not on file  Occupational History  . 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 Smoking status: Former Smoker    Packs/day: 1.00    Years: 6.00    Pack years: 6.00    Types: Cigarettes    Last attempt to quit: 09/05/1980    Years since quitting: 37.1  . Smokeless tobacco: Never Used    Substance and Sexual Activity  . Alcohol use: Yes    Comment: socially  . Drug use: No  . Sexual activity: Not on file    Comment: menarche 22, p2, 1 stillbirth, 1st preg age 39, HRT x many yrs  Other Topics Concern  . Not on file  Social History Narrative   Denies caffeine use     Family History  Problem Relation Age of Onset  . Cancer Mother        throat  . Heart disease Mother   . Heart disease Father   . Hypertension Brother   . Cancer Paternal Uncle     Review of Systems:  As stated in the HPI and otherwise negative.   BP (!) 145/73   Pulse 60   Ht _0  (1.626 m)   Wt 200 lb (90.7 kg)   SpO2 96%   BMI 34.33 kg/m   Physical Examination:  General: Well developed, well nourished, NAD  HEENT: OP clear, mucus membranes moist  SKIN: warm, dry. No rashes. Neuro: No focal deficits  Musculoskeletal: Muscle strength 5/5 all ext  Psychiatric: Mood and affect normal  Neck: No JVD, no carotid bruits, no thyromegaly, no lymphadenopathy.  Lungs:Clear bilaterally, no wheezes, rhonci, crackles Cardiovascular: Regular rate and rhythm. No murmurs, gallops or rubs. Abdomen:Soft. Bowel sounds present. Non-tender.  Extremities: No lower extremity edema. Pulses are 2 + in the bilateral DP/PT.  Cardiac cath 06/12/11: 2. The left main is free of critical disease.  3. The LAD has a bifurcation stenosis. There is about 80% involvement  of the LAD and then 80% involvement of diagonal. There is 80%  lesion in the LAD just beyond the diagonal. The distal LAD is  suitable for grafting.  4. The circumflex provides a first marginal that has probably 80%  ostial and then a 70% mid lesion. The remaining  portion of the  circumflex is fairly large and is without critical narrowing other  than minor luminal irregularity.  5. The right coronary artery has clear-cut 70-80% ostial stenosis.  There is also a 60-70% mid vessel stenosis. The PDA and PLA are  suitable for grafting.  6.  Ventriculography in the RAO projection reveals well-preserved left  ventricular function with a normal ejection fraction 55% by  estimate. I did not appreciate significant regurgitation.  Echo 07/29/13: Left ventricle: The cavity size was normal. Wall thickness was increased in a pattern of mild LVH. Systolic function was normal. The estimated ejection fraction was in the range of 50% to 55%. Doppler parameters are consistent with abnormal left ventricular relaxation (grade 1 diastolic dysfunction). - Pulmonary arteries: PA peak pressure: 2m Hg (S).  EKG:  EKG is ordered today. The ekg ordered today demonstrates sinus brady, rate 57 bpm. LBBB  Recent Labs: 08/07/2017: ALT 18; BUN 23; Creatinine, Ser 1.01; Hemoglobin 14.4; Platelets 245; Potassium 4.0; Sodium 139   Lipid Panel No results found for: CHOL, TRIG, HDL, CHOLHDL, VLDL, LDLCALC, LDLDIRECT   Wt Readings from Last 3 Encounters:  10/11/17 200 lb (90.7 kg)  08/07/17 190 lb (86.2 kg)  04/20/17 196 lb 4.8 oz (89 kg)     Other studies Reviewed: Additional studies/ records that were reviewed today include: . Review of the above records demonstrates:    Assessment and Plan:   1. CAD s/p CABG without angina: No chest pain. Will continue ASA, statin and beta blocker.    2. HTN: BP is controlled. No changes today. .   3. Hyperlipidemia: Continue statin. Lipids followed in primary care office  4. LBBB: Chronic.   5. Carotid artery disease: Mild by dopplers December 2018  6. Chronic diastolic CHF: volume stable. Continue Lasix  Current medicines are reviewed at length with the patient today.  The patient does not have concerns regarding medicines.  The following changes have been made:  no change  Labs/ tests ordered today include:   Orders Placed This Encounter  Procedures  . EKG 12-Lead    Disposition:   FU with me in 12  months  Signed, CLauree Chandler MD 10/11/2017 3:13 PM    [facility] [address] [place]  [address]Phone: [phone] Fax: [phone]

## 2017-10-11 NOTE — Patient Instructions (Signed)

## 2017-11-04 ENCOUNTER — Other Ambulatory Visit: Payer: Self-pay | Admitting: Physician Assistant

## 2017-11-06 ENCOUNTER — Other Ambulatory Visit: Payer: Self-pay | Admitting: Cardiovascular Disease

## 2017-11-06 MED ORDER — POTASSIUM CHLORIDE CRYS ER 10 MEQ PO TBCR: 10.0000 meq | EXTENDED_RELEASE_TABLET | Freq: Every day | ORAL | 3 refills | Status: AC

## 2017-11-07 ENCOUNTER — Ambulatory Visit: Payer: Medicare Other | Admitting: Neurology

## 2017-11-27 ENCOUNTER — Ambulatory Visit (INDEPENDENT_AMBULATORY_CARE_PROVIDER_SITE_OTHER): Payer: Medicare Other | Admitting: Neurology

## 2017-11-27 ENCOUNTER — Other Ambulatory Visit: Payer: Self-pay | Admitting: Endocrinology

## 2017-11-27 ENCOUNTER — Encounter (INDEPENDENT_AMBULATORY_CARE_PROVIDER_SITE_OTHER): Payer: Self-pay

## 2017-11-27 VITALS — BP 146/57 | HR 61 | Ht 64.0 in | Wt 197.0 lb

## 2017-11-27 DIAGNOSIS — I6523 Occlusion and stenosis of bilateral carotid arteries: Secondary | ICD-10-CM

## 2017-11-27 DIAGNOSIS — G4761 Periodic limb movement disorder: Secondary | ICD-10-CM | POA: Diagnosis not present

## 2017-11-27 DIAGNOSIS — G2581 Restless legs syndrome: Secondary | ICD-10-CM

## 2017-11-27 DIAGNOSIS — Z853 Personal history of malignant neoplasm of breast: Secondary | ICD-10-CM

## 2017-11-27 NOTE — Patient Instructions (Addendum)
Your exam is stable. Please try keep your feet up when sitting. Please stay active.  Requip can cause swelling. Another reason not to increase the requip.  Your gabapentin prescription is up to date.  Please work on weight loss.

## 2017-11-27 NOTE — Progress Notes (Signed)
Subjective:    Patient ID: [name] is a 84 y.o. female.  HPI     Interim history:   [name] is a very pleasant 84 year old right-handed woman with an underlying medical history of osteoporosis, breast cancer, status post left lumpectomy in March 2013, status post radiation therapy, history of coronary artery disease with status post 2 vessel CABG, type 2 diabetes, arthritis, diverticulitis, reflux disease, kidney stones, hyperlipidemia, hypertension, hypothyroidism, kidney stone, psoriasis, who presents for follow-up consultation of her RLS. The patient is unaccompanied today. I last saw her on 11/03/2016, at which time she reported doing okay with respect to her restless leg syndrome. She was trying to avoid caffeine which seemed to help. She was taking 3 mg of Requip at night and as needed gabapentin 100 mg strength only when she felt she had been on her feet a lot. She did sustain a recent fall. She was trying to get a walker out of the trunk of her car, went to urgent care, had x-ray done. I suggested she continue with her medication regimen for symptomatic treatment of her RLS. I suggested a one-year checkup routinely.  Today, 11/27/2017: She reports feeling okay, fairly stable on most days but some flareup from time to time in her RLS. Continues to take Requip 3 mg in the evening through her endocrinologist/PCP. She takes it typically 6:30 or 7:30 PM, bedtime is typically before 9 PM. She takes gabapentin 100 mg each night. She has finished her Arimidex last year in August, saw her oncologist and is now advised to follow-up as needed which is good news. She also saw her cardiologist recently last month with good results and stable findings. I reviewed the office note from her cardiologist as well as her oncologist. She has had more trouble with her right knee. She had arthroscopic surgery several years ago under Dr. Maureen Ralphs.   The patient's allergies, current medications, family  history, past medical history, past social history, past surgical history and problem list were reviewed and updated as appropriate.   Previously:  I saw her on 08/02/2016, at which time she reported that she could not tolerate Horizant, but she had also tapered off her ropinirole. I suggested that she continue with her ropinirole and that we add gabapentin as an adjunct. We talked about her sleep study results from 05/22/2016 at the time.    I first met her on 04/27/2016 at the request of her endocrinologist, at which time she reported a long-standing history of restless leg syndrome. She had been on ropinirole. I invited her for a sleep study. She had a baseline sleep study on 05/22/2016. I went over her test results with her in detail today. Sleep efficiency was markedly reduced at 42.4% with a short sleep latency but high wake after sleep onset. She had an increased percentage of stage II sleep, very small percentage of slow-wave sleep and absence of REM sleep. She had no significant sleep disordered breathing. Total AHI was 0.3 per hour. Average oxygen saturation was 98%, nadir was 83%. Time below 89% saturation was less than 1 minute. She did not have any significant PLMS.   04/27/2016: She reports a long-standing history restless leg syndrome. She has been on Requip/ropinirole. I reviewed your office note from 04/01/2016, which you kindly included. She has had symptoms of restless legs for over 25 years, with time she noted progression, symptoms started sometimes as early as 3 PM but it helps to stay active physically, some nights are  worse than others, she is noted to twitch her legs and her sleep quite vehemently at times per husband. She is not aware of any family history of restless legs. However, her son they have started having some symptoms she adds. She has been on Requip for years, probably over 10 or 15 years. She started with Brand name Requip and then switch to generic, always on 3 mg at  night, she takes it around 7 PM. Sometimes it does not seem to help at all, in the beginning it helped better. She had open heart surgery in 2012, sees her cardiologist now once a year, appointment pending for October of this year. Of note, she snores, she has woken up with a sense of gasping and panic infrequently. Her husband sleeps quite well, uses a CPAP machine. She has never had a sleep study. She does have daytime tiredness and fatigue as well as sleepiness sometimes. Epworth sleepiness score is 7 out of 24 today, her fatigue score is 39 out of 63. She has nocturia 2-3 times per night on average, denies morning headaches, does wake up with leg twitching fairly frequently and sometimes her legs twitch even when she is trying to walk. She has practically eliminated caffeine from her diet. She drinks alcohol infrequently and quit smoking many years ago, in 1989. She is retired from Engineer, petroleum, they have 1 grown son.  Her Past Medical History Is Significant For: Past Medical History:  Diagnosis Date  . Arthritis   . Breast cancer ([place]) 10/20/11   L breast, ER/PR+, HER2 -  . Breast cancer, left breast ([place]) 11/01/2011  . Bronchitis    hx of  . CAD (coronary artery disease)    LHC 06/14/11: LAD 80% and 80% after Dx, Dx 80% (Dx was small), oOM 80%, mOM 70% (OM was small), oRCA 70-80%, mRCA 60-70%, EF 55% - referred for CABG;   Echo 06/14/11: mild focal basal septal hypertrophy, grade 1 diast dysfxn, mild LAE, mild RVE, PASP 37.    . Carotid stenosis    dopplers 01/77: LICA 93-90%  . Diabetes mellitus    x 5-6 yrs  . Diverticulitis of colon with perforation   . DM2 (diabetes mellitus, type 2) ([place])   . GERD (gastroesophageal reflux disease)   . History of kidney stones   . HLD (hyperlipidemia)   . HTN (hypertension)   . Hypothyroidism   . Kidney stones   . Psoriasis   . RLS (restless legs syndrome)   . S/P CABG x 2 06/2011   Dr. Servando Snare (L[facility]LAD, [facility])  . S/P radiation therapy 12/20/11 -  01/10/12   Left Breast: 3009 cGy/ 16 Fractions    Her Past Surgical History Is Significant For: Past Surgical History:  Procedure Laterality Date  . ABDOMINAL HYSTERECTOMY     age 52's  . APPENDECTOMY    . arthroscopy right knee    . BREAST BIOPSY Left 11/04/2011   Benign  . BREAST BIOPSY Left 10/20/2011   Malignant  . breast biospy  11/04/11   Breast, Left, Needle Core Biopsy 6 Oclock, Benign Breast Tissue wtih Fibrocystic  Changes: Microcalcifications Identified, No Neoplasm or Malignancy Identified  . breast biospy  10/20/11   Breast, Left, Needle Core Biopsy LOQ: Invsive ductal Carcinoman  . BREAST LUMPECTOMY Left 11/22/11   Breast, Lumpectomy, Left with Sentinel Node Biopsy: Invasive High Grade Ductal Carcinoma : No lymphovascular Invasion: High  Grade Ductal Carcinoma In-Situ with Calcification and [place]edo Necrosis  . CARDIAC CATHETERIZATION  2012  .  COLON RESECTION  1977  . COLON SURGERY     part colectomy-  . COLONOSCOPY    . CORONARY ARTERY BYPASS GRAFT  10/12  . Off-pump coronary artery bypass grafting x2  06/16/2011  . SHOULDER ARTHROSCOPY WITH SUBACROMIAL DECOMPRESSION, ROTATOR CUFF REPAIR AND BICEP TENDON REPAIR Right 10/16/2014   Procedure: RIGHT SHOULDER ARTHROSCOPY WITH SUBACROMIAL DECOMPRESSION, DISTAL CLAVICLE RESECTION AND ROTATOR CUFF REPAIR ;  Surgeon: Marin Shutter, MD;  Location: [place];  Service: Orthopedics;  Laterality: Right;  . [place]    Her Family History Is Significant For: Family History  Problem Relation Age of Onset  . Cancer Mother        throat  . Heart disease Mother   . Heart disease Father   . Hypertension Brother   . Cancer Paternal Uncle     Her Social History Is Significant For: Social History   Socioeconomic History  . Marital status: Married    Spouse name: Not on file  . Number of children: 1  . Years of education: HS  . Highest education level: Not on file  Occupational History  . 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 Financial resource strain: Not on file  . Food insecurity:    Worry: Not on file    Inability: Not on file  . Transportation needs:    Medical: Not on file    Non-medical: Not on file  Tobacco Use  . Smoking status: Former Smoker    Packs/day: 1.00    Years: 6.00    Pack years: 6.00    Types: Cigarettes    Last attempt to quit: 09/05/1980    Years since quitting: 37.2  . Smokeless tobacco: Never Used  Substance and Sexual Activity  . Alcohol use: Yes    Comment: socially  . Drug use: No  . Sexual activity: Not on file    Comment: menarche 52, p2, 1 stillbirth, 1st preg age 42, HRT x many yrs  Lifestyle  . Physical activity:    Days per week: Not on file    Minutes per session: Not on file  . Stress: Not on file  Relationships  . Social connections:    Talks on phone: Not on file    Gets together: Not on file    Attends religious service: Not on file    Active member of club or organization: Not on file    Attends meetings of clubs or organizations: Not on file    Relationship status: Not on file  Other Topics Concern  . Not on file  Social History Narrative   Denies caffeine use     Her Allergies Are:  No Known Allergies:   Her Current Medications Are:  Outpatient Encounter Medications as of 11/27/2017  Medication Sig  . acetaminophen (TYLENOL) 500 MG tablet Take 500 mg by mouth every 6 (six) hours as needed for moderate pain.  Marland Kitchen aspirin 81 MG tablet Take 81 mg by mouth every evening.   Marland Kitchen CALCITRIOL PO Take 2 capsules by mouth daily.  . Cholecalciferol (VITAMIN D-3) 5000 UNITS TABS Take 1 tablet by mouth daily.  . CRESTOR 20 MG tablet Take 20 mg by mouth every evening.   . diazepam (VALIUM) 5 MG tablet Take 0.5-1 tablets (2.5-5 mg total) by mouth every 6 (six) hours as needed for muscle spasms or sedation. (Patient taking differently: Take 5 mg by mouth every 6 (  six) hours as needed for muscle spasms or  sedation. )  . esomeprazole (NEXIUM) 40 MG capsule Take 40 mg by mouth daily before breakfast.    . ezetimibe (ZETIA) 10 MG tablet Take 10 mg by mouth every evening.   . furosemide (LASIX) 40 MG tablet TAKE 1 TABLET DAILY  . gabapentin (NEURONTIN) 100 MG capsule TAKE 1 CAPSULE AT BEDTIME  AS NEEDED  . ibuprofen (ADVIL,MOTRIN) 200 MG tablet Take 400 mg by mouth every 6 (six) hours as needed for moderate pain (shoulder pain).  Marland Kitchen levothyroxine (SYNTHROID, LEVOTHROID) 137 MCG tablet Take 137 mcg by mouth daily before breakfast.  . losartan (COZAAR) 50 MG tablet Take 1 tablet (50 mg total) by mouth 2 (two) times daily.  . metoprolol (LOPRESSOR) 50 MG tablet Take 1.5 tablets (75 mg total) by mouth 2 (two) times daily. Please keep 07/01/16 appt for further refills  . Multiple Vitamin (MULTIVITAMIN) capsule Take 1 capsule by mouth every evening.   . Multiple Vitamins-Minerals (EYE VITAMINS) CAPS Take 1 capsule by mouth 2 (two) times daily.   . mupirocin ointment (BACTROBAN) 2 % Apply 1 application topically 2 (two) times daily.  . pioglitazone (ACTOS) 15 MG tablet Take 15 mg by mouth daily.  . potassium chloride (KLOR-CON M10) 10 MEQ tablet Take 1 tablet (10 mEq total) by mouth daily.  Marland Kitchen rOPINIRole (REQUIP) 3 MG tablet Take 3 mg by mouth at bedtime.   . [DISCONTINUED] albuterol (PROVENTIL HFA;VENTOLIN HFA) 108 (90 BASE) MCG/ACT inhaler Inhale 1-2 puffs into the lungs every 6 (six) hours as needed for wheezing or shortness of breath.  . [DISCONTINUED] anastrozole (ARIMIDEX) 1 MG tablet Take 1 tablet (1 mg total) by mouth daily. (Patient not taking: Reported on 08/07/2017)  . [DISCONTINUED] Biotin 5 MG CAPS Take 1 capsule by mouth daily.  . [DISCONTINUED] Calcium Carbonate (OS-CAL PO) Take 1 tablet by mouth daily.  . [DISCONTINUED] Canagliflozin (INVOKANA) 300 MG TABS Take 1 tablet by mouth every morning.   . [DISCONTINUED] cephALEXin (KEFLEX) 500 MG capsule Take 1 capsule (500 mg total) by mouth 3 (three)  times daily.  . [DISCONTINUED] Glucosamine-Chondroitin (GLUCOSAMINE CHONDR COMPLEX PO) Take 1 capsule by mouth 2 (two) times daily.  . [DISCONTINUED] metoprolol tartrate (LOPRESSOR) 50 MG tablet FOR DIRECTIONS ON HOW TO   TAKE THIS MEDICINE, READ   THE ENCLOSED MEDICATION    INFORMATION FORM  . [DISCONTINUED] ondansetron (ZOFRAN ODT) 8 MG disintegrating tablet Take 1 tablet (8 mg total) by mouth every 8 (eight) hours as needed for nausea or vomiting.  . [DISCONTINUED] oxyCODONE-acetaminophen (PERCOCET/ROXICET) 5-325 MG tablet Take 1-2 tablets by mouth every 4 (four) hours as needed for severe pain.  . [DISCONTINUED] tamsulosin (FLOMAX) 0.4 MG CAPS capsule Take 1 capsule (0.4 mg total) by mouth daily.   No facility-administered encounter medications on file as of 11/27/2017.   :  Review of Systems:  Out of a complete 14 point review of systems, all are reviewed and negative with the exception of these symptoms as listed below: Review of Systems  Neurological:       Pt presents today to discuss her sleep. Pt reports trouble sleeping. Pt is still taking gabapentin PRN along with requip daily.    Objective:  Neurological Exam  Physical Exam Physical Examination:   Vitals:   11/27/17 1013  BP: (!) 146/57  Pulse: 61   General Examination: The patient is a very pleasant 84 y.o. female in no acute distress. She appears well-developed and well-nourished and well  groomed.   HEENT: Normocephalic, atraumatic, pupils are equal, round and reactive to light and accommodation. Funduscopic exam is normal with sharp disc margins noted. She is status post bilateral cataract repairs. Extraocular tracking is good without limitation to gaze excursion or nystagmus noted. Normal smooth pursuit is noted. Hearing is grossly intact. Face is symmetric with normal facial animation and normal facial sensation. Speech is clear with no dysarthria noted. There is no hypophonia. There is no lip, neck/head, jaw or voice  tremor. Neck is supple with full range of passive and active motion. There are no carotid bruits on auscultation. Oropharynx exam reveals: mild mouth dryness, adequate dental hygiene and moderate airway crowding. Mallampati is class II. Tongue protrudes centrally and palate elevates symmetrically.   Chest: Clear to auscultation without wheezing, rhonchi or crackles noted.  Heart: S1+S2+0, regular and normal without murmurs, rubs or gallops noted.   Abdomen: Soft, non-tender and non-distended with normal bowel sounds appreciated on auscultation.  Extremities: There is 1 to 2+ pitting edema in the distal lower extremities bilaterally. She is wearing compression.   Skin: Warm and dry without trophic changes noted.   Musculoskeletal: exam reveals no obvious joint deformities, tenderness or joint swelling or erythema, with the exception of mild R knee tenderness, slightly larger than L.   Neurologically:  Mental status: The patient is awake, alert and oriented in all 4 spheres. Her immediate and remote memory, attention, language skills and fund of knowledge are appropriate. There is no evidence of aphasia, agnosia, apraxia or anomia. Speech is clear with normal prosody and enunciation. Thought process is linear. Mood is normal and affect is normal.  Cranial nerves II - XII are as described above under HEENT exam. In addition: shoulder shrug is normal with equal shoulder height noted. Motor exam: Normal bulk, strength and tone is noted. There is no drift, tremor or rebound. Reflexes are 1+ throughout. Fine motor skills and coordination: intact with normal finger taps, normal hand movements, normal rapid alternating patting, normal foot taps and normal foot agility.  Cerebellar testing: No dysmetria or intention tremor. There is no truncal or gait ataxia.  Sensory exam: intact to light touch in the upper and lower extremities.  Gait, station and balance: She stands easily. No veering to one side  is noted. No leaning to one side is noted. Posture is age-appropriate and stance is narrow based. Gait shows slightly cautious gait and very mild limp on the right.   Assessment and Plan:   In summary, [name] is a very pleasant 84 year old female with an underlying medical history of breast cancer, status post lumpectomy in March 2013, radiation therapy (finished Arimidex in 2018), history of osteoporosis, CAD with status post 2 vessel CABG, type 2 diabetes, arthritis with status post right knee arthroscopic surgery, diverticulitis, reflux disease, kidney stones, hyperlipidemia, obesity, psoriasis, hypothyroidism, and hypertension, who presents for follow-up consultation of her RLS. She has maintained treatment with ropinirole for years, probably about 10+ years through her PCP. She tried Horizant but had side effects, in November 2017 I suggested she try low-dose as needed gabapentin 100 mg strength at night. She uses this and believes it has helped. Avoiding caffeine also seems to help. She does not take the gabapentin every single night. Her prescription is up to date. She has a stable exam and symptoms are controlled but she has evidence of worsening of her lower extremity swelling. She has noted that ropinirole can make her drowsy and she is advised that  it can also cause swelling, in particular lower extremity edema. She has been on Lasix and uses compression socks but is advised to monitor this. Of note, she had a baseline sleep study and September 2017 which showed no significant sleep disordered breathing but she did have sleep disruption and poorly consolidated sleep, with sleep efficiency of only 42.4%. She did not achieve REM sleep. She is advised to continue with her medication regimen for symptomatic treatment of her RLS and history of PLMS. She is advised regarding sleepiness secondary to Requip and swelling. She is advised to monitor this and also discuss with PCP. She has been  up-to-date prescription for gabapentin for 90 days with refills. I suggested a one-year checkup from my end of things, sooner if needed, she can see one of our nurse practitioners next time. I answered all her questions today and she was in agreement. I spent 25 minutes in total face-to-face time with the patient, more than 50% of which was spent in counseling and coordination of care, reviewing test results, reviewing medication and discussing or reviewing the diagnosis of RLS, its prognosis and treatment options. Pertinent laboratory and imaging test results that were available during this visit with the patient were reviewed by me and considered in my medical decision making (see chart for details).

## 2017-12-18 ENCOUNTER — Encounter (HOSPITAL_COMMUNITY): Payer: Self-pay | Admitting: Family Medicine

## 2017-12-18 ENCOUNTER — Ambulatory Visit (INDEPENDENT_AMBULATORY_CARE_PROVIDER_SITE_OTHER): Payer: Medicare Other

## 2017-12-18 ENCOUNTER — Ambulatory Visit (HOSPITAL_COMMUNITY)
Admission: EM | Admit: 2017-12-18 | Discharge: 2017-12-18 | Disposition: A | Discharge status: Home / Self Care | Payer: Medicare Other | Attending: Internal Medicine | Admitting: Internal Medicine

## 2017-12-18 DIAGNOSIS — B9789 Other viral agents as the cause of diseases classified elsewhere: Secondary | ICD-10-CM

## 2017-12-18 DIAGNOSIS — J069 Acute upper respiratory infection, unspecified: Secondary | ICD-10-CM

## 2017-12-18 MED ORDER — FLUTICASONE PROPIONATE 50 MCG/ACT NA SUSP: 1.0000 | Freq: Every day | NASAL | 0 refills | Status: AC

## 2017-12-18 MED ORDER — BENZONATATE 200 MG PO CAPS: 200.0000 mg | ORAL_CAPSULE | Freq: Three times a day (TID) | ORAL | 0 refills | Status: AC

## 2017-12-18 NOTE — ED Triage Notes (Addendum)
Pt here for cough, left rib pain when she takes a deep breath since saturday. She has been taking allergy meds. She cant recall pulling a muscle. Denies fall.

## 2017-12-18 NOTE — ED Provider Notes (Signed)
[facility] [facility]    CSN: [id number] Arrival date & time: 12/18/17  1028     History   Chief Complaint Chief Complaint  Patient presents with  . Cough  . Chest Pain    HPI [name] is a 84 y.o. female history of CAD, diabetes mellitus, hypertension presenting today with concern for cough and left rib pain.  States that for the past 3 days she has had a cough that is been associated with pain in her left lower chest below her breast.  It worsens when she takes a deep breath as well as when she is trying to get in and out of bed.  She does not feel she has been coughing hard enough to cause a pulled muscle or any other movements or activity that could have strained her muscles.  Denies any fall or injury.  She did begin to take Zyrtec.  Tolerating oral intake well, denies any abdominal pain, nausea, vomiting, bowel movements are normal.  Also having some congestion as well as mild sore throat.  HPI  Past Medical History:  Diagnosis Date  . Arthritis   . Breast cancer ([place]) 10/20/11   L breast, ER/PR+, HER2 -  . Breast cancer, left breast ([place]) 11/01/2011  . Bronchitis    hx of  . CAD (coronary artery disease)    LHC 06/14/11: LAD 80% and 80% after Dx, Dx 80% (Dx was small), oOM 80%, mOM 70% (OM was small), oRCA 70-80%, mRCA 60-70%, EF 55% - referred for CABG;   Echo 06/14/11: mild focal basal septal hypertrophy, grade 1 diast dysfxn, mild LAE, mild RVE, PASP 37.    . Carotid stenosis    dopplers 74/12: LICA 87-86%  . Diabetes mellitus    x 5-6 yrs  . Diverticulitis of colon with perforation   . DM2 (diabetes mellitus, type 2) ([place])   . GERD (gastroesophageal reflux disease)   . History of kidney stones   . HLD (hyperlipidemia)   . HTN (hypertension)   . Hypothyroidism   . Kidney stones   . Psoriasis   . RLS (restless legs syndrome)   . S/P CABG x 2 06/2011   Dr. Servando Snare (L[facility]LAD, [facility])  . S/P radiation therapy 12/20/11 - 01/10/12   Left Breast: 4256 cGy/ 16  Fractions    Patient Active Problem List   Diagnosis Date Noted  . Bronchitis 05/13/2015  . Vaginal dryness 04/21/2014  . Osteoporosis 03/26/2012  . Malignant neoplasm of lower-outer quadrant of left breast of female, estrogen receptor positive ([place]) 11/28/2011  . Diabetes mellitus   . Breast cancer, left breast ([place]) 11/01/2011  . Breast cancer ([place]) 10/20/2011  . Edema 07/05/2011  . CAD (coronary artery disease)   . HTN (hypertension)   . HLD (hyperlipidemia)   . Carotid stenosis   . Kidney stones   . Pregnancy toxemia   . Diverticulitis of colon with perforation   . HYPERTENSION, BENIGN 06/04/2009  . LBBB 06/04/2009    Past Surgical History:  Procedure Laterality Date  . ABDOMINAL HYSTERECTOMY     age 35's  . APPENDECTOMY    . arthroscopy right knee    . BREAST BIOPSY Left 11/04/2011   Benign  . BREAST BIOPSY Left 10/20/2011   Malignant  . breast biospy  11/04/11   Breast, Left, Needle Core Biopsy 6 Oclock, Benign Breast Tissue wtih Fibrocystic  Changes: Microcalcifications Identified, No Neoplasm or Malignancy Identified  . breast biospy  10/20/11   Breast, Left, Needle  Core Biopsy LOQ: Invsive ductal Carcinoman  . BREAST LUMPECTOMY Left 11/22/11   Breast, Lumpectomy, Left with Sentinel Node Biopsy: Invasive High Grade Ductal Carcinoma : No lymphovascular Invasion: High  Grade Ductal Carcinoma In-Situ with Calcification and [place] Necrosis  . CARDIAC CATHETERIZATION  2012  . COLON RESECTION  1977  . COLON SURGERY     part colectomy-  . COLONOSCOPY    . CORONARY ARTERY BYPASS GRAFT  10/12  . Off-pump coronary artery bypass grafting x2  06/16/2011  . SHOULDER ARTHROSCOPY WITH SUBACROMIAL DECOMPRESSION, ROTATOR CUFF REPAIR AND BICEP TENDON REPAIR Right 10/16/2014   Procedure: RIGHT SHOULDER ARTHROSCOPY WITH SUBACROMIAL DECOMPRESSION, DISTAL CLAVICLE RESECTION AND ROTATOR CUFF REPAIR ;  Surgeon: Marin Shutter, MD;  Location: [facility];  Service: Orthopedics;  Laterality:  Right;  . VEIN BYPASS SURGERY  1999    OB History   None      Home Medications    Prior to Admission medications   Medication Sig Start Date End Date Taking? Authorizing Provider  acetaminophen (TYLENOL) 500 MG tablet Take 500 mg by mouth every 6 (six) hours as needed for moderate pain.    [provider]  aspirin 81 MG tablet Take 81 mg by mouth every evening.     [provider]  benzonatate (TESSALON) 200 MG capsule Take 1 capsule (200 mg total) by mouth every 8 (eight) hours. 12/18/17   Raine Elsass C, PA-C  CALCITRIOL PO Take 2 capsules by mouth daily.    [provider]  Cholecalciferol (VITAMIN D-3) 5000 UNITS TABS Take 1 tablet by mouth daily.    [provider]  CRESTOR 20 MG tablet Take 20 mg by mouth every evening.  11/10/11   [provider]  diazepam (VALIUM) 5 MG tablet Take 0.5-1 tablets (2.5-5 mg total) by mouth every 6 (six) hours as needed for muscle spasms or sedation. Patient taking differently: Take 5 mg by mouth every 6 (six) hours as needed for muscle spasms or sedation.  10/16/14   Shuford, Olivia Mackie, PA-C  esomeprazole (NEXIUM) 40 MG capsule Take 40 mg by mouth daily before breakfast.      [provider]  ezetimibe (ZETIA) 10 MG tablet Take 10 mg by mouth every evening.     [provider]  fluticasone (FLONASE) 50 MCG/ACT nasal spray Place 1 spray into both nostrils daily for 7 days. 12/18/17 12/25/17  Elliannah Wayment C, PA-C  furosemide (LASIX) 40 MG tablet TAKE 1 TABLET DAILY 11/06/17   Richardson Dopp T, PA-C  gabapentin (NEURONTIN) 100 MG capsule TAKE 1 CAPSULE AT BEDTIME  AS NEEDED 08/18/17   Star Age, MD  ibuprofen (ADVIL,MOTRIN) 200 MG tablet Take 400 mg by mouth every 6 (six) hours as needed for moderate pain (shoulder pain).    [provider]  levothyroxine (SYNTHROID, LEVOTHROID) 137 MCG tablet Take 137 mcg by mouth daily before breakfast.    [provider]  losartan (COZAAR)  50 MG tablet Take 1 tablet (50 mg total) by mouth 2 (two) times daily. 10/11/17   Burnell Blanks, MD  metoprolol (LOPRESSOR) 50 MG tablet Take 1.5 tablets (75 mg total) by mouth 2 (two) times daily. Please keep 07/01/16 appt for further refills 05/10/16   Burnell Blanks, MD  Multiple Vitamin (MULTIVITAMIN) capsule Take 1 capsule by mouth every evening.     [provider]  Multiple Vitamins-Minerals (EYE VITAMINS) CAPS Take 1 capsule by mouth 2 (two) times daily.     [provider]  mupirocin ointment (BACTROBAN) 2 % Apply 1 application topically 2 (two) times daily. 09/26/16   Trula Slade, DPM  pioglitazone (ACTOS) 15 MG tablet Take 15 mg by mouth daily.    [provider]  potassium chloride (KLOR-CON M10) 10 MEQ tablet Take 1 tablet (10 mEq total) by mouth daily. 11/06/17   Burnell Blanks, MD  rOPINIRole (REQUIP) 3 MG tablet Take 3 mg by mouth at bedtime.     [provider]    Family History Family History  Problem Relation Age of Onset  . Cancer Mother        throat  . Heart disease Mother   . Heart disease Father   . Hypertension Brother   . Cancer Paternal Uncle     Social History Social History   Tobacco Use  . Smoking status: Former Smoker    Packs/day: 1.00    Years: 6.00    Pack years: 6.00    Types: Cigarettes    Last attempt to quit: 09/05/1980    Years since quitting: 37.3  . Smokeless tobacco: Never Used  Substance Use Topics  . Alcohol use: Yes    Comment: socially  . Drug use: No     Allergies   Patient has no known allergies.   Review of Systems Review of Systems  Constitutional: Negative for chills, fatigue and fever.  HENT: Positive for congestion, rhinorrhea and sore throat. Negative for ear pain, sinus pressure and trouble swallowing.   Respiratory: Positive for cough. Negative for chest tightness and shortness of breath.   Cardiovascular: Positive for chest pain.  Gastrointestinal:  Negative for abdominal pain, nausea and vomiting.  Musculoskeletal: Negative for myalgias.  Skin: Negative for rash.  Neurological: Negative for dizziness, light-headedness and headaches.     Physical Exam Triage Vital Signs ED Triage Vitals  Enc Vitals Group     BP 12/18/17 1110 (!) 155/71     Pulse Rate 12/18/17 1110 66     Resp 12/18/17 1110 16     Temp 12/18/17 1110 98.9 F (37.2 C)     Temp Source 12/18/17 1110 Oral     SpO2 12/18/17 1110 95 %     Weight --      Height --      Head Circumference --      Peak Flow --      Pain Score 12/18/17 1113 5     Pain Loc --      Pain Edu? --      Excl. in [place]? --    No data found.  Updated Vital Signs BP (!) 155/71 (BP Location: Right Arm)   Pulse 66   Temp 98.9 F (37.2 C) (Oral)   Resp 16   SpO2 95%   Visual Acuity Right Eye Distance:   Left Eye Distance:   Bilateral Distance:    Right Eye Near:   Left Eye Near:    Bilateral Near:     Physical Exam  Constitutional: She appears well-developed and well-nourished. No distress.  HENT:  Head: Normocephalic and atraumatic.  Bilateral TMs nonerythematous, nasal mucosa erythematous with clear rhinorrhea present, posterior oropharynx minimally erythematous, no tonsillar enlargement or exudate.  Eyes: Conjunctivae are normal.  Neck: Neck supple.  Cardiovascular: Normal rate and regular rhythm.  No murmur heard. Pulmonary/Chest: Effort normal and breath sounds normal. No respiratory distress.  Breathing comfortably at rest, CTA BL, no adventitious sounds auscultated bilaterally  Abdominal: Soft. There is no tenderness.  Nontender to light and deep palpation  Musculoskeletal: She exhibits no edema.  Neurological: She is alert.  Skin: Skin is warm and dry.  Psychiatric: She has a normal mood and affect.  Nursing note and vitals reviewed.    UC Treatments / Results  Labs (all labs ordered are listed, but only abnormal results are displayed) Labs Reviewed - No data  to display  EKG None Radiology Dg Chest 2 View  Result Date: 12/18/2017 CLINICAL DATA:  Chest pain. EXAM: CHEST - 2 VIEW COMPARISON:  Radiographs of May 13, 2015. FINDINGS: Stable cardiomediastinal silhouette. Status post coronary artery bypass graft. Atherosclerosis of thoracic aorta is noted. No pneumothorax or pleural effusion is noted. Both lungs are clear. The visualized skeletal structures are unremarkable. IMPRESSION: No active cardiopulmonary disease. Aortic Atherosclerosis (ICD10-I70.0). Electronically Signed   By: Marijo Conception, M.D.   On: 12/18/2017 12:18    Procedures Procedures (including critical care time)  Medications Ordered in UC Medications - No data to display   Initial Impression / Assessment and Plan / UC Course  I have reviewed the triage vital signs and the nursing notes.  Pertinent labs & imaging results that were available during my care of the patient were reviewed by me and considered in my medical decision making (see chart for details).     Chest x-ray negative for pneumonia.  Left rib discomfort likely musculoskeletal.  Cough and congestion likely related to allergies versus viral URI.  Will recommend symptomatic management.  Add in Flonase to daily Zyrtec.  Tessalon for cough.  Tylenol and ibuprofen for chest discomfort. Discussed strict return precautions. Patient verbalized understanding and is agreeable with plan.   Final Clinical Impressions(s) / UC Diagnoses   Final diagnoses:  Viral URI with cough    ED Discharge Orders        Ordered    fluticasone (FLONASE) 50 MCG/ACT nasal spray  Daily     12/18/17 1225    benzonatate (TESSALON) 200 MG capsule  Every 8 hours     12/18/17 1225       Controlled Substance Prescriptions [place] Controlled Substance Registry consulted? Not Applicable   Janith Lima, [place] 12/18/17 1237

## 2017-12-18 NOTE — Discharge Instructions (Addendum)
Chest x-ray was negative for pneumonia.  Her symptoms are likely a viral cold or related to allergies.  Please continue daily Zyrtec.  You may try Flonase nasal spray is having a lot of nasal congestion.  For cough you may use Tessalon, or you may use over-the-counter Delsym, Robitussin or Robitussin-DM as this is cheaper.  Please take Tylenol and/or ibuprofen to help with your chest discomfort.  Please return if symptoms worsening, not improving in approximately 1 week or changing.

## 2017-12-20 ENCOUNTER — Ambulatory Visit
Admission: RE | Admit: 2017-12-20 | Discharge: 2017-12-20 | Disposition: A | Discharge status: Home / Self Care | Payer: Medicare Other | Source: Ambulatory Visit | Attending: Endocrinology | Admitting: Endocrinology

## 2017-12-20 DIAGNOSIS — Z853 Personal history of malignant neoplasm of breast: Secondary | ICD-10-CM

## 2017-12-20 HISTORY — DX: Personal history of irradiation: Z92.3

## 2017-12-24 ENCOUNTER — Other Ambulatory Visit: Payer: Self-pay

## 2017-12-24 ENCOUNTER — Inpatient Hospital Stay (HOSPITAL_COMMUNITY): Payer: Medicare Other

## 2017-12-24 ENCOUNTER — Encounter (HOSPITAL_COMMUNITY): Payer: Self-pay | Admitting: *Deleted

## 2017-12-24 ENCOUNTER — Emergency Department (HOSPITAL_COMMUNITY): Payer: Medicare Other

## 2017-12-24 ENCOUNTER — Inpatient Hospital Stay (HOSPITAL_COMMUNITY)
Admission: EM | Admit: 2017-12-24 | Discharge: 2017-12-26 | DRG: 835 | Disposition: A | Discharge status: Home / Self Care | Payer: Medicare Other | Attending: Family Medicine | Admitting: Family Medicine

## 2017-12-24 DIAGNOSIS — M129 Arthropathy, unspecified: Secondary | ICD-10-CM

## 2017-12-24 DIAGNOSIS — Z9049 Acquired absence of other specified parts of digestive tract: Secondary | ICD-10-CM

## 2017-12-24 DIAGNOSIS — M25512 Pain in left shoulder: Secondary | ICD-10-CM | POA: Diagnosis present

## 2017-12-24 DIAGNOSIS — K219 Gastro-esophageal reflux disease without esophagitis: Secondary | ICD-10-CM | POA: Diagnosis present

## 2017-12-24 DIAGNOSIS — I340 Nonrheumatic mitral (valve) insufficiency: Secondary | ICD-10-CM | POA: Diagnosis not present

## 2017-12-24 DIAGNOSIS — Z7982 Long term (current) use of aspirin: Secondary | ICD-10-CM

## 2017-12-24 DIAGNOSIS — Z923 Personal history of irradiation: Secondary | ICD-10-CM

## 2017-12-24 DIAGNOSIS — D6959 Other secondary thrombocytopenia: Secondary | ICD-10-CM | POA: Diagnosis present

## 2017-12-24 DIAGNOSIS — E119 Type 2 diabetes mellitus without complications: Secondary | ICD-10-CM | POA: Diagnosis present

## 2017-12-24 DIAGNOSIS — E785 Hyperlipidemia, unspecified: Secondary | ICD-10-CM

## 2017-12-24 DIAGNOSIS — M1990 Unspecified osteoarthritis, unspecified site: Secondary | ICD-10-CM | POA: Diagnosis present

## 2017-12-24 DIAGNOSIS — G2581 Restless legs syndrome: Secondary | ICD-10-CM | POA: Diagnosis not present

## 2017-12-24 DIAGNOSIS — M546 Pain in thoracic spine: Secondary | ICD-10-CM | POA: Diagnosis present

## 2017-12-24 DIAGNOSIS — I110 Hypertensive heart disease with heart failure: Secondary | ICD-10-CM | POA: Diagnosis present

## 2017-12-24 DIAGNOSIS — I361 Nonrheumatic tricuspid (valve) insufficiency: Secondary | ICD-10-CM | POA: Diagnosis not present

## 2017-12-24 DIAGNOSIS — I272 Pulmonary hypertension, unspecified: Secondary | ICD-10-CM | POA: Diagnosis not present

## 2017-12-24 DIAGNOSIS — Z7984 Long term (current) use of oral hypoglycemic drugs: Secondary | ICD-10-CM

## 2017-12-24 DIAGNOSIS — K5720 Diverticulitis of large intestine with perforation and abscess without bleeding: Secondary | ICD-10-CM | POA: Diagnosis present

## 2017-12-24 DIAGNOSIS — Z79890 Hormone replacement therapy (postmenopausal): Secondary | ICD-10-CM

## 2017-12-24 DIAGNOSIS — L409 Psoriasis, unspecified: Secondary | ICD-10-CM | POA: Diagnosis not present

## 2017-12-24 DIAGNOSIS — Z87442 Personal history of urinary calculi: Secondary | ICD-10-CM

## 2017-12-24 DIAGNOSIS — D696 Thrombocytopenia, unspecified: Secondary | ICD-10-CM

## 2017-12-24 DIAGNOSIS — D72828 Other elevated white blood cell count: Secondary | ICD-10-CM | POA: Diagnosis not present

## 2017-12-24 DIAGNOSIS — Z853 Personal history of malignant neoplasm of breast: Secondary | ICD-10-CM | POA: Diagnosis not present

## 2017-12-24 DIAGNOSIS — Z8719 Personal history of other diseases of the digestive system: Secondary | ICD-10-CM

## 2017-12-24 DIAGNOSIS — I6523 Occlusion and stenosis of bilateral carotid arteries: Secondary | ICD-10-CM | POA: Diagnosis present

## 2017-12-24 DIAGNOSIS — Z951 Presence of aortocoronary bypass graft: Secondary | ICD-10-CM | POA: Diagnosis not present

## 2017-12-24 DIAGNOSIS — R1010 Upper abdominal pain, unspecified: Secondary | ICD-10-CM | POA: Diagnosis not present

## 2017-12-24 DIAGNOSIS — N179 Acute kidney failure, unspecified: Secondary | ICD-10-CM | POA: Diagnosis not present

## 2017-12-24 DIAGNOSIS — E860 Dehydration: Secondary | ICD-10-CM

## 2017-12-24 DIAGNOSIS — Z8249 Family history of ischemic heart disease and other diseases of the circulatory system: Secondary | ICD-10-CM

## 2017-12-24 DIAGNOSIS — Z90710 Acquired absence of both cervix and uterus: Secondary | ICD-10-CM

## 2017-12-24 DIAGNOSIS — Z79899 Other long term (current) drug therapy: Secondary | ICD-10-CM

## 2017-12-24 DIAGNOSIS — D72829 Elevated white blood cell count, unspecified: Secondary | ICD-10-CM

## 2017-12-24 DIAGNOSIS — D649 Anemia, unspecified: Secondary | ICD-10-CM | POA: Diagnosis present

## 2017-12-24 DIAGNOSIS — M549 Dorsalgia, unspecified: Secondary | ICD-10-CM

## 2017-12-24 DIAGNOSIS — C9590 Leukemia, unspecified not having achieved remission: Secondary | ICD-10-CM | POA: Diagnosis present

## 2017-12-24 DIAGNOSIS — I708 Atherosclerosis of other arteries: Secondary | ICD-10-CM | POA: Diagnosis not present

## 2017-12-24 DIAGNOSIS — E039 Hypothyroidism, unspecified: Secondary | ICD-10-CM | POA: Diagnosis present

## 2017-12-24 DIAGNOSIS — I447 Left bundle-branch block, unspecified: Secondary | ICD-10-CM | POA: Diagnosis present

## 2017-12-24 DIAGNOSIS — I509 Heart failure, unspecified: Secondary | ICD-10-CM | POA: Diagnosis present

## 2017-12-24 DIAGNOSIS — M810 Age-related osteoporosis without current pathological fracture: Secondary | ICD-10-CM | POA: Diagnosis not present

## 2017-12-24 DIAGNOSIS — M25511 Pain in right shoulder: Secondary | ICD-10-CM | POA: Diagnosis present

## 2017-12-24 DIAGNOSIS — Z808 Family history of malignant neoplasm of other organs or systems: Secondary | ICD-10-CM

## 2017-12-24 DIAGNOSIS — K5792 Diverticulitis of intestine, part unspecified, without perforation or abscess without bleeding: Secondary | ICD-10-CM

## 2017-12-24 DIAGNOSIS — I700 Atherosclerosis of aorta: Secondary | ICD-10-CM

## 2017-12-24 DIAGNOSIS — R112 Nausea with vomiting, unspecified: Secondary | ICD-10-CM | POA: Diagnosis not present

## 2017-12-24 DIAGNOSIS — Z87891 Personal history of nicotine dependence: Secondary | ICD-10-CM

## 2017-12-24 DIAGNOSIS — C9500 Acute leukemia of unspecified cell type not having achieved remission: Secondary | ICD-10-CM | POA: Diagnosis present

## 2017-12-24 DIAGNOSIS — Z809 Family history of malignant neoplasm, unspecified: Secondary | ICD-10-CM

## 2017-12-24 DIAGNOSIS — I2510 Atherosclerotic heart disease of native coronary artery without angina pectoris: Secondary | ICD-10-CM | POA: Diagnosis present

## 2017-12-24 DIAGNOSIS — Z800 Family history of malignant neoplasm of digestive organs: Secondary | ICD-10-CM

## 2017-12-24 DIAGNOSIS — I6529 Occlusion and stenosis of unspecified carotid artery: Secondary | ICD-10-CM | POA: Diagnosis present

## 2017-12-24 DIAGNOSIS — I10 Essential (primary) hypertension: Secondary | ICD-10-CM

## 2017-12-24 DIAGNOSIS — Z791 Long term (current) use of non-steroidal anti-inflammatories (NSAID): Secondary | ICD-10-CM

## 2017-12-24 LAB — COMPREHENSIVE METABOLIC PANEL
ALT: 15 U/L (ref 14–54)
AST: 21 U/L (ref 15–41)
Albumin: 3.2 g/dL — ABNORMAL LOW (ref 3.5–5.0)
Alkaline Phosphatase: 81 U/L (ref 38–126)
Anion gap: 13 (ref 5–15)
BUN: 28 mg/dL — ABNORMAL HIGH (ref 6–20)
CO2: 20 mmol/L — ABNORMAL LOW (ref 22–32)
Calcium: 9.1 mg/dL (ref 8.9–10.3)
Chloride: 103 mmol/L (ref 101–111)
Creatinine, Ser: 1.25 mg/dL — ABNORMAL HIGH (ref 0.44–1.00)
GFR calc Af Amer: 45 mL/min — ABNORMAL LOW (ref >60)
GFR calc non Af Amer: 39 mL/min — ABNORMAL LOW (ref >60)
Glucose, Bld: 136 mg/dL — ABNORMAL HIGH (ref 65–99)
Potassium: 4.2 mmol/L (ref 3.5–5.1)
Sodium: 136 mmol/L (ref 135–145)
Total Bilirubin: 0.8 mg/dL (ref 0.3–1.2)
Total Protein: 6.4 g/dL — ABNORMAL LOW (ref 6.5–8.1)

## 2017-12-24 LAB — CBC WITH DIFFERENTIAL/PLATELET
Band Neutrophils: 0 %
Basophils Absolute: 0 10*3/uL (ref 0.0–0.1)
Basophils Relative: 0 %
Blasts: 47 %
Eosinophils Absolute: 0 10*3/uL (ref 0.0–0.7)
Eosinophils Relative: 0 %
HCT: 33.9 % — ABNORMAL LOW (ref 36.0–46.0)
Hemoglobin: 11.1 g/dL — ABNORMAL LOW (ref 12.0–15.0)
Lymphocytes Relative: 29 %
Lymphs Abs: 19.7 10*3/uL — ABNORMAL HIGH (ref 0.7–4.0)
MCH: 31.1 pg (ref 26.0–34.0)
MCHC: 32.7 g/dL (ref 30.0–36.0)
MCV: 95 fL (ref 78.0–100.0)
Metamyelocytes Relative: 6 %
Monocytes Absolute: 3.4 10*3/uL — ABNORMAL HIGH (ref 0.1–1.0)
Monocytes Relative: 5 %
Myelocytes: 6 %
Neutro Abs: 12.9 10*3/uL — ABNORMAL HIGH (ref 1.7–7.7)
Neutrophils Relative %: 7 %
Other: 0 %
Platelets: 66 10*3/uL — ABNORMAL LOW (ref 150–400)
Promyelocytes Relative: 0 %
RBC: 3.57 MIL/uL — ABNORMAL LOW (ref 3.87–5.11)
RDW: 17.1 % — ABNORMAL HIGH (ref 11.5–15.5)
WBC: 68.1 10*3/uL (ref 4.0–10.5)
nRBC: 0 /100 WBC

## 2017-12-24 LAB — GLUCOSE, CAPILLARY
GLUCOSE-CAPILLARY: 192 mg/dL — AB (ref 65–99)
Glucose-Capillary: 109 mg/dL — ABNORMAL HIGH (ref 65–99)

## 2017-12-24 LAB — I-STAT TROPONIN, ED: Troponin i, poc: 0 ng/mL (ref 0.00–0.08)

## 2017-12-24 LAB — BRAIN NATRIURETIC PEPTIDE: B Natriuretic Peptide: 184.2 pg/mL — ABNORMAL HIGH (ref 0.0–100.0)

## 2017-12-24 MED ORDER — ACETAMINOPHEN 500 MG PO TABS: 500.0000 mg | ORAL_TABLET | Freq: Four times a day (QID) | ORAL | Status: DC | PRN

## 2017-12-24 MED ORDER — OCUVITE-LUTEIN PO CAPS
1.0000 | ORAL_CAPSULE | Freq: Two times a day (BID) | ORAL | Status: DC
  Filled 2017-12-24 (×3): qty 1

## 2017-12-24 MED ORDER — FLUTICASONE PROPIONATE 50 MCG/ACT NA SUSP
1.0000 | Freq: Every day | NASAL | Status: DC
  Administered 2017-12-24 – 2017-12-26 (×3): 1 via NASAL
  Filled 2017-12-24 (×2): qty 16

## 2017-12-24 MED ORDER — INSULIN ASPART 100 UNIT/ML ~~LOC~~ SOLN: 0.0000 [IU] | Freq: Every day | SUBCUTANEOUS | Status: DC

## 2017-12-24 MED ORDER — SODIUM CHLORIDE 0.9% FLUSH
3.0000 mL | Freq: Two times a day (BID) | INTRAVENOUS | Status: DC
  Administered 2017-12-24 – 2017-12-26 (×4): 3 mL via INTRAVENOUS

## 2017-12-24 MED ORDER — METOPROLOL TARTRATE 50 MG PO TABS
75.0000 mg | ORAL_TABLET | Freq: Two times a day (BID) | ORAL | Status: DC
  Administered 2017-12-24 – 2017-12-26 (×4): 75 mg via ORAL
  Filled 2017-12-24 (×4): qty 1

## 2017-12-24 MED ORDER — EZETIMIBE 10 MG PO TABS
10.0000 mg | ORAL_TABLET | Freq: Every evening | ORAL | Status: DC
  Administered 2017-12-24 – 2017-12-25 (×2): 10 mg via ORAL
  Filled 2017-12-24 (×3): qty 1

## 2017-12-24 MED ORDER — ACETAMINOPHEN 650 MG RE SUPP: 650.0000 mg | Freq: Four times a day (QID) | RECTAL | Status: DC | PRN

## 2017-12-24 MED ORDER — INSULIN ASPART 100 UNIT/ML ~~LOC~~ SOLN
0.0000 [IU] | Freq: Three times a day (TID) | SUBCUTANEOUS | Status: DC
  Administered 2017-12-25: 2 [IU] via SUBCUTANEOUS
  Administered 2017-12-25: 1 [IU] via SUBCUTANEOUS
  Administered 2017-12-25: 2 [IU] via SUBCUTANEOUS

## 2017-12-24 MED ORDER — IBUPROFEN 200 MG PO TABS: 400.0000 mg | ORAL_TABLET | Freq: Four times a day (QID) | ORAL | Status: DC | PRN

## 2017-12-24 MED ORDER — FUROSEMIDE 20 MG PO TABS
20.0000 mg | ORAL_TABLET | Freq: Every day | ORAL | Status: DC
  Administered 2017-12-24 – 2017-12-25 (×2): 20 mg via ORAL
  Filled 2017-12-24 (×2): qty 1

## 2017-12-24 MED ORDER — POTASSIUM CHLORIDE CRYS ER 10 MEQ PO TBCR
10.0000 meq | EXTENDED_RELEASE_TABLET | Freq: Every day | ORAL | Status: DC
  Administered 2017-12-25 – 2017-12-26 (×2): 10 meq via ORAL
  Filled 2017-12-24 (×2): qty 1

## 2017-12-24 MED ORDER — ACETAMINOPHEN 325 MG PO TABS: 650.0000 mg | ORAL_TABLET | Freq: Four times a day (QID) | ORAL | Status: DC | PRN

## 2017-12-24 MED ORDER — MORPHINE SULFATE (PF) 2 MG/ML IV SOLN
2.0000 mg | Freq: Once | INTRAVENOUS | Status: AC
  Administered 2017-12-24: 2 mg via INTRAVENOUS
  Filled 2017-12-24: qty 1

## 2017-12-24 MED ORDER — LEVOTHYROXINE SODIUM 137 MCG PO TABS
137.0000 ug | ORAL_TABLET | Freq: Every day | ORAL | Status: DC
  Administered 2017-12-25 – 2017-12-26 (×2): 137 ug via ORAL
  Filled 2017-12-24 (×2): qty 1

## 2017-12-24 MED ORDER — ONDANSETRON HCL 4 MG/2ML IJ SOLN
4.0000 mg | Freq: Four times a day (QID) | INTRAMUSCULAR | Status: DC | PRN
  Administered 2017-12-26: 4 mg via INTRAVENOUS
  Filled 2017-12-24: qty 2

## 2017-12-24 MED ORDER — MORPHINE SULFATE (PF) 4 MG/ML IV SOLN
4.0000 mg | Freq: Once | INTRAVENOUS | Status: AC
  Administered 2017-12-24: 4 mg via INTRAVENOUS
  Filled 2017-12-24: qty 1

## 2017-12-24 MED ORDER — LOSARTAN POTASSIUM 50 MG PO TABS
50.0000 mg | ORAL_TABLET | Freq: Two times a day (BID) | ORAL | Status: DC
  Administered 2017-12-24 – 2017-12-25 (×2): 50 mg via ORAL
  Filled 2017-12-24 (×2): qty 1

## 2017-12-24 MED ORDER — ROPINIROLE HCL 1 MG PO TABS
3.0000 mg | ORAL_TABLET | Freq: Every day | ORAL | Status: DC
  Administered 2017-12-25: 3 mg via ORAL
  Filled 2017-12-24 (×3): qty 3

## 2017-12-24 MED ORDER — IOPAMIDOL (ISOVUE-370) INJECTION 76%
INTRAVENOUS | Status: AC
Start: 2017-12-24 — End: 2017-12-25
  Filled 2017-12-24: qty 100

## 2017-12-24 MED ORDER — ROSUVASTATIN CALCIUM 20 MG PO TABS
20.0000 mg | ORAL_TABLET | Freq: Every evening | ORAL | Status: DC
  Administered 2017-12-24 – 2017-12-25 (×2): 20 mg via ORAL
  Filled 2017-12-24 (×3): qty 1

## 2017-12-24 MED ORDER — GABAPENTIN 100 MG PO CAPS
100.0000 mg | ORAL_CAPSULE | Freq: Every day | ORAL | Status: DC
  Administered 2017-12-24 – 2017-12-25 (×2): 100 mg via ORAL
  Filled 2017-12-24 (×2): qty 1

## 2017-12-24 MED ORDER — CALCIUM CARBONATE 1250 (500 CA) MG PO TABS
1.0000 | ORAL_TABLET | Freq: Every day | ORAL | Status: DC
  Administered 2017-12-24 – 2017-12-25 (×2): 500 mg via ORAL
  Filled 2017-12-24 (×3): qty 1

## 2017-12-24 MED ORDER — HYDROCODONE-ACETAMINOPHEN 5-325 MG PO TABS
1.0000 | ORAL_TABLET | Freq: Four times a day (QID) | ORAL | Status: DC | PRN
  Administered 2017-12-25: 2 via ORAL
  Filled 2017-12-24: qty 2

## 2017-12-24 MED ORDER — BIOTIN 5 MG PO TABS: ORAL_TABLET | Freq: Every day | ORAL | Status: DC

## 2017-12-24 MED ORDER — ADULT MULTIVITAMIN W/MINERALS CH
1.0000 | ORAL_TABLET | Freq: Every evening | ORAL | Status: DC
  Administered 2017-12-24 – 2017-12-25 (×2): 1 via ORAL
  Filled 2017-12-24: qty 1

## 2017-12-24 MED ORDER — TRAZODONE HCL 50 MG PO TABS
50.0000 mg | ORAL_TABLET | Freq: Once | ORAL | Status: AC
  Administered 2017-12-24: 50 mg via ORAL
  Filled 2017-12-24: qty 1

## 2017-12-24 MED ORDER — CANAGLIFLOZIN 300 MG PO TABS
300.0000 mg | ORAL_TABLET | Freq: Every day | ORAL | Status: DC
  Filled 2017-12-24: qty 1

## 2017-12-24 MED ORDER — PANTOPRAZOLE SODIUM 40 MG PO TBEC
40.0000 mg | DELAYED_RELEASE_TABLET | Freq: Every day | ORAL | Status: DC
  Administered 2017-12-24 – 2017-12-26 (×3): 40 mg via ORAL
  Filled 2017-12-24 (×2): qty 1

## 2017-12-24 MED ORDER — PIOGLITAZONE HCL 15 MG PO TABS
15.0000 mg | ORAL_TABLET | Freq: Every day | ORAL | Status: DC
  Administered 2017-12-25 – 2017-12-26 (×2): 15 mg via ORAL
  Filled 2017-12-24 (×3): qty 1

## 2017-12-24 MED ORDER — ASPIRIN 81 MG PO CHEW
81.0000 mg | CHEWABLE_TABLET | Freq: Every evening | ORAL | Status: DC
  Administered 2017-12-24 – 2017-12-25 (×2): 81 mg via ORAL
  Filled 2017-12-24 (×2): qty 1

## 2017-12-24 MED ORDER — ONDANSETRON HCL 4 MG PO TABS: 4.0000 mg | ORAL_TABLET | Freq: Four times a day (QID) | ORAL | Status: DC | PRN

## 2017-12-24 MED ORDER — VITAMIN D3 25 MCG (1000 UNIT) PO TABS
5000.0000 [IU] | ORAL_TABLET | Freq: Every day | ORAL | Status: DC
  Administered 2017-12-25 – 2017-12-26 (×2): 5000 [IU] via ORAL
  Filled 2017-12-24 (×2): qty 5

## 2017-12-24 MED ORDER — IOPAMIDOL (ISOVUE-370) INJECTION 76%
100.0000 mL | Freq: Once | INTRAVENOUS | Status: AC | PRN
  Administered 2017-12-24: 80 mL via INTRAVENOUS

## 2017-12-24 NOTE — ED Notes (Signed)
ED Provider at bedside. 

## 2017-12-24 NOTE — ED Triage Notes (Signed)
Patient experienced chest pain radiating to both arms and top of back at approx 0800. Pain 10/10. 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 Per EMS ASA 324 and 1 nitro given with small reduction in pain. No LOC, N/V/D or SOB at this time.

## 2017-12-24 NOTE — Progress Notes (Signed)
PHARMACIST - PHYSICIAN ORDER COMMUNICATION  CONCERNING: P&T Medication Policy on Herbal Medications  DESCRIPTION:  This patient's order for:  biotin  has been noted.  This product(s) is classified as an "herbal" or natural product. Due to a lack of definitive safety studies or FDA approval, nonstandard manufacturing practices, plus the potential risk of unknown drug-drug interactions while on inpatient medications, the Pharmacy and Therapeutics Committee does not permit the use of "herbal" or natural products of this type within [facility].   ACTION TAKEN: The pharmacy department is unable to verify this order at this time and your patient has been informed of this safety policy. Please reevaluate patient's clinical condition at discharge and address if the herbal or natural product(s) should be resumed at that time.  Jessicia Napolitano A. Levada Dy, PharmD, [place] Pager: [phone]

## 2017-12-24 NOTE — Consult Note (Signed)
Reason for the consult: Leukocytosis, suspect acute leukemia.  HPI: 84 year old [name] currently of [place] where she lived the majority of her life.  She has a history of coronary artery disease as well as breast cancer that was diagnosed in in 2013.  At that time she was found to have invasive ductal carcinoma measuring 1.1 cm of the left breast.  Her tumor was ER, PR positive and HER-2 negative.  She underwent lumpectomy and sentinel lymph node sampling followed by adjuvant radiation therapy.  She completed 5 years of anastrozole under the care of Dr. Jana Hakim in 2018.  In December 2018 she presented with abdominal pain and reevaluation included a CBC which showed white cell count of 7.0 hemoglobin of 14.4 with a platelet count of 245.  CT scan of the abdomen pelvis showed nephrolithiasis which contribute to her pain.    She has felt reasonably well until about last month or so and started developing worsening arthralgias and myalgias.  She reported the pain across her shoulders, back and neck.  She was brought into the emergency department by her family because of these complaints.  She had a CBC done on 12/24/2017 which showed a white cell count of 68,000, hemoglobin of 11.1 and a platelet count of 66.  Her peripheral smear showed increase peripheral blast close to 40% by review.  She also had metamyelocytes and promyelocytes.  I was asked to comment about these findings that are concerning for acute hematological malignancy.  Upon my interview today, she reports no other complaints.  She denies any fevers chills or weight loss.  Her appetite has been reasonable.  Although she have felt worse in the last 2 weeks she has not felt "right'for a longer period of time.  She denies any abdominal discomfort or early satiety.  She denies any easy bruising or bleeding.  She does not report any headaches, blurry vision, syncope or seizures. Does not report any fevers, chills or sweats.  Does not report any  cough, wheezing or hemoptysis.  Does not report any chest pain, palpitation, orthopnea or leg edema.  Does not report any nausea, vomiting or abdominal pain.  Does not report any constipation or diarrhea.     Does not report frequency, urgency or hematuria.  Does not report any skin rashes or lesions. Does not report any heat or cold intolerance.  Does not report any lymphadenopathy or petechiae.  Does not report any anxiety or depression.  Remaining review of systems is negative.    Past Medical History:  Diagnosis Date  . Arthritis   . Breast cancer ([place]) 10/20/11   L breast, ER/PR+, HER2 -  . Breast cancer, left breast ([place]) 11/01/2011  . Bronchitis    hx of  . CAD (coronary artery disease)    LHC 06/14/11: LAD 80% and 80% after Dx, Dx 80% (Dx was small), oOM 80%, mOM 70% (OM was small), oRCA 70-80%, mRCA 60-70%, EF 55% - referred for CABG;   Echo 06/14/11: mild focal basal septal hypertrophy, grade 1 diast dysfxn, mild LAE, mild RVE, PASP 37.    . Carotid stenosis    dopplers 99/35: LICA 70-17%  . Diabetes mellitus    x 5-6 yrs  . Diverticulitis of colon with perforation   . DM2 (diabetes mellitus, type 2) ([place])   . GERD (gastroesophageal reflux disease)   . History of kidney stones   . HLD (hyperlipidemia)   . HTN (hypertension)   . Hypothyroidism   . Kidney stones   .  Personal history of radiation therapy   . Psoriasis   . RLS (restless legs syndrome)   . S/P CABG x 2 06/2011   Dr. Servando Snare (L[facility]LAD, [facility])  . S/P radiation therapy 12/20/11 - 01/10/12   Left Breast: 4256 cGy/ 16 Fractions  :  Past Surgical History:  Procedure Laterality Date  . ABDOMINAL HYSTERECTOMY     age 10's  . APPENDECTOMY    . arthroscopy right knee    . BREAST BIOPSY Left 11/04/2011   Benign  . BREAST BIOPSY Left 10/20/2011   Malignant  . breast biospy  11/04/11   Breast, Left, Needle Core Biopsy 6 Oclock, Benign Breast Tissue wtih Fibrocystic  Changes: Microcalcifications Identified, No Neoplasm or  Malignancy Identified  . breast biospy  10/20/11   Breast, Left, Needle Core Biopsy LOQ: Invsive ductal Carcinoman  . BREAST LUMPECTOMY Left 11/22/11   Breast, Lumpectomy, Left with Sentinel Node Biopsy: Invasive High Grade Ductal Carcinoma : No lymphovascular Invasion: High  Grade Ductal Carcinoma In-Situ with Calcification and [place]edo Necrosis  . CARDIAC CATHETERIZATION  2012  . COLON RESECTION  1977  . COLON SURGERY     part colectomy-  . COLONOSCOPY    . CORONARY ARTERY BYPASS GRAFT  10/12  . Off-pump coronary artery bypass grafting x2  06/16/2011  . SHOULDER ARTHROSCOPY WITH SUBACROMIAL DECOMPRESSION, ROTATOR CUFF REPAIR AND BICEP TENDON REPAIR Right 10/16/2014   Procedure: RIGHT SHOULDER ARTHROSCOPY WITH SUBACROMIAL DECOMPRESSION, DISTAL CLAVICLE RESECTION AND ROTATOR CUFF REPAIR ;  Surgeon: Marin Shutter, MD;  Location: [facility];  Service: Orthopedics;  Laterality: Right;  . VEIN BYPASS SURGERY  1999  :   Current Facility-Administered Medications:  .  iopamidol (ISOVUE-370) 76 % injection, , , ,   Current Outpatient Medications:  .  acetaminophen (TYLENOL) 500 MG tablet, Take 500 mg by mouth every 6 (six) hours as needed for moderate pain., Disp: , Rfl:  .  aspirin 81 MG tablet, Take 81 mg by mouth every evening. , Disp: , Rfl:  .  benzonatate (TESSALON) 200 MG capsule, Take 1 capsule (200 mg total) by mouth every 8 (eight) hours. (Patient taking differently: Take 200 mg by mouth 2 (two) times daily as needed. ), Disp: 28 capsule, Rfl: 0 .  BIOTIN FORTE PO, Take 1 tablet by mouth daily., Disp: , Rfl:  .  calcium carbonate (OS-CAL - DOSED IN MG OF ELEMENTAL CALCIUM) 1250 (500 Ca) MG tablet, Take 1 tablet by mouth at bedtime., Disp: , Rfl:  .  canagliflozin (INVOKANA) 300 MG TABS tablet, Take 300 mg by mouth daily before breakfast., Disp: , Rfl:  .  Cholecalciferol (VITAMIN D-3) 5000 UNITS TABS, Take 1 tablet by mouth daily., Disp: , Rfl:  .  CRESTOR 20 MG tablet, Take 20 mg by mouth  every evening. , Disp: , Rfl:  .  diazepam (VALIUM) 5 MG tablet, Take 0.5-1 tablets (2.5-5 mg total) by mouth every 6 (six) hours as needed for muscle spasms or sedation. (Patient taking differently: Take 5 mg by mouth every 6 (six) hours as needed for muscle spasms or sedation. ), Disp: 40 tablet, Rfl: 1 .  esomeprazole (NEXIUM) 40 MG capsule, Take 40 mg by mouth daily before breakfast.  , Disp: , Rfl:  .  ezetimibe (ZETIA) 10 MG tablet, Take 10 mg by mouth every evening. , Disp: , Rfl:  .  furosemide (LASIX) 40 MG tablet, TAKE 1 TABLET DAILY, Disp: 90 tablet, Rfl: 3 .  gabapentin (NEURONTIN) 100 MG capsule, TAKE  1 CAPSULE AT BEDTIME  AS NEEDED, Disp: 90 capsule, Rfl: 3 .  ibuprofen (ADVIL,MOTRIN) 200 MG tablet, Take 400 mg by mouth every 6 (six) hours as needed for moderate pain (shoulder pain)., Disp: , Rfl:  .  levothyroxine (SYNTHROID, LEVOTHROID) 137 MCG tablet, Take 137 mcg by mouth daily before breakfast., Disp: , Rfl:  .  losartan (COZAAR) 50 MG tablet, Take 1 tablet (50 mg total) by mouth 2 (two) times daily., Disp: 180 tablet, Rfl: 3 .  metoprolol (LOPRESSOR) 50 MG tablet, Take 1.5 tablets (75 mg total) by mouth 2 (two) times daily. Please keep 07/01/16 appt for further refills, Disp: 270 tablet, Rfl: 0 .  Multiple Vitamin (MULTIVITAMIN) capsule, Take 1 capsule by mouth every evening. , Disp: , Rfl:  .  Multiple Vitamins-Minerals (EYE VITAMINS) CAPS, Take 1 capsule by mouth 2 (two) times daily. , Disp: , Rfl:  .  mupirocin ointment (BACTROBAN) 2 %, Apply 1 application topically 2 (two) times daily. (Patient taking differently: Apply 1 application topically as needed. ), Disp: 22 g, Rfl: 0 .  pioglitazone (ACTOS) 15 MG tablet, Take 15 mg by mouth daily., Disp: , Rfl:  .  potassium chloride (KLOR-CON M10) 10 MEQ tablet, Take 1 tablet (10 mEq total) by mouth daily., Disp: 90 tablet, Rfl: 3 .  rOPINIRole (REQUIP) 3 MG tablet, Take 3 mg by mouth at bedtime. , Disp: , Rfl:  .  fluticasone  (FLONASE) 50 MCG/ACT nasal spray, Place 1 spray into both nostrils daily for 7 days., Disp: 1 g, Rfl: 0 .  JARDIANCE 25 MG TABS tablet, Take 25 mg by mouth daily., Disp: , Rfl: :  No Known Allergies:  Family History  Problem Relation Age of Onset  . Cancer Mother        throat  . Heart disease Mother   . Heart disease Father   . Hypertension Brother   . Cancer Paternal Uncle   :  Social History   Socioeconomic History  . Marital status: Married    Spouse name: Not on file  . Number of children: 1  . Years of education: HS  . Highest education level: Not on file  Occupational History  . 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 Financial resource strain: Not on file  . Food insecurity:    Worry: Not on file    Inability: Not on file  . Transportation needs:    Medical: Not on file    Non-medical: Not on file  Tobacco Use  . Smoking status: Former Smoker    Packs/day: 1.00    Years: 6.00    Pack years: 6.00    Types: Cigarettes    Last attempt to quit: 09/05/1980    Years since quitting: 37.3  . Smokeless tobacco: Never Used  Substance and Sexual Activity  . Alcohol use: Yes    Comment: socially  . Drug use: No  . Sexual activity: Not on file    Comment: menarche 61, p2, 1 stillbirth, 1st preg age 19, HRT x many yrs  Lifestyle  . Physical activity:    Days per week: Not on file    Minutes per session: Not on file  . Stress: Not on file  Relationships  . Social connections:    Talks on phone: Not on file    Gets together: Not on file    Attends religious service: Not on file    Active member of club  or organization: Not on file    Attends meetings of clubs or organizations: Not on file    Relationship status: Not on file  . Intimate partner violence:    Fear of current or ex partner: Not on file    Emotionally abused: Not on file    Physically abused: Not on file    Forced sexual activity: Not on file  Other  Topics Concern  . Not on file  Social History Narrative   Denies caffeine use   :  Pertinent items are noted in HPI.  Exam: Blood pressure (!) 122/52, pulse 69, temperature 99 F (37.2 C), temperature source Oral, resp. rate 17, height 5' 3"  (1.6 m), weight 195 lb (88.5 kg), SpO2 99 %.  ECOG 1 General appearance: alert and cooperative appeared without distress. Head: atraumatic without any abnormalities. Eyes: conjunctivae/corneas clear. PERRL.  Sclera anicteric. Throat: lips, mucosa, and tongue normal; without oral thrush or ulcers. Resp: clear to auscultation bilaterally without rhonchi, wheezes or dullness to percussion. Cardio: regular rate and rhythm, S1, S2 normal, no murmur, click, rub or gallop GI: soft, non-tender; bowel sounds normal; no masses,  no splenomegaly palpated. Skin: Skin color, texture, turgor normal. No rashes or lesions Lymph nodes: Cervical, supraclavicular, and axillary nodes normal. Neurologic: Grossly normal without any motor, sensory or deep tendon reflexes Musculoskeletal: No joint deformity or effusion.  Recent Labs    12/24/17 1310  WBC 68.1*  HGB 11.1*  HCT 33.9*  PLT 66*   Recent Labs    12/24/17 1310  NA 136  K 4.2  CL 103  CO2 20*  GLUCOSE 136*  BUN 28*  CREATININE 1.25*  CALCIUM 9.1     Blood smear review: Peripheral smear was personally reviewed today and showed increased peripheral blasts.  No red cell fragments or schistocytes.    Assessment and Plan:   84 year old [name] with the following issues:  1.  Leukocytosis with peripheral blasts: She presented on December 24, 2017 with a white cell count of 16,000 and symptoms of arthralgias and myalgias.  Her peripheral smear showed increased blast count close to 47% which raises is suspicious for acute leukemia.  The differential diagnosis was discussed today with the patient and her family which include acute myelogenous leukemia, acute lymphoblastic leukemia and less likely  chronic myelogenous leukemia.  She had normal white cell count in December 2018 which indicates likely acute process.  The ramification of this diagnosis was discussed today with with her and her family.  Despite her reasonable health acute leukemia in an elderly [name] carries a poor prognosis and overall rather difficult to treat condition.     In order to confirm the diagnosis however she will require a bone marrow biopsy.  Complication associated with this procedure including pain, bleeding and infection were reviewed in addition to mild pain.  We will arrange for bone marrow biopsy to obtained on 12/25/2017 and will currently ask interventional radiology to do that soon as possible.  Once a diagnosis is made, her treatment options will be discussed which include potential transfer to tertiary care center depending on her willingness to undergo aggressive therapy.  I will hold off on starting hydroxyurea diagnosis is confirmed.  Risk of symptomatic hyperleukocytosis is low at this time.  She is well-known to Dr. Jana Hakim and she requested his involvement and I will asked him to kindly direct her care moving forward.  2.  Tumor lysis syndrome prophylaxis: I recommend gentle hydration overnight and monitor her kidney  function and electrolytes closely.  She would benefit from allopurinol for prophylaxis purposes.  3.  Infectious prophylaxis: No indication to start antibiotics at this time but would monitor for signs and symptoms of sepsis.  I will have a low threshold of starting intravenous antibiotics he develops any fever.  4.  Bleeding prophylaxis: I recommend obtaining DIC panel for screening purposes.  She has no signs or symptoms of bleeding or bruising at this time but certainly at risk of developing it.  5.  Back and shoulder pain: Most likely related to acute leukemia.  Aortic dissection study is currently in process to rule out this etiology.  6.  Disposition: I recommend a  hospitalization for urgent evaluation of acute leukemia and obtaining expedited bone marrow biopsy. Hospitalization at [facility] hospital is preferable.  I addressed all her questions and her family questions the best of my ability at this time.  80  minutes was spent with the patient face-to-face today.  More than 50% of time was dedicated to patient counseling, education and coordination of her multifaceted care.

## 2017-12-24 NOTE — H&P (Signed)
History and Physical  [name] UQJ:[id number] DOB: Dec 24, 1932 DOA: 12/24/2017   PCP: Katherine Skeens, [name]   Patient coming from:HOME & is able to ambulate   Chief Complaint: Bilateral shoulder pain for 1 day and feeling unwell couple of weeks  [name]:HTDSKAJ N Hoobler is a 84 y.o. female with history of CAD s/p CABG in 2012, diabetes, CHF, carotid stenosis, hypertension, hyperlipidemia as well as breast cancer that was diagnosed in in 2013.  At that time she was found to have invasive ductal carcinoma measuring 1.1 cm of the left breast.  Her tumor was ER, PR positive and HER-2 negative.  She underwent lumpectomy and sentinel lymph node sampling followed by adjuvant radiation therapy in 2013 she had a mammogram that was normal a week ago.  She completed 5 years of anastrozole under the care of [name] in 2018.   who presents with bilateral shoulder and upper back pain that began around 3 AM this morning.  Patient stated that she took Advil but that did not help the pain is a constant pain. Patient denies any significant chest pain syncope or seizures.  She denies any shortness of breath, nausea, vomiting, denies blurring of vision is bruising or bleeding abdominal discomfort fever or chills or loss of appetite denies weight loss night sweats cough or l diaphoresis.  She describes her pain as a throbbing sensation with her heartbeat.  She describes it like a toothache.  Nothing makes her pain better or worse.  She was given aspirin and nitroglycerin in route.  She does not note any significant improvement of pain even with the Advil.  She rates her pain as a 10/10.  She reports she also had bilateral groin pain when she was walking, but she states that it may have been anxiety.  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  She denies any recent long trips, surgeries, she does have chronic lower  extremity edema for which she uses Lasix as well as restless leg syndrome.  ED Course: Unremarkable    Review of Systems  Constitutional: Negative for chills, diaphoresis and fever.  HENT: Negative for facial swelling and sore throat.   Respiratory: Negative for shortness of breath.   Cardiovascular: Negative for chest pain.  Gastrointestinal: Negative for abdominal pain, nausea and vomiting.  Genitourinary: Negative for dysuria.  Musculoskeletal: Positive for arthralgias and back pain.  Skin: Negative for rash and wound.  Neurological: Negative for headaches.  Psychiatric/Behavioral: The patient is not nervous/anxious.        Past Medical History:  Diagnosis Date  . Arthritis   . Breast cancer ([place]) 10/20/11   L breast, ER/PR+, HER2 -  . Breast cancer, left breast ([place]) 11/01/2011  . Bronchitis    hx of  . CAD (coronary artery disease)    LHC 06/14/11: LAD 80% and 80% after Dx, Dx 80% (Dx was small), oOM 80%, mOM 70% (OM was small), oRCA 70-80%, mRCA 60-70%, EF 55% - referred for CABG;   Echo 06/14/11: mild focal basal septal hypertrophy, grade 1 diast dysfxn, mild LAE, mild RVE, PASP 37.    . Carotid stenosis    dopplers 68/11: LICA 57-26%  . Diabetes mellitus    x 5-6 yrs  . Diverticulitis of colon with perforation   . DM2 (diabetes mellitus, type 2) ([place])   . GERD (gastroesophageal reflux disease)   . History of kidney  stones   . HLD (hyperlipidemia)   . HTN (hypertension)   . Hypothyroidism   . Kidney stones   . Personal history of radiation therapy   . Psoriasis   . RLS (restless legs syndrome)   . S/P CABG x 2 06/2011   [name] (L-LAD, [facility])  . S/P radiation therapy 12/20/11 - 01/10/12   Left Breast: 4256 cGy/ 16 Fractions   Past Surgical History:  Procedure Laterality Date  . ABDOMINAL HYSTERECTOMY     age 29's  . APPENDECTOMY    . arthroscopy right knee    . BREAST BIOPSY Left 11/04/2011   Benign  . BREAST BIOPSY Left 10/20/2011   Malignant  .  breast biospy  11/04/11   Breast, Left, Needle Core Biopsy 6 Oclock, Benign Breast Tissue wtih Fibrocystic  Changes: Microcalcifications Identified, No Neoplasm or Malignancy Identified  . breast biospy  10/20/11   Breast, Left, Needle Core Biopsy LOQ: Invsive ductal Carcinoman  . BREAST LUMPECTOMY Left 11/22/11   Breast, Lumpectomy, Left with Sentinel Node Biopsy: Invasive High Grade Ductal Carcinoma : No lymphovascular Invasion: High  Grade Ductal Carcinoma In-Situ with Calcification and Comedo Necrosis  . CARDIAC CATHETERIZATION  2012  . COLON RESECTION  1977  . COLON SURGERY     part colectomy-  . COLONOSCOPY    . CORONARY ARTERY BYPASS GRAFT  10/12  . Off-pump coronary artery bypass grafting x2  06/16/2011  . SHOULDER ARTHROSCOPY WITH SUBACROMIAL DECOMPRESSION, ROTATOR CUFF REPAIR AND BICEP TENDON REPAIR Right 10/16/2014   Procedure: RIGHT SHOULDER ARTHROSCOPY WITH SUBACROMIAL DECOMPRESSION, DISTAL CLAVICLE RESECTION AND ROTATOR CUFF REPAIR ;  Surgeon: Katherine Shutter, [name];  Location: [facility];  Service: Orthopedics;  Laterality: Right;  . VEIN BYPASS SURGERY  1999    Social History:  reports that she quit smoking about 37 years ago. Her smoking use included cigarettes. She has a 6.00 pack-year smoking history. She has never used smokeless tobacco. She reports that she drinks alcohol. She reports that she does not use drugs.   No Known Allergies  Family History  Problem Relation Age of Onset  . Cancer Mother        throat  . Heart disease Mother   . Heart disease Father   . Hypertension Brother   . Cancer Paternal Uncle       Prior to Admission medications   Medication Sig Start Date End Date Taking? Authorizing Provider  acetaminophen (TYLENOL) 500 MG tablet Take 500 mg by mouth every 6 (six) hours as needed for moderate pain.   Yes Provider, Historical, [name]  aspirin 81 MG tablet Take 81 mg by mouth every evening.    Yes Provider, Historical, [name]  benzonatate (TESSALON) 200 MG capsule  Take 1 capsule (200 mg total) by mouth every 8 (eight) hours. Patient taking differently: Take 200 mg by mouth 2 (two) times daily as needed.  12/18/17  Yes [name], [name] C, PA-C  BIOTIN FORTE PO Take 1 tablet by mouth daily.   Yes Provider, Historical, [name]  calcium carbonate (OS-CAL - DOSED IN MG OF ELEMENTAL CALCIUM) 1250 (500 Ca) MG tablet Take 1 tablet by mouth at bedtime.   Yes Provider, Historical, [name]  canagliflozin (INVOKANA) 300 MG TABS tablet Take 300 mg by mouth daily before breakfast.   Yes Provider, Historical, [name]  Cholecalciferol (VITAMIN D-3) 5000 UNITS TABS Take 1 tablet by mouth daily.   Yes Provider, Historical, [name]  CRESTOR 20 MG tablet Take 20 mg by mouth every evening.  11/10/11  Yes Provider, Historical, [name]  diazepam (VALIUM) 5 MG tablet Take 0.5-1 tablets (2.5-5 mg total) by mouth every 6 (six) hours as needed for muscle spasms or sedation. Patient taking differently: Take 5 mg by mouth every 6 (six) hours as needed for muscle spasms or sedation.  10/16/14  Yes Shuford, Olivia Mackie, PA-C  esomeprazole (NEXIUM) 40 MG capsule Take 40 mg by mouth daily before breakfast.     Yes Provider, Historical, [name]  ezetimibe (ZETIA) 10 MG tablet Take 10 mg by mouth every evening.    Yes Provider, Historical, [name]  furosemide (LASIX) 40 MG tablet TAKE 1 TABLET DAILY 11/06/17  Yes Weaver, Scott T, PA-C  gabapentin (NEURONTIN) 100 MG capsule TAKE 1 CAPSULE AT BEDTIME  AS NEEDED 08/18/17  Yes Star Age, [name]  ibuprofen (ADVIL,MOTRIN) 200 MG tablet Take 400 mg by mouth every 6 (six) hours as needed for moderate pain (shoulder pain).   Yes Provider, Historical, [name]  levothyroxine (SYNTHROID, LEVOTHROID) 137 MCG tablet Take 137 mcg by mouth daily before breakfast.   Yes Provider, Historical, [name]  losartan (COZAAR) 50 MG tablet Take 1 tablet (50 mg total) by mouth 2 (two) times daily. 10/11/17  Yes Burnell Blanks, [name]  metoprolol (LOPRESSOR) 50 MG tablet Take 1.5 tablets (75 mg total) by mouth 2 (two) times  daily. Please keep 07/01/16 appt for further refills 05/10/16  Yes Burnell Blanks, [name]  Multiple Vitamin (MULTIVITAMIN) capsule Take 1 capsule by mouth every evening.    Yes Provider, Historical, [name]  Multiple Vitamins-Minerals (EYE VITAMINS) CAPS Take 1 capsule by mouth 2 (two) times daily.    Yes Provider, Historical, [name]  mupirocin ointment (BACTROBAN) 2 % Apply 1 application topically 2 (two) times daily. Patient taking differently: Apply 1 application topically as needed.  09/26/16  Yes Trula Slade, DPM  pioglitazone (ACTOS) 15 MG tablet Take 15 mg by mouth daily.   Yes Provider, Historical, [name]  potassium chloride (KLOR-CON M10) 10 MEQ tablet Take 1 tablet (10 mEq total) by mouth daily. 11/06/17  Yes Burnell Blanks, [name]  rOPINIRole (REQUIP) 3 MG tablet Take 3 mg by mouth at bedtime.    Yes Provider, Historical, [name]  fluticasone (FLONASE) 50 MCG/ACT nasal spray Place 1 spray into both nostrils daily for 7 days. 12/18/17 12/25/17  [name], [name] C, PA-C  JARDIANCE 25 MG TABS tablet Take 25 mg by mouth daily. 10/25/17   Provider, Historical, [name]    Physical Exam: BP (!) 122/52   Pulse 69   Temp 99 F (37.2 C) (Oral)   Resp 17   Ht 5' 3"  (1.6 m)   Wt 88.5 kg (195 lb)   SpO2 99%   BMI 34.54 kg/m    Physical Exam  Constitutional: She appears well-developed and well-nourished. No distress.  HENT:  Head: Normocephalic and atraumatic.  Mouth/Throat: Oropharynx is clear and moist. No oropharyngeal exudate.  Eyes: Pupils are equal, round, and reactive to light. Conjunctivae are normal. Right eye exhibits no discharge. Left eye exhibits no discharge. No scleral icterus.  Neck: Normal range of motion. Neck supple. No thyromegaly present.  Cardiovascular: Normal rate, regular rhythm, normal heart sounds and intact distal pulses. Exam reveals no gallop and no friction rub.  No murmur heard. Pulmonary/Chest: Effort normal and breath sounds normal. No stridor. No respiratory  distress. She has no wheezes. She has no rales. She exhibits no tenderness.  Abdominal: Soft. Bowel sounds are normal. She exhibits no distension. There is no tenderness. There  is no rebound and no guarding.  Musculoskeletal: She exhibits +1   edema around the ankle.  No tenderness on palpation of the bilateral shoulders or upper back  Lymphadenopathy:    She has no cervical adenopathy.  Neurological: She is alert. Coordination normal.  Skin: Skin is warm and dry. No rash noted. She is not diaphoretic. No pallor.  Psychiatric: She has a normal mood and affect.            Labs on Admission:  Basic Metabolic Panel: Recent Labs  Lab 12/24/17 1310  NA 136  K 4.2  CL 103  CO2 20*  GLUCOSE 136*  BUN 28*  CREATININE 1.25*  CALCIUM 9.1   Liver Function Tests: Recent Labs  Lab 12/24/17 1310  AST 21  ALT 15  ALKPHOS 81  BILITOT 0.8  PROT 6.4*  ALBUMIN 3.2*   No results for input(s): LIPASE, AMYLASE in the last 168 hours. No results for input(s): AMMONIA in the last 168 hours. CBC: Recent Labs  Lab 12/24/17 1310  WBC 68.1*  NEUTROABS 12.9*  HGB 11.1*  HCT 33.9*  MCV 95.0  PLT 66*   Cardiac Enzymes: No results for input(s): CKTOTAL, CKMB, CKMBINDEX, TROPONINI in the last 168 hours.  BNP (last 3 results) Recent Labs    12/24/17 1313  BNP 184.2*    ProBNP (last 3 results) No results for input(s): PROBNP in the last 8760 hours.  CBG: No results for input(s): GLUCAP in the last 168 hours.  Radiological Exams on Admission: Dg Chest 2 View  Result Date: 12/24/2017 CLINICAL DATA:  Onset of chest pain this morning. EXAM: CHEST - 2 VIEW COMPARISON:  PA and lateral chest 12/18/2017 and 05/13/2015. FINDINGS: Lungs are clear. Heart size is upper normal. No pneumothorax or pleural effusion. The patient is status post CABG. Thoracic spondylosis noted. IMPRESSION: No acute disease. Electronically Signed   By: Inge Rise M.D.   On: 12/24/2017 14:48    EKG:    Assessment/Plan Present on Admission: . Leukemia ([place])  Patient with sudden onset bilateral shoulder pain and upper back pain. CBC shows new significant leukocytosis, WBC 68.1, as well as platelets 66,000.  I consulted hematologist/oncologist, Dr. Alen Blew who evaluated the patient and advised, admission to medicine at [facility] for workup of leukemia. We will also conduct CT angio chest abdomen pelvis to rule out dissection, however leukemia much more likely cause of symptoms.  Chest x-ray is negative.  EKG shows NSR and is unchanged from past.  Stable vitals. Troponin is negative.  BNP mildly elevated, 184.2.  CMP shows BUN 28, creatinine 1.25, protein 6.4, albumin 3.2.   Patient will be admitted to the medical surgical floor  Type 2 diabetes mellitus patient is well controlled continue her current home medication  Restless leg syndrome we will continue her record patient states he is not well controlled she still stays up late at night because of the restless leg.      Active Problems:   Leukemia (H[facility])   DVT prophylaxis: TED hose  Code Status: Full  Family Communication: Spoke with husband and son who were in the room  Disposition Plan: Inpatient admission for possible bone biopsy ED physician has already contacted Dr. Alen Blew  the oncologist who advised admission to [facility] called: Oncology  Admission status: Inpatient to [name] Pager [phone]  If 7PM-7AM, please contact night-coverage [email or web address] Password [facility]  12/24/2017, 5:19 PM

## 2017-12-24 NOTE — ED Notes (Signed)
Pathologist smear added to blood in main lab.

## 2017-12-24 NOTE — ED Notes (Signed)
Patient transported to CT 

## 2017-12-24 NOTE — ED Provider Notes (Signed)
[facility] EMERGENCY DEPARTMENT Provider Note   CSN: [id number] Arrival date & time: 12/24/17  1247     History   Chief Complaint Chief Complaint  Patient presents with  . Chest Pain    HPI CHEYNE BUNGERT is a 84 y.o. female with history of CAD s/p CABG in 2012, diabetes, CHF, carotid stenosis, hypertension, hyperlipidemia who presents with bilateral shoulder and upper back pain that began around 5 or 6 AM this morning.  Patient denies any significant chest pain.  She denies any shortness of breath, nausea, vomiting, diaphoresis.  She describes her pain as a throbbing sensation with her heartbeat.  She describes it like a toothache.  Nothing makes her pain better or worse.  She was given aspirin and nitroglycerin in route.  She does not note any significant improvement of pain.  She rates her pain as a 10/10.  She reports she also had bilateral groin pain when she was walking, but she states that it may have been anxiety.  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  She denies any recent long trips, surgeries, recent cancer (breast cancer in remission 2012), new leg pain or swelling, exogenous estrogen use, or history of blood clots.  Patient was recently sick with an upper respiratory infection was seen at urgent care 12/18/2017.  At that time she had a pleuritic rib pain that she described as a catch, which is now resolved.  She describes her angina symptoms as a chest pressure and this feels much different.  HPI  Past Medical History:  Diagnosis Date  . Arthritis   . Breast cancer ([place]) 10/20/11   L breast, ER/PR+, HER2 -  . Breast cancer, left breast ([place]) 11/01/2011  . Bronchitis    hx of  . CAD (coronary artery disease)    LHC 06/14/11: LAD 80% and 80% after Dx, Dx 80% (Dx was small), oOM 80%, mOM 70% (OM was small), oRCA 70-80%, mRCA 60-70%, EF 55% - referred for  CABG;   Echo 06/14/11: mild focal basal septal hypertrophy, grade 1 diast dysfxn, mild LAE, mild RVE, PASP 37.    . Carotid stenosis    dopplers 09/98: LICA 33-82%  . Diabetes mellitus    x 5-6 yrs  . Diverticulitis of colon with perforation   . DM2 (diabetes mellitus, type 2) ([place])   . GERD (gastroesophageal reflux disease)   . History of kidney stones   . HLD (hyperlipidemia)   . HTN (hypertension)   . Hypothyroidism   . Kidney stones   . Personal history of radiation therapy   . Psoriasis   . RLS (restless legs syndrome)   . S/P CABG x 2 06/2011   Dr. Servando Snare (L[facility]LAD, [facility])  . S/P radiation therapy 12/20/11 - 01/10/12   Left Breast: 4256 cGy/ 16 Fractions    Patient Active Problem List   Diagnosis Date Noted  . Bronchitis 05/13/2015  . Vaginal dryness 04/21/2014  . Osteoporosis 03/26/2012  . Malignant neoplasm of lower-outer quadrant of left breast of female, estrogen receptor positive ([place]) 11/28/2011  . Diabetes mellitus   . Breast cancer, left breast ([place]) 11/01/2011  . Breast cancer ([place]) 10/20/2011  . Edema 07/05/2011  . CAD (coronary artery disease)   . HTN (hypertension)   . HLD (hyperlipidemia)   . Carotid stenosis   . Kidney stones   . Pregnancy toxemia   .  Diverticulitis of colon with perforation   . HYPERTENSION, BENIGN 06/04/2009  . LBBB 06/04/2009    Past Surgical History:  Procedure Laterality Date  . ABDOMINAL HYSTERECTOMY     age 71's  . APPENDECTOMY    . arthroscopy right knee    . BREAST BIOPSY Left 11/04/2011   Benign  . BREAST BIOPSY Left 10/20/2011   Malignant  . breast biospy  11/04/11   Breast, Left, Needle Core Biopsy 6 Oclock, Benign Breast Tissue wtih Fibrocystic  Changes: Microcalcifications Identified, No Neoplasm or Malignancy Identified  . breast biospy  10/20/11   Breast, Left, Needle Core Biopsy LOQ: Invsive ductal Carcinoman  . BREAST LUMPECTOMY Left 11/22/11   Breast, Lumpectomy, Left with Sentinel Node Biopsy: Invasive High  Grade Ductal Carcinoma : No lymphovascular Invasion: High  Grade Ductal Carcinoma In-Situ with Calcification and [place]edo Necrosis  . CARDIAC CATHETERIZATION  2012  . COLON RESECTION  1977  . COLON SURGERY     part colectomy-  . COLONOSCOPY    . CORONARY ARTERY BYPASS GRAFT  10/12  . Off-pump coronary artery bypass grafting x2  06/16/2011  . SHOULDER ARTHROSCOPY WITH SUBACROMIAL DECOMPRESSION, ROTATOR CUFF REPAIR AND BICEP TENDON REPAIR Right 10/16/2014   Procedure: RIGHT SHOULDER ARTHROSCOPY WITH SUBACROMIAL DECOMPRESSION, DISTAL CLAVICLE RESECTION AND ROTATOR CUFF REPAIR ;  Surgeon: Marin Shutter, MD;  Location: [facility];  Service: Orthopedics;  Laterality: Right;  . VEIN BYPASS SURGERY  1999     OB History   None      Home Medications    Prior to Admission medications   Medication Sig Start Date End Date Taking? Authorizing Provider  acetaminophen (TYLENOL) 500 MG tablet Take 500 mg by mouth every 6 (six) hours as needed for moderate pain.   Yes [provider]  aspirin 81 MG tablet Take 81 mg by mouth every evening.    Yes [provider]  benzonatate (TESSALON) 200 MG capsule Take 1 capsule (200 mg total) by mouth every 8 (eight) hours. Patient taking differently: Take 200 mg by mouth 2 (two) times daily as needed.  12/18/17  Yes Wieters, Hallie C, PA-C  BIOTIN FORTE PO Take 1 tablet by mouth daily.   Yes [provider]  calcium carbonate (OS-CAL - DOSED IN MG OF ELEMENTAL CALCIUM) 1250 (500 Ca) MG tablet Take 1 tablet by mouth at bedtime.   Yes [provider]  canagliflozin (INVOKANA) 300 MG TABS tablet Take 300 mg by mouth daily before breakfast.   Yes [provider]  Cholecalciferol (VITAMIN D-3) 5000 UNITS TABS Take 1 tablet by mouth daily.   Yes [provider]  CRESTOR 20 MG tablet Take 20 mg by mouth every evening.  11/10/11  Yes [provider]  diazepam (VALIUM) 5 MG tablet Take 0.5-1 tablets (2.5-5 mg total) by  mouth every 6 (six) hours as needed for muscle spasms or sedation. Patient taking differently: Take 5 mg by mouth every 6 (six) hours as needed for muscle spasms or sedation.  10/16/14  Yes Shuford, Olivia Mackie, PA-C  esomeprazole (NEXIUM) 40 MG capsule Take 40 mg by mouth daily before breakfast.     Yes [provider]  ezetimibe (ZETIA) 10 MG tablet Take 10 mg by mouth every evening.    Yes [provider]  furosemide (LASIX) 40 MG tablet TAKE 1 TABLET DAILY 11/06/17  Yes Weaver, Scott T, PA-C  gabapentin (NEURONTIN) 100 MG capsule TAKE 1 CAPSULE AT BEDTIME  AS NEEDED 08/18/17  Yes Athar,  Eunice Blase, MD  ibuprofen (ADVIL,MOTRIN) 200 MG tablet Take 400 mg by mouth every 6 (six) hours as needed for moderate pain (shoulder pain).   Yes [provider]  levothyroxine (SYNTHROID, LEVOTHROID) 137 MCG tablet Take 137 mcg by mouth daily before breakfast.   Yes [provider]  losartan (COZAAR) 50 MG tablet Take 1 tablet (50 mg total) by mouth 2 (two) times daily. 10/11/17  Yes Burnell Blanks, MD  metoprolol (LOPRESSOR) 50 MG tablet Take 1.5 tablets (75 mg total) by mouth 2 (two) times daily. Please keep 07/01/16 appt for further refills 05/10/16  Yes Burnell Blanks, MD  Multiple Vitamin (MULTIVITAMIN) capsule Take 1 capsule by mouth every evening.    Yes [provider]  Multiple Vitamins-Minerals (EYE VITAMINS) CAPS Take 1 capsule by mouth 2 (two) times daily.    Yes [provider]  mupirocin ointment (BACTROBAN) 2 % Apply 1 application topically 2 (two) times daily. Patient taking differently: Apply 1 application topically as needed.  09/26/16  Yes Trula Slade, DPM  pioglitazone (ACTOS) 15 MG tablet Take 15 mg by mouth daily.   Yes [provider]  potassium chloride (KLOR-CON M10) 10 MEQ tablet Take 1 tablet (10 mEq total) by mouth daily. 11/06/17  Yes Burnell Blanks, MD  rOPINIRole (REQUIP) 3 MG tablet Take 3 mg by mouth at  bedtime.    Yes [provider]  fluticasone (FLONASE) 50 MCG/ACT nasal spray Place 1 spray into both nostrils daily for 7 days. 12/18/17 12/25/17  Wieters, Hallie C, PA-C  JARDIANCE 25 MG TABS tablet Take 25 mg by mouth daily. 10/25/17   [provider]    Family History Family History  Problem Relation Age of Onset  . Cancer Mother        throat  . Heart disease Mother   . Heart disease Father   . Hypertension Brother   . Cancer Paternal Uncle     Social History Social History   Tobacco Use  . Smoking status: Former Smoker    Packs/day: 1.00    Years: 6.00    Pack years: 6.00    Types: Cigarettes    Last attempt to quit: 09/05/1980    Years since quitting: 37.3  . Smokeless tobacco: Never Used  Substance Use Topics  . Alcohol use: Yes    Comment: socially  . Drug use: No     Allergies   Patient has no known allergies.   Review of Systems Review of Systems  Constitutional: Negative for chills, diaphoresis and fever.  HENT: Negative for facial swelling and sore throat.   Respiratory: Negative for shortness of breath.   Cardiovascular: Negative for chest pain.  Gastrointestinal: Negative for abdominal pain, nausea and vomiting.  Genitourinary: Negative for dysuria.  Musculoskeletal: Positive for arthralgias and back pain.  Skin: Negative for rash and wound.  Neurological: Negative for headaches.  Psychiatric/Behavioral: The patient is not nervous/anxious.      Physical Exam Updated Vital Signs BP (!) 122/52   Pulse 69   Temp 99 F (37.2 C) (Oral)   Resp 17   Ht 5' 3" (1.6 m)   Wt 88.5 kg (195 lb)   SpO2 99%   BMI 34.54 kg/m   Physical Exam  Constitutional: She appears well-developed and well-nourished. No distress.  HENT:  Head: Normocephalic and atraumatic.  Mouth/Throat: Oropharynx is clear and moist. No oropharyngeal exudate.  Eyes: Pupils are equal, round, and reactive to light. Conjunctivae are normal. Right  eye exhibits no  discharge. Left eye exhibits no discharge. No scleral icterus.  Neck: Normal range of motion. Neck supple. No thyromegaly present.  Cardiovascular: Normal rate, regular rhythm, normal heart sounds and intact distal pulses. Exam reveals no gallop and no friction rub.  No murmur heard. Pulmonary/Chest: Effort normal and breath sounds normal. No stridor. No respiratory distress. She has no wheezes. She has no rales. She exhibits no tenderness.  Abdominal: Soft. Bowel sounds are normal. She exhibits no distension. There is no tenderness. There is no rebound and no guarding.  Musculoskeletal: She exhibits no edema.  No tenderness on palpation of the bilateral shoulders or upper back  Lymphadenopathy:    She has no cervical adenopathy.  Neurological: She is alert. Coordination normal.  Skin: Skin is warm and dry. No rash noted. She is not diaphoretic. No pallor.  Psychiatric: She has a normal mood and affect.  Nursing note and vitals reviewed.    ED Treatments / Results  Labs (all labs ordered are listed, but only abnormal results are displayed) Labs Reviewed  COMPREHENSIVE METABOLIC PANEL - Abnormal; Notable for the following components:      Result Value   CO2 20 (*)    Glucose, Bld 136 (*)    BUN 28 (*)    Creatinine, Ser 1.25 (*)    Total Protein 6.4 (*)    Albumin 3.2 (*)    GFR calc non Af Amer 39 (*)    GFR calc Af Amer 45 (*)    All other components within normal limits  CBC WITH DIFFERENTIAL/PLATELET - Abnormal; Notable for the following components:   WBC 68.1 (*)    RBC 3.57 (*)    Hemoglobin 11.1 (*)    HCT 33.9 (*)    RDW 17.1 (*)    Platelets 66 (*)    Neutro Abs 12.9 (*)    Lymphs Abs 19.7 (*)    Monocytes Absolute 3.4 (*)    All other components within normal limits  BRAIN NATRIURETIC PEPTIDE - Abnormal; Notable for the following components:   B Natriuretic Peptide 184.2 (*)    All other components within normal limits  I-STAT TROPONIN, ED    EKG EKG  Interpretation  Date/Time:  Sunday December 24 2017 12:52:10 EDT Ventricular Rate:  66 PR Interval:  146 QRS Duration: 118 QT Interval:  446 QTC Calculation: 467 R Axis:   -47 Text Interpretation:  Normal sinus rhythm Left axis deviation Possible Anterior infarct , age undetermined Abnormal ECG Left bundle branch block No significant change since last tracing Confirmed by Davonna Belling [phone]) on 12/24/2017 12:57:21 PM   Radiology Dg Chest 2 View  Result Date: 12/24/2017 CLINICAL DATA:  Onset of chest pain this morning. EXAM: CHEST - 2 VIEW COMPARISON:  PA and lateral chest 12/18/2017 and 05/13/2015. FINDINGS: Lungs are clear. Heart size is upper normal. No pneumothorax or pleural effusion. The patient is status post CABG. Thoracic spondylosis noted. IMPRESSION: No acute disease. Electronically Signed   By: Inge Rise M.D.   On: 12/24/2017 14:48    Procedures Procedures (including critical care time)  Medications Ordered in ED Medications  iopamidol (ISOVUE-370) 76 % injection (has no administration in time range)  morphine 4 MG/ML injection 4 mg (4 mg Intravenous Given 12/24/17 1340)  iopamidol (ISOVUE-370) 76 % injection 100 mL (80 mLs Intravenous Contrast Given 12/24/17 1556)     Initial Impression / Assessment and Plan / ED Course  I have reviewed the triage vital signs  and the nursing notes.  Pertinent labs & imaging results that were available during my care of the patient were reviewed by me and considered in my medical decision making (see chart for details).     Patient with sudden onset bilateral shoulder pain and upper back pain. CBC shows new significant leukocytosis, WBC 68.1, as well as platelets 66,000.  I consulted hematologist/oncologist, Dr. Alen Blew who evaluated the patient and advised, admission to medicine at [facility] for workup of leukemia. We will also conduct CT angio chest abdomen pelvis to rule out dissection, however leukemia much more likely cause  of symptoms.  Chest x-ray is negative.  EKG shows NSR and is unchanged from past.  Stable vitals. Troponin is negative.  BNP mildly elevated, 184.2.  CMP shows BUN 28, creatinine 1.25, protein 6.4, albumin 3.2.  I discussed patient case with Dr. Kyung Bacca with [facility] who will admit the patient.  I appreciate both consultants assistance with the patient.  Patient also evaluated by Dr. Alvino Chapel who guided the patient's management and agrees with plan.  Final Clinical Impressions(s) / ED Diagnoses   Final diagnoses:  Leukocytosis, unspecified type  Acute pain of both shoulders  Thrombocytopenia [facility])    ED Discharge Orders    None       Frederica Kuster, PA-C 12/24/17 1609    Davonna Belling, MD 12/26/17 1539

## 2017-12-25 DIAGNOSIS — M810 Age-related osteoporosis without current pathological fracture: Secondary | ICD-10-CM

## 2017-12-25 DIAGNOSIS — D696 Thrombocytopenia, unspecified: Secondary | ICD-10-CM

## 2017-12-25 DIAGNOSIS — D72828 Other elevated white blood cell count: Secondary | ICD-10-CM

## 2017-12-25 DIAGNOSIS — E860 Dehydration: Secondary | ICD-10-CM

## 2017-12-25 DIAGNOSIS — D649 Anemia, unspecified: Secondary | ICD-10-CM

## 2017-12-25 DIAGNOSIS — R1010 Upper abdominal pain, unspecified: Secondary | ICD-10-CM

## 2017-12-25 DIAGNOSIS — C9500 Acute leukemia of unspecified cell type not having achieved remission: Principal | ICD-10-CM

## 2017-12-25 DIAGNOSIS — K5792 Diverticulitis of intestine, part unspecified, without perforation or abscess without bleeding: Secondary | ICD-10-CM

## 2017-12-25 DIAGNOSIS — I700 Atherosclerosis of aorta: Secondary | ICD-10-CM

## 2017-12-25 LAB — BASIC METABOLIC PANEL
ANION GAP: 13 (ref 5–15)
BUN: 26 mg/dL — ABNORMAL HIGH (ref 6–20)
CHLORIDE: 102 mmol/L (ref 101–111)
CO2: 21 mmol/L — AB (ref 22–32)
Calcium: 9.1 mg/dL (ref 8.9–10.3)
Creatinine, Ser: 1.23 mg/dL — ABNORMAL HIGH (ref 0.44–1.00)
GFR calc Af Amer: 46 mL/min — ABNORMAL LOW (ref >60)
GFR calc non Af Amer: 39 mL/min — ABNORMAL LOW (ref >60)
GLUCOSE: 136 mg/dL — AB (ref 65–99)
POTASSIUM: 4 mmol/L (ref 3.5–5.1)
Sodium: 136 mmol/L (ref 135–145)

## 2017-12-25 LAB — URINALYSIS, COMPLETE (UACMP) WITH MICROSCOPIC
Bilirubin Urine: NEGATIVE
KETONES UR: NEGATIVE mg/dL
NITRITE: NEGATIVE
PH: 5 (ref 5.0–8.0)
Protein, ur: 100 mg/dL — AB
SPECIFIC GRAVITY, URINE: 1.025 (ref 1.005–1.030)

## 2017-12-25 LAB — PATHOLOGIST SMEAR REVIEW

## 2017-12-25 LAB — GLUCOSE, CAPILLARY
GLUCOSE-CAPILLARY: 174 mg/dL — AB (ref 65–99)
Glucose-Capillary: 142 mg/dL — ABNORMAL HIGH (ref 65–99)
Glucose-Capillary: 174 mg/dL — ABNORMAL HIGH (ref 65–99)
Glucose-Capillary: 157 mg/dL — ABNORMAL HIGH (ref 65–99)

## 2017-12-25 LAB — PROTIME-INR
INR: 1.37
Prothrombin Time: 16.8 seconds — ABNORMAL HIGH (ref 11.4–15.2)

## 2017-12-25 LAB — APTT: APTT: 35 s (ref 24–36)

## 2017-12-25 MED ORDER — AMOXICILLIN-POT CLAVULANATE 875-125 MG PO TABS: 1.0000 | ORAL_TABLET | Freq: Two times a day (BID) | ORAL | Status: DC

## 2017-12-25 MED ORDER — HYDROCODONE-ACETAMINOPHEN 5-325 MG PO TABS
1.0000 | ORAL_TABLET | ORAL | Status: DC | PRN
  Administered 2017-12-25 – 2017-12-26 (×7): 2 via ORAL
  Filled 2017-12-25 (×7): qty 2

## 2017-12-25 MED ORDER — LACTATED RINGERS IV SOLN
INTRAVENOUS | Status: AC
  Administered 2017-12-25: 1000 mL via INTRAVENOUS

## 2017-12-25 MED ORDER — AMOXICILLIN-POT CLAVULANATE 875-125 MG PO TABS
1.0000 | ORAL_TABLET | Freq: Two times a day (BID) | ORAL | Status: DC
  Administered 2017-12-25 – 2017-12-26 (×2): 1 via ORAL
  Filled 2017-12-25 (×3): qty 1

## 2017-12-25 MED ORDER — ALLOPURINOL 300 MG PO TABS
300.0000 mg | ORAL_TABLET | Freq: Every day | ORAL | Status: DC
  Administered 2017-12-25 – 2017-12-26 (×2): 300 mg via ORAL
  Filled 2017-12-25 (×2): qty 1

## 2017-12-25 MED ORDER — PROSIGHT PO TABS
1.0000 | ORAL_TABLET | Freq: Two times a day (BID) | ORAL | Status: DC
  Administered 2017-12-25 – 2017-12-26 (×3): 1 via ORAL
  Filled 2017-12-25 (×2): qty 1

## 2017-12-25 MED ORDER — HYDROXYUREA 500 MG PO CAPS: 1000.0000 mg | ORAL_CAPSULE | Freq: Every day | ORAL | Status: DC

## 2017-12-25 NOTE — Progress Notes (Signed)
[name]   DOB:01-01-1933   IR#:[id number]   BPZ#:[id number]  Subjective:  I met with the patient, her son [name], and the patient's husband in her hospital room today.  She started having severe pain in her upper girdle 2 days ago, which brought her to the emergency room.  The family thought she might be having pneumonia, but there was no fever and no shortness of breath or cough.  Instead she had a white cell count in excess of 60,000.  I have reviewed the peripheral blood film and the majority of the white cells are blasts.  I do not see our rods or obvious myeloid differentiation.  The patient denies unusual headaches, visual changes, nausea, vomiting, bruising or bleeding.  She was in her usual state of health until only 3 days ago.  A detailed review of systems today was otherwise noncontributory   Objective: Older white woman examined in bed Vitals:   12/25/17 1057 12/25/17 1058  BP: (!) 149/45 (!) 149/45  Pulse: 72 72  Resp:    Temp:    SpO2:      Body mass index is 34.54 kg/m.  Intake/Output Summary (Last 24 hours) at 12/25/2017 1220 Last data filed at 12/25/2017 1104 Gross per 24 hour  Intake 3 ml  Output 200 ml  Net -197 ml     Sclerae unicteric  No cervical or supraclavicular adenopathy  Lungs no rales or wheezes--auscultated anterolaterally  Heart regular rate and rhythm  Abdomen soft, +BS  Neuro nonfocal  Breast exam: Deferred  CBG (last 3)  Recent Labs    12/24/17 1848 12/24/17 2048 12/25/17 0845  GLUCAP 109* 192* 142*     Labs:  Lab Results  Component Value Date   WBC 67.4 (HH) 12/25/2017   HGB 11.5 (L) 12/25/2017   HCT 35.1 (L) 12/25/2017   MCV 95.9 12/25/2017   PLT 63 (L) 12/25/2017   NEUTROABS 2.0 12/25/2017    @LASTCHEMISTRY @  Urine Studies No results for input(s): UHGB, CRYS in the last 72 hours.  Invalid input(s): UACOL, UAPR, USPG, UPH, UTP, UGL, UKET, UBIL, UNIT, UROB, ULEU, UEPI, UWBC, URBC, UBAC, CAST, UCOM, BILUA  Basic  Metabolic Panel: Recent Labs  Lab 12/24/17 1310 12/25/17 0945  NA 136 136  K 4.2 4.0  CL 103 102  CO2 20* 21*  GLUCOSE 136* 136*  BUN 28* 26*  CREATININE 1.25* 1.23*  CALCIUM 9.1 9.1   GFR Estimated Creatinine Clearance: 36.5 mL/min (A) (by C-G formula based on SCr of 1.23 mg/dL (H)). Liver Function Tests: Recent Labs  Lab 12/24/17 1310  AST 21  ALT 15  ALKPHOS 81  BILITOT 0.8  PROT 6.4*  ALBUMIN 3.2*   No results for input(s): LIPASE, AMYLASE in the last 168 hours. No results for input(s): AMMONIA in the last 168 hours. Coagulation profile Recent Labs  Lab 12/25/17 0945  INR 1.37    CBC: Recent Labs  Lab 12/24/17 1310 12/25/17 0945  WBC 68.1* 67.4*  NEUTROABS 12.9* 2.0  HGB 11.1* 11.5*  HCT 33.9* 35.1*  MCV 95.0 95.9  PLT 66* 63*   Cardiac Enzymes: No results for input(s): CKTOTAL, CKMB, CKMBINDEX, TROPONINI in the last 168 hours. BNP: Invalid input(s): POCBNP CBG: Recent Labs  Lab 12/24/17 1848 12/24/17 2048 12/25/17 0845  GLUCAP 109* 192* 142*   D-Dimer No results for input(s): DDIMER in the last 72 hours. Hgb A1c No results for input(s): HGBA1C in the last 72 hours. Lipid Profile No results for input(s): CHOL,  HDL, LDLCALC, TRIG, CHOLHDL, LDLDIRECT in the last 72 hours. Thyroid function studies No results for input(s): TSH, T4TOTAL, T3FREE, THYROIDAB in the last 72 hours.  Invalid input(s): FREET3 Anemia work up No results for input(s): VITAMINB12, FOLATE, FERRITIN, TIBC, IRON, RETICCTPCT in the last 72 hours. Microbiology No results found for this or any previous visit (from the past 240 hour(s)).    Studies:  Dg Chest 2 View  Result Date: 12/24/2017 CLINICAL DATA:  Onset of chest pain this morning. EXAM: CHEST - 2 VIEW COMPARISON:  PA and lateral chest 12/18/2017 and 05/13/2015. FINDINGS: Lungs are clear. Heart size is upper normal. No pneumothorax or pleural effusion. The patient is status post CABG. Thoracic spondylosis noted.  IMPRESSION: No acute disease. Electronically Signed   By: Inge Rise M.D.   On: 12/24/2017 14:48   Ct Angio Chest/abd/pel For Dissection W And/or Wo Contrast  Result Date: 12/24/2017 CLINICAL DATA:  Patient experienced chest pain radiating to both arms and top of back at approx 0800. Pain 10/10. 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 80 ml of isovue 370 given. ^4m ISOVUE-370 IOPAMIDOL (ISOVUE-370) INJECTION 76% EXAM: CT ANGIOGRAPHY CHEST, ABDOMEN AND PELVIS TECHNIQUE: Multidetector CT imaging through the chest, abdomen and pelvis was performed using the standard protocol during bolus administration of intravenous contrast. Multiplanar reconstructed images and MIPs were obtained and reviewed to evaluate the vascular anatomy. CONTRAST:  812mISOVUE-370 IOPAMIDOL (ISOVUE-370) INJECTION 76% COMPARISON:  Chest x-ray 12/18/2017 and CT abdomen and pelvis on 08/07/2017 FINDINGS: CTA CHEST FINDINGS Cardiovascular: There is significant three-vessel coronary artery disease. Heart size is normal. No pericardial effusion. There is atherosclerotic calcification of the thoracic aorta not associated with aneurysm or dissection. Mediastinum/Nodes: No mediastinal, hilar, or axillary adenopathy. The visualized portion of the thyroid gland has a normal appearance. Esophagus is normal in appearance. Small hiatal hernia. Median sternotomy. Lungs/Pleura: Lungs are clear. No pleural effusion or pneumothorax. Musculoskeletal: Mild degenerative changes are seen in the thoracic spine. No suspicious lytic or blastic lesions are identified. Review of the MIP images confirms the above findings. CTA ABDOMEN AND PELVIS FINDINGS VASCULAR Aorta: There is atherosclerotic calcification of the aorta. No aneurysm or dissection. Celiac: Significant atherosclerotic calcification at the origin of the celiac axis and significant narrowing 1.4 centimeters from the origin. There is reconstituted flow distal to  the stenosis. SMA: Significant atherosclerotic calcification at the origin with mild narrowing. Renals: There is atherosclerotic calcification of the single renal arteries bilaterally. No significant stenosis. IMA: Patent without evidence of aneurysm, dissection, vasculitis or significant stenosis. Inflow: Unremarkable. Veins: No obvious venous abnormality within the limitations of this arterial phase study. Review of the MIP images confirms the above findings. NON-VASCULAR Hepatobiliary: The gallbladder is distended and contains numerous calcified stones. The liver is homogeneous Pancreas: Unremarkable. No pancreatic ductal dilatation or surrounding inflammatory changes. Spleen: Normal in size without focal abnormality. Adrenals/Urinary Tract: Adrenal glands are normal in appearance. No renal mass or hydronephrosis. There is irregular thickening of the urinary bladder wall. Small amount of air is identified within the bladder, raising question of recent catheterization or infection. Stomach/Bowel: Small hiatal hernia. Stomach is otherwise normal in appearance. Small bowel loops are normal in appearance. There is significant diverticular disease of the entire colon, particularly involving the sigmoid segments. Surrounding the sigmoid colon, there are inflammatory changes and punctate locules of gas consistent with micro perforation. No abscess Lymphatic: No retroperitoneal or mesenteric adenopathy. Reproductive: Uterus is absent.  No adnexal  mass. Other: No free pelvic fluid. Anterior abdominal wall is unremarkable. Musculoskeletal: Degenerative changes are identified in LOWER thoracic and lumbar spine. Review of the MIP images confirms the above findings. IMPRESSION: 1. Acute sigmoid diverticulitis with evidence for microperforation. There is no associated abscess. 2. Significant atherosclerotic disease of the thoracic and abdominal aorta. No aneurysm or dissection. Aortic atherosclerosis. (ICD10-I70.0) 3.  Three-vessel coronary artery disease. 4. Small hiatal hernia. 5. Atherosclerosis of the celiac, SMA, renal arteries, and IMA. No evidence for occlusion. There is tight stenosis of the proximal celiac axis with distal reconstitution. 6. Cholelithiasis. 7. Irregularly thickened urinary bladder wall and air within the urinary bladder, consistent with urinary tract infection. This may be secondary process related to the sigmoid diverticulitis. 8. Hysterectomy. Electronically Signed   By: Nolon Nations M.D.   On: 12/24/2017 17:59    Assessment: 84 y.o. [place] woman with a remote history of breast cancer, never requiring chemotherapy, now admitted with uncontrolled pain and with a white cell count of 68.1, mostly blasts, with mild anemia and moderate thrombocytopenia   (1)  status post left lumpectomy and sentinel lymph node sampling 11/21/2011 for a T1c N0, stage IA invasive ductal carcinoma, grade 3, strongly estrogen and progesterone receptor positive, with an MIB-1 of 47%, and no HER-2 amplification.   (2)  Completed radiation 01/10/2012 and started anastrozole June 2013, stopped August 2018  (3)  osteoporosis, receiving zoledronic acid annually, first dose given on 05/24/2012, last dose 05/13/2015             (a) Bone density 12/19/2016 finds the T score at -2.6 (was -2.8 on 04/29/2014  (4) acute leukemia: White cell count of 68.1 on 12/24/2017, review of the blood film showing mostly blasts, not clearly myeloid  (a) note white cell count of 7.0 four months ago, with hemoglobin then 14.4 and platelets 245,000     Plan:  I discussed the likely diagnosis of acute leukemia with the patient and her family.  She will have her bone marrow biopsy and flow cytometry tomorrow morning and by tomorrow evening we should have a more definitive idea of what we are dealing with.  I also set her up for a echocardiogram today in preparation for discussion of possible treatments.  Oriented the patient  to the fact that acute leukemia's are rapidly fatal if untreated.  Also we generally refer acute leukemias to [facility] and I will try to get her an appointment there within the week.  At that time she can discuss her treatment options in more detail with their leukemia service.   Gaspar Skeeters understands the treatment of acute leukemia generally is very intense, and that cure in patients her age is rare.  Nevertheless she is interested in proceeding with treatment if at all possible.  Her pain will improve once we start treatment.  I am holding off on Hydrea until after her bone marrow biopsy so as not to muddy those results  If the patient's pain is well controlled, since she is not having fever or bleeding problems, she could be discharged to home.  Otherwise transferred to [facility] could be considered.  I will follow with you.   Chauncey Cruel, MD 12/25/2017  12:20 PM Medical Oncology and Hematology [facility] [address] [place], [place] [address] Tel. [phone]    Fax. [phone]

## 2017-12-25 NOTE — Consult Note (Signed)
Chief Complaint: Patient was seen in consultation today for CT-guided bone marrow biopsy Chief Complaint  Patient presents with  . Chest Pain    Referring Physician(s): ZOXWRU,E  Supervising Physician: Aletta Edouard  Patient Status: [facility] - In-pt  History of Present Illness: [name] is a 84 y.o. female with prior history of left breast cancer in 2013, coronary artery disease/ prior CABG,, diabetes, diverticulitis, hypertension who was recently admitted to [facility] with bilateral arm, shoulder, upper back pain.  Laboratory evaluation revealed marked leukocytosis, currently 68K with peripheral blasts concerning for possible leukemia.  Request now received from oncology for CT-guided bone marrow biopsy for further evaluation.  Past Medical History:  Diagnosis Date  . Arthritis   . Breast cancer ([place]) 10/20/11   L breast, ER/PR+, HER2 -  . Breast cancer, left breast ([place]) 11/01/2011  . Bronchitis    hx of  . CAD (coronary artery disease)    LHC 06/14/11: LAD 80% and 80% after Dx, Dx 80% (Dx was small), oOM 80%, mOM 70% (OM was small), oRCA 70-80%, mRCA 60-70%, EF 55% - referred for CABG;   Echo 06/14/11: mild focal basal septal hypertrophy, grade 1 diast dysfxn, mild LAE, mild RVE, PASP 37.    . Carotid stenosis    dopplers 45/40: LICA 98-11%  . Diabetes mellitus    x 5-6 yrs  . Diverticulitis of colon with perforation   . DM2 (diabetes mellitus, type 2) ([place])   . GERD (gastroesophageal reflux disease)   . History of kidney stones   . HLD (hyperlipidemia)   . HTN (hypertension)   . Hypothyroidism   . Kidney stones   . Personal history of radiation therapy   . Psoriasis   . RLS (restless legs syndrome)   . S/P CABG x 2 06/2011   Dr. Servando Snare (L-LAD, [facility])  . S/P radiation therapy 12/20/11 - 01/10/12   Left Breast: 4256 cGy/ 16 Fractions    Past Surgical History:  Procedure Laterality Date  . ABDOMINAL HYSTERECTOMY     age 53's  . APPENDECTOMY    .  arthroscopy right knee    . BREAST BIOPSY Left 11/04/2011   Benign  . BREAST BIOPSY Left 10/20/2011   Malignant  . breast biospy  11/04/11   Breast, Left, Needle Core Biopsy 6 Oclock, Benign Breast Tissue wtih Fibrocystic  Changes: Microcalcifications Identified, No Neoplasm or Malignancy Identified  . breast biospy  10/20/11   Breast, Left, Needle Core Biopsy LOQ: Invsive ductal Carcinoman  . BREAST LUMPECTOMY Left 11/22/11   Breast, Lumpectomy, Left with Sentinel Node Biopsy: Invasive High Grade Ductal Carcinoma : No lymphovascular Invasion: High  Grade Ductal Carcinoma In-Situ with Calcification and Comedo Necrosis  . CARDIAC CATHETERIZATION  2012  . COLON RESECTION  1977  . COLON SURGERY     part colectomy-  . COLONOSCOPY    . CORONARY ARTERY BYPASS GRAFT  10/12  . Off-pump coronary artery bypass grafting x2  06/16/2011  . SHOULDER ARTHROSCOPY WITH SUBACROMIAL DECOMPRESSION, ROTATOR CUFF REPAIR AND BICEP TENDON REPAIR Right 10/16/2014   Procedure: RIGHT SHOULDER ARTHROSCOPY WITH SUBACROMIAL DECOMPRESSION, DISTAL CLAVICLE RESECTION AND ROTATOR CUFF REPAIR ;  Surgeon: Marin Shutter, MD;  Location: [place];  Service: Orthopedics;  Laterality: Right;  . VEIN BYPASS SURGERY  1999    Allergies: Patient has no known allergies.  Medications: Prior to Admission medications   Medication Sig Start Date End Date Taking? Authorizing Provider  acetaminophen (TYLENOL) 500 MG tablet Take  500 mg by mouth every 6 (six) hours as needed for moderate pain.   Yes [provider]  aspirin 81 MG tablet Take 81 mg by mouth every evening.    Yes [provider]  benzonatate (TESSALON) 200 MG capsule Take 1 capsule (200 mg total) by mouth every 8 (eight) hours. Patient taking differently: Take 200 mg by mouth 2 (two) times daily as needed.  12/18/17  Yes Wieters, Hallie C, PA-C  BIOTIN FORTE PO Take 1 tablet by mouth daily.   Yes [provider]  calcium carbonate (OS-CAL - DOSED IN MG  OF ELEMENTAL CALCIUM) 1250 (500 Ca) MG tablet Take 1 tablet by mouth at bedtime.   Yes [provider]  canagliflozin (INVOKANA) 300 MG TABS tablet Take 300 mg by mouth daily before breakfast.   Yes [provider]  Cholecalciferol (VITAMIN D-3) 5000 UNITS TABS Take 1 tablet by mouth daily.   Yes [provider]  CRESTOR 20 MG tablet Take 20 mg by mouth every evening.  11/10/11  Yes [provider]  diazepam (VALIUM) 5 MG tablet Take 0.5-1 tablets (2.5-5 mg total) by mouth every 6 (six) hours as needed for muscle spasms or sedation. Patient taking differently: Take 5 mg by mouth every 6 (six) hours as needed for muscle spasms or sedation.  10/16/14  Yes Shuford, Olivia Mackie, PA-C  esomeprazole (NEXIUM) 40 MG capsule Take 40 mg by mouth daily before breakfast.     Yes [provider]  ezetimibe (ZETIA) 10 MG tablet Take 10 mg by mouth every evening.    Yes [provider]  furosemide (LASIX) 40 MG tablet TAKE 1 TABLET DAILY 11/06/17  Yes Weaver, Scott T, PA-C  gabapentin (NEURONTIN) 100 MG capsule TAKE 1 CAPSULE AT BEDTIME  AS NEEDED 08/18/17  Yes Star Age, MD  ibuprofen (ADVIL,MOTRIN) 200 MG tablet Take 400 mg by mouth every 6 (six) hours as needed for moderate pain (shoulder pain).   Yes [provider]  levothyroxine (SYNTHROID, LEVOTHROID) 137 MCG tablet Take 137 mcg by mouth daily before breakfast.   Yes [provider]  losartan (COZAAR) 50 MG tablet Take 1 tablet (50 mg total) by mouth 2 (two) times daily. 10/11/17  Yes Burnell Blanks, MD  metoprolol (LOPRESSOR) 50 MG tablet Take 1.5 tablets (75 mg total) by mouth 2 (two) times daily. Please keep 07/01/16 appt for further refills 05/10/16  Yes Burnell Blanks, MD  Multiple Vitamin (MULTIVITAMIN) capsule Take 1 capsule by mouth every evening.    Yes [provider]  Multiple Vitamins-Minerals (EYE VITAMINS) CAPS Take 1 capsule by mouth 2 (two) times daily.     Yes [provider]  mupirocin ointment (BACTROBAN) 2 % Apply 1 application topically 2 (two) times daily. Patient taking differently: Apply 1 application topically as needed.  09/26/16  Yes Trula Slade, DPM  pioglitazone (ACTOS) 15 MG tablet Take 15 mg by mouth daily.   Yes [provider]  potassium chloride (KLOR-CON M10) 10 MEQ tablet Take 1 tablet (10 mEq total) by mouth daily. 11/06/17  Yes Burnell Blanks, MD  rOPINIRole (REQUIP) 3 MG tablet Take 3 mg by mouth at bedtime.    Yes [provider]  fluticasone (FLONASE) 50 MCG/ACT nasal spray Place 1 spray into both nostrils daily for 7 days. 12/18/17 12/25/17  Wieters, Hallie C, PA-C  JARDIANCE 25 MG TABS tablet Take 25 mg by mouth daily. 10/25/17   [provider]  Family History  Problem Relation Age of Onset  . Cancer Mother        throat  . Heart disease Mother   . Heart disease Father   . Hypertension Brother   . Cancer Paternal Uncle     Social History   Socioeconomic History  . Marital status: Married    Spouse name: Not on file  . Number of children: 1  . Years of education: HS  . Highest education level: Not on file  Occupational History  . 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 Financial resource strain: Not hard at all  . Food insecurity:    Worry: Never true    Inability: Never true  . Transportation needs:    Medical: No    Non-medical: No  Tobacco Use  . Smoking status: Former Smoker    Packs/day: 1.00    Years: 6.00    Pack years: 6.00    Types: Cigarettes    Last attempt to quit: 09/05/1980    Years since quitting: 37.3  . Smokeless tobacco: Never Used  Substance and Sexual Activity  . Alcohol use: Yes    Comment: socially  . Drug use: No  . Sexual activity: Yes    Partners: Male    Birth control/protection: None    Comment: menarche 63, p2, 1 stillbirth, 1st preg age 72, HRT x many yrs  Lifestyle   . Physical activity:    Days per week: 2 days    Minutes per session: 20 min  . Stress: To some extent  Relationships  . Social connections:    Talks on phone: More than three times a week    Gets together: More than three times a week    Attends religious service: More than 4 times per year    Active member of club or organization: No    Attends meetings of clubs or organizations: Never    Relationship status: Married  Other Topics Concern  . Not on file  Social History Narrative   Denies caffeine use       Review of Systems see above; currently denies fever, headache, chest pain, worsening dyspnea, cough, abdominal pain, nausea, vomiting or bleeding.  Vital Signs: BP (!) 135/40 (BP Location: Right Arm)   Pulse 66   Temp 99.6 F (37.6 C) (Oral)   Resp 20   Ht _0  (1.6 m)   Wt 195 lb (88.5 kg)   SpO2 97%   BMI 34.54 kg/m   Physical Exam awake, alert.  Chest clear to auscultation bilaterally.  Heart with regular rate and rhythm.  Abdomen soft, positive bowel sounds, nontender.  No significant pretibial edema  Imaging: Dg Chest 2 View  Result Date: 12/24/2017 CLINICAL DATA:  Onset of chest pain this morning. EXAM: CHEST - 2 VIEW COMPARISON:  PA and lateral chest 12/18/2017 and 05/13/2015. FINDINGS: Lungs are clear. Heart size is upper normal. No pneumothorax or pleural effusion. The patient is status post CABG. Thoracic spondylosis noted. IMPRESSION: No acute disease. Electronically Signed   By: Inge Rise M.D.   On: 12/24/2017 14:48   Dg Chest 2 View  Result Date: 12/18/2017 CLINICAL DATA:  Chest pain. EXAM: CHEST - 2 VIEW COMPARISON:  Radiographs of May 13, 2015. FINDINGS: Stable cardiomediastinal silhouette. Status post coronary artery bypass graft. Atherosclerosis of thoracic aorta is noted. No pneumothorax or pleural effusion is noted. Both lungs are clear. The visualized skeletal structures are  unremarkable. IMPRESSION: No active cardiopulmonary disease.  Aortic Atherosclerosis (ICD10-I70.0). Electronically Signed   By: Marijo Conception, M.D.   On: 12/18/2017 12:18   Mm Diag Breast Tomo Bilateral  Result Date: 12/20/2017 CLINICAL DATA:  84 year old female for annual bilateral mammograms. History of LEFT breast cancer and lumpectomy in 2013. EXAM: DIGITAL DIAGNOSTIC BILATERAL MAMMOGRAM WITH CAD AND TOMO COMPARISON:  Previous exam(s). ACR Breast Density Category b: There are scattered areas of fibroglandular density. FINDINGS: 2D and 3D full field views of both breasts and a magnification view of the lumpectomy site demonstrate no suspicious mass, nonsurgical distortion or worrisome calcifications. LEFT breast lumpectomy changes are again noted. Mammographic images were processed with CAD. IMPRESSION: No mammographic evidence of breast malignancy. RECOMMENDATION: Bilateral screening mammograms in 1 year. I have discussed the findings and recommendations with the patient. Results were also provided in writing at the conclusion of the visit. If applicable, a reminder letter will be sent to the patient regarding the next appointment. BI-RADS CATEGORY  2: Benign. Electronically Signed   By: Margarette Canada M.D.   On: 12/20/2017 14:51   Ct Angio Chest/abd/pel For Dissection W And/or Wo Contrast  Result Date: 12/24/2017 CLINICAL DATA:  Patient experienced chest pain radiating to both arms and top of back at approx 0800. Pain 10/10. 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 80 ml of isovue 370 given. ^16m ISOVUE-370 IOPAMIDOL (ISOVUE-370) INJECTION 76% EXAM: CT ANGIOGRAPHY CHEST, ABDOMEN AND PELVIS TECHNIQUE: Multidetector CT imaging through the chest, abdomen and pelvis was performed using the standard protocol during bolus administration of intravenous contrast. Multiplanar reconstructed images and MIPs were obtained and reviewed to evaluate the vascular anatomy. CONTRAST:  835mISOVUE-370 IOPAMIDOL (ISOVUE-370) INJECTION 76% COMPARISON:   Chest x-ray 12/18/2017 and CT abdomen and pelvis on 08/07/2017 FINDINGS: CTA CHEST FINDINGS Cardiovascular: There is significant three-vessel coronary artery disease. Heart size is normal. No pericardial effusion. There is atherosclerotic calcification of the thoracic aorta not associated with aneurysm or dissection. Mediastinum/Nodes: No mediastinal, hilar, or axillary adenopathy. The visualized portion of the thyroid gland has a normal appearance. Esophagus is normal in appearance. Small hiatal hernia. Median sternotomy. Lungs/Pleura: Lungs are clear. No pleural effusion or pneumothorax. Musculoskeletal: Mild degenerative changes are seen in the thoracic spine. No suspicious lytic or blastic lesions are identified. Review of the MIP images confirms the above findings. CTA ABDOMEN AND PELVIS FINDINGS VASCULAR Aorta: There is atherosclerotic calcification of the aorta. No aneurysm or dissection. Celiac: Significant atherosclerotic calcification at the origin of the celiac axis and significant narrowing 1.4 centimeters from the origin. There is reconstituted flow distal to the stenosis. SMA: Significant atherosclerotic calcification at the origin with mild narrowing. Renals: There is atherosclerotic calcification of the single renal arteries bilaterally. No significant stenosis. IMA: Patent without evidence of aneurysm, dissection, vasculitis or significant stenosis. Inflow: Unremarkable. Veins: No obvious venous abnormality within the limitations of this arterial phase study. Review of the MIP images confirms the above findings. NON-VASCULAR Hepatobiliary: The gallbladder is distended and contains numerous calcified stones. The liver is homogeneous Pancreas: Unremarkable. No pancreatic ductal dilatation or surrounding inflammatory changes. Spleen: Normal in size without focal abnormality. Adrenals/Urinary Tract: Adrenal glands are normal in appearance. No renal mass or hydronephrosis. There is irregular thickening  of the urinary bladder wall. Small amount of air is identified within the bladder, raising question of recent catheterization or infection. Stomach/Bowel: Small hiatal hernia. Stomach is otherwise normal in appearance. Small bowel loops are  normal in appearance. There is significant diverticular disease of the entire colon, particularly involving the sigmoid segments. Surrounding the sigmoid colon, there are inflammatory changes and punctate locules of gas consistent with micro perforation. No abscess Lymphatic: No retroperitoneal or mesenteric adenopathy. Reproductive: Uterus is absent.  No adnexal mass. Other: No free pelvic fluid. Anterior abdominal wall is unremarkable. Musculoskeletal: Degenerative changes are identified in LOWER thoracic and lumbar spine. Review of the MIP images confirms the above findings. IMPRESSION: 1. Acute sigmoid diverticulitis with evidence for microperforation. There is no associated abscess. 2. Significant atherosclerotic disease of the thoracic and abdominal aorta. No aneurysm or dissection. Aortic atherosclerosis. (ICD10-I70.0) 3. Three-vessel coronary artery disease. 4. Small hiatal hernia. 5. Atherosclerosis of the celiac, SMA, renal arteries, and IMA. No evidence for occlusion. There is tight stenosis of the proximal celiac axis with distal reconstitution. 6. Cholelithiasis. 7. Irregularly thickened urinary bladder wall and air within the urinary bladder, consistent with urinary tract infection. This may be secondary process related to the sigmoid diverticulitis. 8. Hysterectomy. Electronically Signed   By: Nolon Nations M.D.   On: 12/24/2017 17:59    Labs:  CBC: Recent Labs    04/20/17 0812 08/07/17 0958 12/24/17 1310  WBC 6.1 7.0 68.1*  HGB 14.2 14.4 11.1*  HCT 42.9 44.0 33.9*  PLT 222 245 66*    COAGS: No results for input(s): INR, APTT in the last 8760 hours.  BMP: Recent Labs    04/20/17 0812 08/07/17 0958 12/24/17 1310  NA 140 139 136  K 4.4  4.0 4.2  CL  --  103 103  CO2 26 25 20*  GLUCOSE 128 183* 136*  BUN 18.8 23* 28*  CALCIUM 10.2 9.8 9.1  CREATININE 0.9 1.01* 1.25*  GFRNONAA  --  50* 39*  GFRAA  --  58* 45*    LIVER FUNCTION TESTS: Recent Labs    04/20/17 0812 08/07/17 0958 12/24/17 1310  BILITOT 1.05 0.9 0.8  AST _0 ALT _1 ALKPHOS 79 96 81  PROT 6.8 7.5 6.4*  ALBUMIN 3.6 4.3 3.2*    TUMOR MARKERS: No results for input(s): AFPTM, CEA, CA199, CHROMGRNA in the last 8760 hours.  Assessment and Plan: 84 y.o. female with prior history of left breast cancer in 2013, coronary artery disease/ prior CABG,, diabetes, diverticulitis, hypertension who was recently admitted to [facility] with bilateral arm, shoulder, upper back pain.  Laboratory evaluation revealed marked leukocytosis, currently 68K with peripheral blasts concerning for possible leukemia.  Request now received from oncology for CT-guided bone marrow biopsy for further evaluation.Risks and benefits discussed with the patient/family including, but not limited to bleeding, infection, damage to adjacent structures or low yield requiring additional tests.  All of the patient's questions were answered, patient is agreeable to proceed. Consent signed and in chart.  Procedure tent scheduled for 4/23 am   Thank you for this interesting consult.  I greatly enjoyed meeting DOSHIA DALIA and look forward to participating in their care.  A copy of this report was sent to the requesting provider on this date.  Electronically Signed: D. Rowe Robert, PA-C 12/25/2017, 9:53 AM   I spent a total of 25 minutes    in face to face in clinical consultation, greater than 50% of which was counseling/coordinating care for CT-guided bone marrow biopsy

## 2017-12-25 NOTE — Progress Notes (Signed)
PROGRESS NOTE  CHANIE SOUCEK OXB:[id number] DOB: 08/17/1933 DOA: 12/24/2017 PCP: Lorne Skeens, MD  Brief Narrative: 84 year old woman PMH breast cancer, presented with sudden onset of bilateral shoulder and upper back pain.  She was found to have marked leukocytosis and was admitted for further evaluation of suspected leukemia.  Assessment/Plan Suspected acute leukemia with marked leukocytosis, with associated bilateral shoulder pain and upper back pain.  CT negative for dissection. --Oncology following with the following recommendations  --Bone marrow biopsy with flow cytometry planned for tomorrow --Echocardiogram is associated bilateral shoulder pain and upper back pain. --If pain is well controlled and no bleeding or fever, could possibly be discharged home.  Oncology will arrange follow-up at [facility].  Acute kidney injury versus dehydration --No significant improvement.  Hold Lasix and losartan today.  Recheck BMP in a.m.  CT suggested acute sigmoid diverticulitis with inflammatory changes and punctate locules of gas consistent with microperforation.  No abscess. --Patient completely asymptomatic, no abdominal pain.  She has had diverticulitis in the past.  However she is immunocompromised.  Given her clinical stability, start oral antibiotics.  Thrombocytopenia secondary to suspected leukemia --Follow clinically, recheck CBC in a.m.  Diabetes mellitus type 2 --Blood sugars stable.  Aortic atherosclerosis.  Atherosclerosis of the celiac, SMA, renal arteries, IMA.  No evidence of occlusion.  Three-vessel coronary disease by CT. --Asymptomatic.  DVT prophylaxis: SCDs Code Status: full Family Communication: husband at bedside Disposition Plan: home    Murray Hodgkins, MD  [facility] Direct contact: [phone] --[place] [place]  --[email or web address]; password [email or web address]  7PM-7AM contact night coverage as above 12/25/2017, 6:13 PM  LOS: 1 day    Consultants:  Oncology  Procedures:    Antimicrobials:  Augmentin 4/22  Interval history/Subjective: Pain in the shoulders and arms is currently well controlled with pain medication.  She has had some burning with urination.  Some constipation.  Absolutely no abdominal pain.  She does report a history of diverticulitis apparently recurrent.  No diverticular symptoms now.  Objective: Vitals:  Vitals:   12/25/17 1058 12/25/17 1646  BP: (!) 149/45 (!) 142/53  Pulse: 72 74  Resp:  20  Temp:  98.8 F (37.1 C)  SpO2:  96%    Exam:  Constitutional:  . Appears calm and comfortable sitting on the side of the bed Respiratory:  . CTA bilaterally, no w/r/r.  . Respiratory effort normal.  Cardiovascular:  . RRR, no m/r/g . No LE extremity edema   Abdomen:  . Soft, nontender, nondistended Psychiatric:  . Mental status o Mood, affect appropriate  I have personally reviewed the following:   Labs:  BUN 26, creatinine 1.23, no significant improvement.  Baseline creatinine appears to be approximately 1 or less.  WBC 67.4, hemoglobin 11.5, platelets 63  Imaging studies:  CTs and chest x-ray noted  Medical tests:  EKG on admission and apparently reviewed: Sinus rhythm, left bundle branch block, old  Scheduled Meds: . allopurinol  300 mg Oral Daily  . amoxicillin-clavulanate  1 tablet Oral Q12H  . aspirin  81 mg Oral QPM  . calcium carbonate  1 tablet Oral QHS  . cholecalciferol  5,000 Units Oral Daily  . ezetimibe  10 mg Oral QPM  . fluticasone  1 spray Each Nare Daily  . gabapentin  100 mg Oral QHS  . insulin aspart  0-5 Units Subcutaneous QHS  . insulin aspart  0-9 Units Subcutaneous TID WC  . levothyroxine  137 mcg Oral QAC breakfast  . metoprolol  tartrate  75 mg Oral BID  . multivitamin  1 tablet Oral BID  . multivitamin with minerals  1 tablet Oral QPM  . pantoprazole  40 mg Oral Daily  . pioglitazone  15 mg Oral Daily  . potassium chloride  10 mEq Oral  Daily  . rOPINIRole  3 mg Oral QHS  . rosuvastatin  20 mg Oral QPM  . sodium chloride flush  3 mL Intravenous Q12H   Continuous Infusions: . lactated ringers      Active Problems:   Acute leukemia (HCC)   Dehydration   Acute diverticulitis   Thrombocytopenia (HCC)   Aortic atherosclerosis (HCC)   LOS: 1 day

## 2017-12-26 ENCOUNTER — Inpatient Hospital Stay (HOSPITAL_COMMUNITY): Payer: Medicare Other

## 2017-12-26 ENCOUNTER — Encounter (HOSPITAL_COMMUNITY): Payer: Self-pay | Admitting: Radiology

## 2017-12-26 DIAGNOSIS — I340 Nonrheumatic mitral (valve) insufficiency: Secondary | ICD-10-CM

## 2017-12-26 DIAGNOSIS — I361 Nonrheumatic tricuspid (valve) insufficiency: Secondary | ICD-10-CM

## 2017-12-26 LAB — ECHOCARDIOGRAM COMPLETE
HEIGHTINCHES: 63 in
WEIGHTICAEL: 3120 [oz_av]

## 2017-12-26 LAB — CBC WITH DIFFERENTIAL/PLATELET
BASOS ABS: 0 10*3/uL (ref 0.0–0.1)
BASOS ABS: 0 10*3/uL (ref 0.0–0.1)
BASOS PCT: 0 %
BASOS PCT: 0 %
Blasts: 80 %
Blasts: 86 %
EOS PCT: 0 %
Eosinophils Absolute: 0 10*3/uL (ref 0.0–0.7)
Eosinophils Absolute: 0 10*3/uL (ref 0.0–0.7)
Eosinophils Relative: 0 %
HCT: 35.1 % — ABNORMAL LOW (ref 36.0–46.0)
HCT: 34.6 % — ABNORMAL LOW (ref 36.0–46.0)
HEMOGLOBIN: 11.5 g/dL — AB (ref 12.0–15.0)
Hemoglobin: 11.2 g/dL — ABNORMAL LOW (ref 12.0–15.0)
LYMPHS ABS: 8 10*3/uL — AB (ref 0.7–4.0)
LYMPHS PCT: 17 %
Lymphocytes Relative: 11 %
Lymphs Abs: 11.5 10*3/uL — ABNORMAL HIGH (ref 0.7–4.0)
MCH: 31.4 pg (ref 26.0–34.0)
MCH: 31.2 pg (ref 26.0–34.0)
MCHC: 32.8 g/dL (ref 30.0–36.0)
MCHC: 32.4 g/dL (ref 30.0–36.0)
MCV: 95.9 fL (ref 78.0–100.0)
MCV: 96.4 fL (ref 78.0–100.0)
MONO ABS: 0 10*3/uL — AB (ref 0.1–1.0)
MONOS PCT: 0 %
Monocytes Absolute: 0 10*3/uL — ABNORMAL LOW (ref 0.1–1.0)
Monocytes Relative: 0 %
NEUTROS PCT: 3 %
Neutro Abs: 2 10*3/uL (ref 1.7–7.7)
Neutro Abs: 2.2 10*3/uL (ref 1.7–7.7)
Neutrophils Relative %: 3 %
Platelets: 63 10*3/uL — ABNORMAL LOW (ref 150–400)
Platelets: 56 10*3/uL — ABNORMAL LOW (ref 150–400)
RBC: 3.66 MIL/uL — ABNORMAL LOW (ref 3.87–5.11)
RBC: 3.59 MIL/uL — AB (ref 3.87–5.11)
RDW: 17.4 % — ABNORMAL HIGH (ref 11.5–15.5)
RDW: 17.2 % — AB (ref 11.5–15.5)
WBC: 67.4 10*3/uL (ref 4.0–10.5)
WBC: 72.9 10*3/uL — AB (ref 4.0–10.5)

## 2017-12-26 LAB — GLUCOSE, CAPILLARY
GLUCOSE-CAPILLARY: 134 mg/dL — AB (ref 65–99)
Glucose-Capillary: 113 mg/dL — ABNORMAL HIGH (ref 65–99)
Glucose-Capillary: 120 mg/dL — ABNORMAL HIGH (ref 65–99)

## 2017-12-26 LAB — BASIC METABOLIC PANEL
ANION GAP: 8 (ref 5–15)
BUN: 28 mg/dL — ABNORMAL HIGH (ref 6–20)
CALCIUM: 8.6 mg/dL — AB (ref 8.9–10.3)
CO2: 22 mmol/L (ref 22–32)
CREATININE: 1.25 mg/dL — AB (ref 0.44–1.00)
Chloride: 104 mmol/L (ref 101–111)
GFR calc Af Amer: 45 mL/min — ABNORMAL LOW (ref >60)
GFR calc non Af Amer: 39 mL/min — ABNORMAL LOW (ref >60)
GLUCOSE: 159 mg/dL — AB (ref 65–99)
Potassium: 4.7 mmol/L (ref 3.5–5.1)
Sodium: 134 mmol/L — ABNORMAL LOW (ref 135–145)

## 2017-12-26 LAB — URIC ACID: Uric Acid, Serum: 5.5 mg/dL (ref 2.3–6.6)

## 2017-12-26 LAB — LACTATE DEHYDROGENASE: LDH: 450 U/L — ABNORMAL HIGH (ref 98–192)

## 2017-12-26 MED ORDER — FENTANYL CITRATE (PF) 100 MCG/2ML IJ SOLN
INTRAMUSCULAR | Status: AC | PRN
  Administered 2017-12-26: 50 ug via INTRAVENOUS

## 2017-12-26 MED ORDER — HYDROXYUREA 500 MG PO CAPS: 1000.0000 mg | ORAL_CAPSULE | Freq: Every day | ORAL | 0 refills | Status: AC

## 2017-12-26 MED ORDER — HYDROXYUREA 500 MG PO CAPS
1000.0000 mg | ORAL_CAPSULE | Freq: Every day | ORAL | Status: DC
  Administered 2017-12-26: 1000 mg via ORAL
  Filled 2017-12-26: qty 2

## 2017-12-26 MED ORDER — MIDAZOLAM HCL 2 MG/2ML IJ SOLN
INTRAMUSCULAR | Status: AC | PRN
  Administered 2017-12-26: 1 mg via INTRAVENOUS

## 2017-12-26 MED ORDER — FLUMAZENIL 0.5 MG/5ML IV SOLN
INTRAVENOUS | Status: AC
  Filled 2017-12-26: qty 5

## 2017-12-26 MED ORDER — HYDROCODONE-ACETAMINOPHEN 5-325 MG PO TABS: 1.0000 | ORAL_TABLET | Freq: Four times a day (QID) | ORAL | 0 refills | Status: AC | PRN

## 2017-12-26 MED ORDER — SENNOSIDES-DOCUSATE SODIUM 8.6-50 MG PO TABS
2.0000 | ORAL_TABLET | Freq: Two times a day (BID) | ORAL | Status: DC
  Administered 2017-12-26: 2 via ORAL
  Filled 2017-12-26: qty 2

## 2017-12-26 MED ORDER — POLYETHYLENE GLYCOL 3350 17 G PO PACK: 17.0000 g | PACK | Freq: Two times a day (BID) | ORAL | Status: AC

## 2017-12-26 MED ORDER — ALLOPURINOL 300 MG PO TABS: 300.0000 mg | ORAL_TABLET | Freq: Every day | ORAL | 0 refills | Status: AC

## 2017-12-26 MED ORDER — ONDANSETRON HCL 4 MG PO TABS: 4.0000 mg | ORAL_TABLET | Freq: Four times a day (QID) | ORAL | 0 refills | Status: AC | PRN

## 2017-12-26 MED ORDER — POLYETHYLENE GLYCOL 3350 17 G PO PACK
17.0000 g | PACK | Freq: Every day | ORAL | Status: DC
  Administered 2017-12-26: 17 g via ORAL
  Filled 2017-12-26: qty 1

## 2017-12-26 MED ORDER — MIDAZOLAM HCL 2 MG/2ML IJ SOLN
INTRAMUSCULAR | Status: AC
  Filled 2017-12-26: qty 4

## 2017-12-26 MED ORDER — FENTANYL CITRATE (PF) 100 MCG/2ML IJ SOLN
INTRAMUSCULAR | Status: AC
  Filled 2017-12-26: qty 4

## 2017-12-26 MED ORDER — NALOXONE HCL 0.4 MG/ML IJ SOLN
INTRAMUSCULAR | Status: AC
  Filled 2017-12-26: qty 1

## 2017-12-26 MED ORDER — AMOXICILLIN-POT CLAVULANATE 875-125 MG PO TABS: 1.0000 | ORAL_TABLET | Freq: Two times a day (BID) | ORAL | 0 refills | Status: AC

## 2017-12-26 MED ORDER — LIDOCAINE HCL (PF) 1 % IJ SOLN
INTRAMUSCULAR | Status: AC | PRN
  Administered 2017-12-26: 10 mL via INTRADERMAL

## 2017-12-26 MED ORDER — SENNA 8.6 MG PO TABS: 2.0000 | ORAL_TABLET | Freq: Every day | ORAL | Status: AC

## 2017-12-26 NOTE — Progress Notes (Signed)
[name]   DOB:1933/01/02   NO#:[id number]   VQX#:[id number]  Subjective:  Patient is currently at [facility]. I met with her niece in the room  Objective: Marland Kitchen Vitals:   12/25/17 2052 12/26/17 0551  BP: (!) 145/60 123/83  Pulse: 76 (!) 58  Resp: 18 20  Temp: 99.2 F (37.3 C) 98.1 F (36.7 C)  SpO2: 96% 97%    Body mass index is 34.54 kg/m.  Intake/Output Summary (Last 24 hours) at 12/26/2017 0812 Last data filed at 12/26/2017 0552 Gross per 24 hour  Intake 1531.75 ml  Output 450 ml  Net 1081.75 ml    .  CBG (last 3)  Recent Labs    12/25/17 2053 12/26/17 0555 12/26/17 0738  GLUCAP 174* 134* 113*     Labs:  Lab Results  Component Value Date   WBC 72.9 (HH) 12/26/2017   HGB 11.2 (L) 12/26/2017   HCT 34.6 (L) 12/26/2017   MCV 96.4 12/26/2017   PLT 56 (L) 12/26/2017   NEUTROABS 2.2 12/26/2017    _0 @  Urine Studies No results for input(s): UHGB, CRYS in the last 72 hours.  Invalid input(s): UACOL, UAPR, USPG, UPH, UTP, UGL, UKET, UBIL, UNIT, UROB, ULEU, UEPI, UWBC, URBC, [place], CAST, [place], [place]  Basic Metabolic Panel: Recent Labs  Lab 12/24/17 1310 12/25/17 0945 12/26/17 0420  NA 136 136 134*  K 4.2 4.0 4.7  CL 103 102 104  CO2 20* 21* 22  GLUCOSE 136* 136* 159*  BUN 28* 26* 28*  CREATININE 1.25* 1.23* 1.25*  CALCIUM 9.1 9.1 8.6*   GFR Estimated Creatinine Clearance: 36 mL/min (A) (by C-G formula based on SCr of 1.25 mg/dL (H)). Liver Function Tests: Recent Labs  Lab 12/24/17 1310  AST 21  ALT 15  ALKPHOS 81  BILITOT 0.8  PROT 6.4*  ALBUMIN 3.2*   No results for input(s): LIPASE, AMYLASE in the last 168 hours. No results for input(s): AMMONIA in the last 168 hours. Coagulation profile Recent Labs  Lab 12/25/17 0945  INR 1.37    CBC: Recent Labs  Lab 12/24/17 1310 12/25/17 0945 12/26/17 0420  WBC 68.1* 67.4* 72.9*  NEUTROABS 12.9* 2.0 2.2  HGB 11.1* 11.5* 11.2*  HCT 33.9* 35.1* 34.6*  MCV 95.0 95.9 96.4  PLT 66*  63* 56*   Cardiac Enzymes: No results for input(s): CKTOTAL, CKMB, CKMBINDEX, TROPONINI in the last 168 hours. BNP: Invalid input(s): POCBNP CBG: Recent Labs  Lab 12/25/17 1316 12/25/17 1738 12/25/17 2053 12/26/17 0555 12/26/17 0738  GLUCAP 174* 157* 174* 134* 113*   D-Dimer No results for input(s): DDIMER in the last 72 hours. Hgb A1c No results for input(s): HGBA1C in the last 72 hours. Lipid Profile No results for input(s): CHOL, HDL, LDLCALC, TRIG, CHOLHDL, LDLDIRECT in the last 72 hours. Thyroid function studies No results for input(s): TSH, T4TOTAL, T3FREE, THYROIDAB in the last 72 hours.  Invalid input(s): FREET3 Anemia work up No results for input(s): VITAMINB12, FOLATE, FERRITIN, TIBC, IRON, RETICCTPCT in the last 72 hours. Microbiology No results found for this or any previous visit (from the past 240 hour(s)).  Uric acid 5.5, LDH is 450  Studies:  Dg Chest 2 View  Result Date: 12/24/2017 CLINICAL DATA:  Onset of chest pain this morning. EXAM: CHEST - 2 VIEW COMPARISON:  PA and lateral chest 12/18/2017 and 05/13/2015. FINDINGS: Lungs are clear. Heart size is upper normal. No pneumothorax or pleural effusion. The patient is status post CABG. Thoracic spondylosis noted. IMPRESSION: No acute  disease. Electronically Signed   By: Inge Rise M.D.   On: 12/24/2017 14:48   Ct Angio Chest/abd/pel For Dissection W And/or Wo Contrast  Result Date: 12/24/2017 CLINICAL DATA:  Patient experienced chest pain radiating to both arms and top of back at approx 0800. Pain 10/10. 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 80 ml of isovue 370 given. ^50m ISOVUE-370 IOPAMIDOL (ISOVUE-370) INJECTION 76% EXAM: CT ANGIOGRAPHY CHEST, ABDOMEN AND PELVIS TECHNIQUE: Multidetector CT imaging through the chest, abdomen and pelvis was performed using the standard protocol during bolus administration of intravenous contrast. Multiplanar reconstructed images and  MIPs were obtained and reviewed to evaluate the vascular anatomy. CONTRAST:  865mISOVUE-370 IOPAMIDOL (ISOVUE-370) INJECTION 76% COMPARISON:  Chest x-ray 12/18/2017 and CT abdomen and pelvis on 08/07/2017 FINDINGS: CTA CHEST FINDINGS Cardiovascular: There is significant three-vessel coronary artery disease. Heart size is normal. No pericardial effusion. There is atherosclerotic calcification of the thoracic aorta not associated with aneurysm or dissection. Mediastinum/Nodes: No mediastinal, hilar, or axillary adenopathy. The visualized portion of the thyroid gland has a normal appearance. Esophagus is normal in appearance. Small hiatal hernia. Median sternotomy. Lungs/Pleura: Lungs are clear. No pleural effusion or pneumothorax. Musculoskeletal: Mild degenerative changes are seen in the thoracic spine. No suspicious lytic or blastic lesions are identified. Review of the MIP images confirms the above findings. CTA ABDOMEN AND PELVIS FINDINGS VASCULAR Aorta: There is atherosclerotic calcification of the aorta. No aneurysm or dissection. Celiac: Significant atherosclerotic calcification at the origin of the celiac axis and significant narrowing 1.4 centimeters from the origin. There is reconstituted flow distal to the stenosis. SMA: Significant atherosclerotic calcification at the origin with mild narrowing. Renals: There is atherosclerotic calcification of the single renal arteries bilaterally. No significant stenosis. IMA: Patent without evidence of aneurysm, dissection, vasculitis or significant stenosis. Inflow: Unremarkable. Veins: No obvious venous abnormality within the limitations of this arterial phase study. Review of the MIP images confirms the above findings. NON-VASCULAR Hepatobiliary: The gallbladder is distended and contains numerous calcified stones. The liver is homogeneous Pancreas: Unremarkable. No pancreatic ductal dilatation or surrounding inflammatory changes. Spleen: Normal in size without focal  abnormality. Adrenals/Urinary Tract: Adrenal glands are normal in appearance. No renal mass or hydronephrosis. There is irregular thickening of the urinary bladder wall. Small amount of air is identified within the bladder, raising question of recent catheterization or infection. Stomach/Bowel: Small hiatal hernia. Stomach is otherwise normal in appearance. Small bowel loops are normal in appearance. There is significant diverticular disease of the entire colon, particularly involving the sigmoid segments. Surrounding the sigmoid colon, there are inflammatory changes and punctate locules of gas consistent with micro perforation. No abscess Lymphatic: No retroperitoneal or mesenteric adenopathy. Reproductive: Uterus is absent.  No adnexal mass. Other: No free pelvic fluid. Anterior abdominal wall is unremarkable. Musculoskeletal: Degenerative changes are identified in LOWER thoracic and lumbar spine. Review of the MIP images confirms the above findings. IMPRESSION: 1. Acute sigmoid diverticulitis with evidence for microperforation. There is no associated abscess. 2. Significant atherosclerotic disease of the thoracic and abdominal aorta. No aneurysm or dissection. Aortic atherosclerosis. (ICD10-I70.0) 3. Three-vessel coronary artery disease. 4. Small hiatal hernia. 5. Atherosclerosis of the celiac, SMA, renal arteries, and IMA. No evidence for occlusion. There is tight stenosis of the proximal celiac axis with distal reconstitution. 6. Cholelithiasis. 7. Irregularly thickened urinary bladder wall and air within the urinary bladder, consistent with urinary tract infection. This may be secondary process related to the sigmoid  diverticulitis. 8. Hysterectomy. Electronically Signed   By: Nolon Nations M.D.   On: 12/24/2017 17:59    Assessment: 84 y.o. [place] woman with a remote history of breast cancer, never requiring chemotherapy, now admitted with uncontrolled pain and with a white cell count of 68.1, mostly  blasts, with mild anemia and moderate thrombocytopenia   (1)  status post left lumpectomy and sentinel lymph node sampling 11/21/2011 for a T1c N0, stage IA invasive ductal carcinoma, grade 3, strongly estrogen and progesterone receptor positive, with an MIB-1 of 47%, and no HER-2 amplification.   (2)  Completed radiation 01/10/2012 and started anastrozole June 2013, stopped August 2018  (3)  osteoporosis, receiving zoledronic acid annually, first dose given on 05/24/2012, last dose 05/13/2015             (a) Bone density 12/19/2016 finds the T score at -2.6 (was -2.8 on 04/29/2014  (4) acute leukemia: White cell count of 68.1 on 12/24/2017, review of the blood film showing mostly blasts, not clearly myeloid  (a) note white cell count of 7.0 four months ago, with hemoglobin then 14.4 and platelets 245,000     Plan:  Bone marrow biopsy is being done this AM. We will have results late today or at latest tomorrow.  I have written for an echo: if patient's cardiac function is not WNL her treatment options may be limited. Hopefully that can get done today.  I have set up an appointment with Dr Jerrye Noble at [facility] this Friday 04/26 at 1:45 PM for patient to discuss treatment options. I will make sure all her results are available to Dr Florene Glen by then.  Patient should be discharged on allopurinol 300 mg po daily and hydrea 1000 mg po daily. Pain meds are at your discretion. She will need bowel prophylaxis (miralax daily, sonokot 2 tabs BID, laxative PRN)  I will arrange for follow up with me as appropriate.  If pain controlled and echo done may d/c at your discretion. Please let me know if I can  be of further help at this point.   Chauncey Cruel, MD 12/26/2017  8:12 AM Medical Oncology and Hematology [facility] [address] [place],  [address] Tel. [phone]    Fax. [phone]

## 2017-12-26 NOTE — Progress Notes (Signed)
CRITICAL VALUE ALERT  Critical Value: WBC 72.9  Date & Time Notified: 12/26/2017  3794  Provider Notified: X. Blount. NP  Orders Received/Actions Taken: Provider to assess and address.

## 2017-12-26 NOTE — Procedures (Signed)
Interventional Radiology Procedure Note  Procedure: CT guided bone marrow aspiration and biopsy  Complications: None  EBL: < 10 mL  Findings: Aspirate and core biopsy performed of bone marrow in right iliac bone.  Plan: Bedrest supine x 1 hrs  Chyrel Taha T. Rennie Rouch, M.D Pager:  [phone]   

## 2017-12-26 NOTE — Discharge Summary (Signed)
Physician Discharge Summary  Katherine OBI AYT:[id number] DOB: Jun 09, 1933 DOA: 12/24/2017  PCP: Lorne Skeens, MD  Admit date: 12/24/2017 Discharge date: 12/26/2017  Recommendations for Outpatient Follow-up:   Suspected acute leukemia with marked leukocytosis, with associated bilateral shoulder pain and upper back pain.    Dehydration. --BMP stable with modest elevation of creatinine.  Consider repeat BMP as an outpatient.  Acute sigmoid diverticulitis with inflammatory changes and punctate locules of gas consistent with microperforation.  No abscess. --Remains completely asymptomatic but has had bouts of this in the past.  Reviewed case, CT with general surgery Dr. Hassell Done.  Recommend conservative management with oral antibiotics.  Note was made of bladder abnormality and patient should be monitored for development of a colovesical fistula although there is no evidence of this at this point.    Follow-up Information    Brantley Fling, MD Follow up on 12/29/2017.   Specialty:  Internal Medicine Why:  1:45 PM Contact information: [place]  [id number] [phone]            Discharge Diagnoses:  1. Suspected acute leukemia with marked leukocytosis, with associated bilateral shoulder pain and upper back pain.  2. Dehydration. 3. Acute sigmoid diverticulitis with inflammatory changes and punctate locules of gas consistent with microperforation 4. Thrombocytopenia  5. Diabetes mellitus type 2 6. Aortic atherosclerosis  Discharge Condition: improved Disposition: home  Diet recommendation: Heart healthy diabetic diet  Filed Weights   12/24/17 1256  Weight: 88.5 kg (195 lb)    History of present illness:  84 year old woman PMH breast cancer, presented with sudden onset of bilateral shoulder and upper back pain.  She was found to have marked leukocytosis and was admitted for further evaluation of suspected leukemia.  Hospital Course:    She was treated with supportive care, seen by oncology, underwent bone marrow biopsy with pathology and flow cytometry pending.  Echocardiogram obtained with results below.  Pain was adequately controlled with oral analgesics.  Close outpatient follow-up was arranged at [facility] in 3 days.  Individual issues as below.  Suspected acute leukemia with marked leukocytosis, with associated bilateral shoulder pain and upper back pain.  CT negative for dissection. --Oncology followed with the following recommendations  --Bone marrow biopsy done 4/23 with flow cytometry --Echocardiogram results below --Has follow-up this Friday at [facility] --Per oncology: Allopurinol 300 mg po daily and hydrea 1000 mg po daily. --Lortab, bowel regimen  Dehydration. --BMP stable.  Creatinine stable.  Follow-up as an outpatient.  CT suggested acute sigmoid diverticulitis with inflammatory changes and punctate locules of gas consistent with microperforation.  No abscess. --Remains completely asymptomatic but has had bouts of this in the past.  Reviewed case, CT with general surgery Dr. Hassell Done.  Recommend conservative management with oral antibiotics.  Note was made of bladder abnormality and patient should be monitored for development of a colovesical fistula although there is no evidence of this at this point.  Thrombocytopenia secondary to suspected leukemia --Stable.  Diabetes mellitus type 2 --Blood sugars stable  Aortic atherosclerosis.  Atherosclerosis of the celiac, SMA, renal arteries, IMA.  No evidence of occlusion.  Three-vessel coronary disease by CT. --Asymptomatic.  Consultants:  Oncology  Procedures:  Echo Study Conclusions  - Left ventricle: The cavity size was normal. There was mild focal   basal hypertrophy of the septum. Systolic function was normal.   The estimated ejection fraction was in the range of 55% to 60%.   Wall motion was normal; there  were no regional wall  motion   abnormalities. Doppler parameters are consistent with abnormal   left ventricular relaxation (grade 1 diastolic dysfunction). - Mitral valve: Calcified annulus. There was mild regurgitation. - Left atrium: The atrium was mildly dilated. - Tricuspid valve: There was mild-moderate regurgitation. - Pulmonary arteries: PA peak pressure: 53 mm Hg (S).  Impressions:  - Normal LV systolic function; mild diastolic dysfunction; mild MR;   mild LAE; mild TR with moderate pulmonary hypertension; global   longitudinal strain abnormal (- 13.4%).  Antimicrobials:  Augmentin 4/22 >> May 5  Today's assessment: S: Overall feels better today.  Pain relatively well controlled.  Had an episode of nausea and vomiting today.  No abdominal pain. O: Vitals:  Vitals:   12/26/17 0848 12/26/17 0901  BP: (!) 156/59 (!) 154/49  Pulse: 77 73  Resp: 19 18  Temp:    SpO2: 97% 95%    Constitutional:  . Appears calm and comfortable Respiratory:  . CTA bilaterally, no w/r/r.  . Respiratory effort normal. Cardiovascular:  . RRR, no m/r/g Abdomen:  . Soft, nontender, nondistended Psychiatric:  . judgement and insight appear normal . Mental status o Mood, affect appropriate    Discharge Instructions  Discharge Instructions    Diet - low sodium heart healthy   Complete by:  As directed    Diet Carb Modified   Complete by:  As directed    Discharge instructions   Complete by:  As directed    Call your physician or seek immediate medical attention for pain, fever, confusion, sleepiness, shortness of breath, vomiting, abdominal pain or worsening of condition.   Increase activity slowly   Complete by:  As directed      Allergies as of 12/26/2017   No Known Allergies     Medication List    STOP taking these medications   acetaminophen 500 MG tablet Commonly known as:  TYLENOL   ibuprofen 200 MG tablet Commonly known as:  ADVIL,MOTRIN     TAKE these medications   allopurinol  300 MG tablet Commonly known as:  ZYLOPRIM Take 1 tablet (300 mg total) by mouth daily. Start taking on:  12/27/2017   amoxicillin-clavulanate 875-125 MG tablet Commonly known as:  AUGMENTIN Take 1 tablet by mouth every 12 (twelve) hours.   aspirin 81 MG tablet Take 81 mg by mouth every evening.   benzonatate 200 MG capsule Commonly known as:  TESSALON Take 1 capsule (200 mg total) by mouth every 8 (eight) hours. What changed:    when to take this  reasons to take this   BIOTIN FORTE PO Take 1 tablet by mouth daily.   calcium carbonate 1250 (500 Ca) MG tablet Commonly known as:  OS-CAL - dosed in mg of elemental calcium Take 1 tablet by mouth at bedtime.   CRESTOR 20 MG tablet Generic drug:  rosuvastatin Take 20 mg by mouth every evening.   diazepam 5 MG tablet Commonly known as:  VALIUM Take 0.5-1 tablets (2.5-5 mg total) by mouth every 6 (six) hours as needed for muscle spasms or sedation. What changed:  how much to take   esomeprazole 40 MG capsule Commonly known as:  NEXIUM Take 40 mg by mouth daily before breakfast.   EYE VITAMINS Caps Take 1 capsule by mouth 2 (two) times daily.   ezetimibe 10 MG tablet Commonly known as:  ZETIA Take 10 mg by mouth every evening.   fluticasone 50 MCG/ACT nasal spray Commonly known as:  [place]  1 spray into both nostrils daily for 7 days.   furosemide 40 MG tablet Commonly known as:  LASIX TAKE 1 TABLET DAILY   gabapentin 100 MG capsule Commonly known as:  NEURONTIN TAKE 1 CAPSULE AT BEDTIME  AS NEEDED   HYDROcodone-acetaminophen 5-325 MG tablet Commonly known as:  NORCO/VICODIN Take 1-2 tablets by mouth every 6 (six) hours as needed for up to 5 days for moderate pain or severe pain.   hydroxyurea 500 MG capsule Commonly known as:  HYDREA Take 2 capsules (1,000 mg total) by mouth daily. May take with food to minimize GI side effects. Start taking on:  12/27/2017   INVOKANA 300 MG Tabs tablet Generic drug:   canagliflozin Take 300 mg by mouth daily before breakfast.   JARDIANCE 25 MG Tabs tablet Generic drug:  empagliflozin Take 25 mg by mouth daily.   levothyroxine 137 MCG tablet Commonly known as:  SYNTHROID, LEVOTHROID Take 137 mcg by mouth daily before breakfast.   losartan 50 MG tablet Commonly known as:  COZAAR Take 1 tablet (50 mg total) by mouth 2 (two) times daily.   metoprolol tartrate 50 MG tablet Commonly known as:  LOPRESSOR Take 1.5 tablets (75 mg total) by mouth 2 (two) times daily. Please keep 07/01/16 appt for further refills   multivitamin capsule Take 1 capsule by mouth every evening.   mupirocin ointment 2 % Commonly known as:  BACTROBAN Apply 1 application topically 2 (two) times daily. What changed:    when to take this  reasons to take this   ondansetron 4 MG tablet Commonly known as:  ZOFRAN Take 1 tablet (4 mg total) by mouth every 6 (six) hours as needed for nausea.   pioglitazone 15 MG tablet Commonly known as:  ACTOS Take 15 mg by mouth daily.   polyethylene glycol packet Commonly known as:  MIRALAX / GLYCOLAX Take 17 g by mouth 2 (two) times daily.   potassium chloride 10 MEQ tablet Commonly known as:  KLOR-CON M10 Take 1 tablet (10 mEq total) by mouth daily.   rOPINIRole 3 MG tablet Commonly known as:  REQUIP Take 3 mg by mouth at bedtime.   senna 8.6 MG Tabs tablet Commonly known as:  SENOKOT Take 2 tablets (17.2 mg total) by mouth at bedtime.   Vitamin D-3 5000 units Tabs Take 1 tablet by mouth daily.      No Known Allergies  The results of significant diagnostics from this hospitalization (including imaging, microbiology, ancillary and laboratory) are listed below for reference.    Significant Diagnostic Studies: Dg Chest 2 View  Result Date: 12/24/2017 CLINICAL DATA:  Onset of chest pain this morning. EXAM: CHEST - 2 VIEW COMPARISON:  PA and lateral chest 12/18/2017 and 05/13/2015. FINDINGS: Lungs are clear. Heart size  is upper normal. No pneumothorax or pleural effusion. The patient is status post CABG. Thoracic spondylosis noted. IMPRESSION: No acute disease. Electronically Signed   By: Inge Rise M.D.   On: 12/24/2017 14:48   Dg Chest 2 View  Result Date: 12/18/2017 CLINICAL DATA:  Chest pain. EXAM: CHEST - 2 VIEW COMPARISON:  Radiographs of May 13, 2015. FINDINGS: Stable cardiomediastinal silhouette. Status post coronary artery bypass graft. Atherosclerosis of thoracic aorta is noted. No pneumothorax or pleural effusion is noted. Both lungs are clear. The visualized skeletal structures are unremarkable. IMPRESSION: No active cardiopulmonary disease. Aortic Atherosclerosis (ICD10-I70.0). Electronically Signed   By: Marijo Conception, M.D.   On: 12/18/2017 12:18   Ct Biopsy  Result Date: 12/26/2017 CLINICAL DATA:  Leukocytosis with peripheral blasts and suspicion of acute leukemia. EXAM: CT GUIDED BONE MARROW ASPIRATION AND BIOPSY ANESTHESIA/SEDATION: Versed 1.0 mg IV, Fentanyl 50 mcg IV Total Moderate Sedation Time:   15 minutes. The patient's level of consciousness and physiologic status were continuously monitored during the procedure by Radiology nursing. PROCEDURE: The procedure risks, benefits, and alternatives were explained to the patient. Questions regarding the procedure were encouraged and answered. The patient understands and consents to the procedure. A time out was performed prior to initiating the procedure. The right gluteal region was prepped with chlorhexidine. Sterile gown and sterile gloves were used for the procedure. Local anesthesia was provided with 1% Lidocaine. Under CT guidance, an 11 gauge On Control bone cutting needle was advanced from a posterior approach into the right iliac bone. Needle positioning was confirmed with CT. Initial non heparinized and heparinized aspirate samples were obtained of bone marrow. Core biopsy was performed via the On Control drill needle. COMPLICATIONS:  None FINDINGS: Inspection of initial aspirate did reveal visible particles. Intact core biopsy sample was obtained. IMPRESSION: CT guided bone marrow biopsy of right posterior iliac bone with both aspirate and core samples obtained. Electronically Signed   By: Aletta Edouard M.D.   On: 12/26/2017 09:24   Ct Bone Marrow Biopsy & Aspiration  Result Date: 12/26/2017 CLINICAL DATA:  Leukocytosis with peripheral blasts and suspicion of acute leukemia. EXAM: CT GUIDED BONE MARROW ASPIRATION AND BIOPSY ANESTHESIA/SEDATION: Versed 1.0 mg IV, Fentanyl 50 mcg IV Total Moderate Sedation Time:   15 minutes. The patient's level of consciousness and physiologic status were continuously monitored during the procedure by Radiology nursing. PROCEDURE: The procedure risks, benefits, and alternatives were explained to the patient. Questions regarding the procedure were encouraged and answered. The patient understands and consents to the procedure. A time out was performed prior to initiating the procedure. The right gluteal region was prepped with chlorhexidine. Sterile gown and sterile gloves were used for the procedure. Local anesthesia was provided with 1% Lidocaine. Under CT guidance, an 11 gauge On Control bone cutting needle was advanced from a posterior approach into the right iliac bone. Needle positioning was confirmed with CT. Initial non heparinized and heparinized aspirate samples were obtained of bone marrow. Core biopsy was performed via the On Control drill needle. COMPLICATIONS: None FINDINGS: Inspection of initial aspirate did reveal visible particles. Intact core biopsy sample was obtained. IMPRESSION: CT guided bone marrow biopsy of right posterior iliac bone with both aspirate and core samples obtained. Electronically Signed   By: Aletta Edouard M.D.   On: 12/26/2017 09:24   Ct Angio Chest/abd/pel For Dissection W And/or Wo Contrast  Result Date: 12/24/2017 CLINICAL DATA:  Patient experienced chest pain  radiating to both arms and top of back at approx 0800. Pain 10/10. 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 80 ml of isovue 370 given. ^38m ISOVUE-370 IOPAMIDOL (ISOVUE-370) INJECTION 76% EXAM: CT ANGIOGRAPHY CHEST, ABDOMEN AND PELVIS TECHNIQUE: Multidetector CT imaging through the chest, abdomen and pelvis was performed using the standard protocol during bolus administration of intravenous contrast. Multiplanar reconstructed images and MIPs were obtained and reviewed to evaluate the vascular anatomy. CONTRAST:  87mISOVUE-370 IOPAMIDOL (ISOVUE-370) INJECTION 76% COMPARISON:  Chest x-ray 12/18/2017 and CT abdomen and pelvis on 08/07/2017 FINDINGS: CTA CHEST FINDINGS Cardiovascular: There is significant three-vessel coronary artery disease. Heart size is normal. No pericardial effusion. There is atherosclerotic calcification of the thoracic aorta  not associated with aneurysm or dissection. Mediastinum/Nodes: No mediastinal, hilar, or axillary adenopathy. The visualized portion of the thyroid gland has a normal appearance. Esophagus is normal in appearance. Small hiatal hernia. Median sternotomy. Lungs/Pleura: Lungs are clear. No pleural effusion or pneumothorax. Musculoskeletal: Mild degenerative changes are seen in the thoracic spine. No suspicious lytic or blastic lesions are identified. Review of the MIP images confirms the above findings. CTA ABDOMEN AND PELVIS FINDINGS VASCULAR Aorta: There is atherosclerotic calcification of the aorta. No aneurysm or dissection. Celiac: Significant atherosclerotic calcification at the origin of the celiac axis and significant narrowing 1.4 centimeters from the origin. There is reconstituted flow distal to the stenosis. SMA: Significant atherosclerotic calcification at the origin with mild narrowing. Renals: There is atherosclerotic calcification of the single renal arteries bilaterally. No significant stenosis. IMA: Patent without evidence of  aneurysm, dissection, vasculitis or significant stenosis. Inflow: Unremarkable. Veins: No obvious venous abnormality within the limitations of this arterial phase study. Review of the MIP images confirms the above findings. NON-VASCULAR Hepatobiliary: The gallbladder is distended and contains numerous calcified stones. The liver is homogeneous Pancreas: Unremarkable. No pancreatic ductal dilatation or surrounding inflammatory changes. Spleen: Normal in size without focal abnormality. Adrenals/Urinary Tract: Adrenal glands are normal in appearance. No renal mass or hydronephrosis. There is irregular thickening of the urinary bladder wall. Small amount of air is identified within the bladder, raising question of recent catheterization or infection. Stomach/Bowel: Small hiatal hernia. Stomach is otherwise normal in appearance. Small bowel loops are normal in appearance. There is significant diverticular disease of the entire colon, particularly involving the sigmoid segments. Surrounding the sigmoid colon, there are inflammatory changes and punctate locules of gas consistent with micro perforation. No abscess Lymphatic: No retroperitoneal or mesenteric adenopathy. Reproductive: Uterus is absent.  No adnexal mass. Other: No free pelvic fluid. Anterior abdominal wall is unremarkable. Musculoskeletal: Degenerative changes are identified in LOWER thoracic and lumbar spine. Review of the MIP images confirms the above findings. IMPRESSION: 1. Acute sigmoid diverticulitis with evidence for microperforation. There is no associated abscess. 2. Significant atherosclerotic disease of the thoracic and abdominal aorta. No aneurysm or dissection. Aortic atherosclerosis. (ICD10-I70.0) 3. Three-vessel coronary artery disease. 4. Small hiatal hernia. 5. Atherosclerosis of the celiac, SMA, renal arteries, and IMA. No evidence for occlusion. There is tight stenosis of the proximal celiac axis with distal reconstitution. 6.  Cholelithiasis. 7. Irregularly thickened urinary bladder wall and air within the urinary bladder, consistent with urinary tract infection. This may be secondary process related to the sigmoid diverticulitis. 8. Hysterectomy. Electronically Signed   By: Nolon Nations M.D.   On: 12/24/2017 17:59   Labs: Basic Metabolic Panel: Recent Labs  Lab 12/24/17 1310 12/25/17 0945 12/26/17 0420  NA 136 136 134*  K 4.2 4.0 4.7  CL 103 102 104  CO2 20* 21* 22  GLUCOSE 136* 136* 159*  BUN 28* 26* 28*  CREATININE 1.25* 1.23* 1.25*  CALCIUM 9.1 9.1 8.6*   Liver Function Tests: Recent Labs  Lab 12/24/17 1310  AST 21  ALT 15  ALKPHOS 81  BILITOT 0.8  PROT 6.4*  ALBUMIN 3.2*   CBC: Recent Labs  Lab 12/24/17 1310 12/25/17 0945 12/26/17 0420  WBC 68.1* 67.4* 72.9*  NEUTROABS 12.9* 2.0 2.2  HGB 11.1* 11.5* 11.2*  HCT 33.9* 35.1* 34.6*  MCV 95.0 95.9 96.4  PLT 66* 63* 56*    Recent Labs    12/24/17 1313  BNP 184.2*    CBG: Recent Labs  Lab  12/25/17 1738 12/25/17 2053 12/26/17 0555 12/26/17 0738 12/26/17 1225  GLUCAP 157* 174* 134* 113* 120*    Active Problems:   Acute leukemia (HCC)   Dehydration   Acute diverticulitis   Thrombocytopenia (HCC)   Aortic atherosclerosis ([place])   Time coordinating discharge: 35 minutes  Signed:  Murray Hodgkins, MD [facility] 12/26/2017, 3:54 PM

## 2017-12-26 NOTE — Progress Notes (Signed)
  Echocardiogram 2D Echocardiogram has been performed.  [name] 12/26/2017, 12:19 PM

## 2017-12-28 ENCOUNTER — Other Ambulatory Visit: Payer: Self-pay | Admitting: Oncology

## 2017-12-28 DIAGNOSIS — C9200 Acute myeloblastic leukemia, not having achieved remission: Secondary | ICD-10-CM | POA: Insufficient documentation

## 2017-12-28 NOTE — Progress Notes (Unsigned)
I called Ms. Neighbors and gave her the results of her bone marrow biopsy which showed no acute myeloid leukemia.  Her echocardiogram is favorable.  She is seeing Dr. Florene Glen tomorrow and we are faxing all those results to him today.  I have tentatively made her a return appointment here for oh 5/17 but she will let me know if I need to see her before or cancel that appointment.

## 2017-12-29 ENCOUNTER — Other Ambulatory Visit: Payer: Self-pay | Admitting: Oncology

## 2017-12-29 MED ORDER — HYDROCODONE-ACETAMINOPHEN 5-325 MG PO TABS: 1.0000 | ORAL_TABLET | ORAL | 0 refills | Status: AC | PRN

## 2017-12-31 ENCOUNTER — Encounter: Payer: Self-pay | Admitting: Oncology

## 2018-01-01 ENCOUNTER — Other Ambulatory Visit: Payer: Self-pay | Admitting: Oncology

## 2018-01-01 LAB — BCR-ABL1 KINASE DOMAIN MUTATION ANALYSIS

## 2018-01-01 MED ORDER — ROPINIROLE HCL 1 MG PO TABS
1.00 | ORAL_TABLET | ORAL | Status: DC
Start: 2018-01-01 — End: 2018-01-01

## 2018-01-01 MED ORDER — SODIUM CHLORIDE 0.9 % IJ SOLN
20.00 | INTRAMUSCULAR | Status: DC
Start: 2018-01-01 — End: 2018-01-01

## 2018-01-01 MED ORDER — MELATONIN 3 MG PO TABS
3.00 | ORAL_TABLET | ORAL | Status: DC
Start: 2018-01-01 — End: 2018-01-01

## 2018-01-01 MED ORDER — GENERIC EXTERNAL MEDICATION
137.00 | Status: DC
Start: 2018-01-02 — End: 2018-01-01

## 2018-01-01 MED ORDER — BENZONATATE 100 MG PO CAPS
100.00 | ORAL_CAPSULE | ORAL | Status: DC
Start: ? — End: 2018-01-01

## 2018-01-01 MED ORDER — GENERIC EXTERNAL MEDICATION
625.00 | Status: DC
Start: 2018-01-01 — End: 2018-01-01

## 2018-01-01 MED ORDER — GENERIC EXTERNAL MEDICATION
3.38 g | Status: DC
Start: 2018-01-01 — End: 2018-01-01

## 2018-01-01 MED ORDER — METOPROLOL TARTRATE 25 MG PO TABS
50.00 | ORAL_TABLET | ORAL | Status: DC
Start: 2018-01-01 — End: 2018-01-01

## 2018-01-01 MED ORDER — ALBUTEROL SULFATE (2.5 MG/3ML) 0.083% IN NEBU
2.50 | INHALATION_SOLUTION | RESPIRATORY_TRACT | Status: DC
Start: ? — End: 2018-01-01

## 2018-01-01 MED ORDER — PANTOPRAZOLE SODIUM 40 MG PO TBEC
40.00 | DELAYED_RELEASE_TABLET | ORAL | Status: DC
Start: 2018-01-01 — End: 2018-01-01

## 2018-01-01 MED ORDER — SODIUM CHLORIDE 0.9 % IJ SOLN
20.00 | INTRAMUSCULAR | Status: DC
Start: ? — End: 2018-01-01

## 2018-01-01 MED ORDER — INSULIN LISPRO 100 UNIT/ML ~~LOC~~ SOLN
2.00 | SUBCUTANEOUS | Status: DC
Start: 2018-01-01 — End: 2018-01-01

## 2018-01-01 MED ORDER — HYDROXYUREA 500 MG PO CAPS
2000.00 | ORAL_CAPSULE | ORAL | Status: DC
Start: 2018-01-01 — End: 2018-01-01

## 2018-01-01 MED ORDER — DEXTROSE 10 % IV SOLN
125.00 | INTRAVENOUS | Status: DC
Start: ? — End: 2018-01-01

## 2018-01-01 MED ORDER — SODIUM CHLORIDE 0.9 % IV SOLN
INTRAVENOUS | Status: DC
Start: ? — End: 2018-01-01

## 2018-01-01 MED ORDER — FIBER (GUAR GUM) PO
1.00 | ORAL | Status: DC
Start: 2018-01-01 — End: 2018-01-01

## 2018-01-01 MED ORDER — ROSUVASTATIN CALCIUM 20 MG PO TABS
20.00 | ORAL_TABLET | ORAL | Status: DC
Start: 2018-01-01 — End: 2018-01-01

## 2018-01-01 MED ORDER — TRAMADOL HCL 50 MG PO TABS
50.00 | ORAL_TABLET | ORAL | Status: DC
Start: ? — End: 2018-01-01

## 2018-01-01 MED ORDER — GABAPENTIN 100 MG PO CAPS
100.00 | ORAL_CAPSULE | ORAL | Status: DC
Start: ? — End: 2018-01-01

## 2018-01-01 MED ORDER — ALLOPURINOL 300 MG PO TABS
300.00 | ORAL_TABLET | ORAL | Status: DC
Start: 2018-01-01 — End: 2018-01-01

## 2018-01-09 ENCOUNTER — Encounter (HOSPITAL_COMMUNITY): Payer: Self-pay | Admitting: Oncology

## 2018-01-11 LAB — TISSUE HYBRIDIZATION (BONE MARROW)-NCBH

## 2018-01-11 LAB — CHROMOSOME ANALYSIS, BONE MARROW

## 2018-01-16 ENCOUNTER — Other Ambulatory Visit: Payer: Self-pay | Admitting: Oncology

## 2018-01-16 NOTE — Progress Notes (Signed)
I received notification today that the patient had been admitted to hospice.  I called the family and the son tells me [name] died this morning peacefully at home under the care of hospice.

## 2018-01-17 ENCOUNTER — Telehealth: Payer: Self-pay | Admitting: *Deleted

## 2018-01-17 NOTE — Telephone Encounter (Signed)
Received vm call from Carla/Hospice/[place] that pt expired 01/04/2018 @ 3:20 am.  Call back # is [phone].  Message routed to Dr Magrinat/Pod RN & HIM/Kim Hegarty.

## 2018-01-19 ENCOUNTER — Ambulatory Visit: Payer: Medicare Other | Admitting: Oncology

## 2018-01-19 ENCOUNTER — Other Ambulatory Visit: Payer: Medicare Other

## 2018-02-03 DEATH — deceased

## 2018-11-29 ENCOUNTER — Ambulatory Visit: Payer: Medicare Other | Admitting: Nurse Practitioner

## 2019-04-09 IMAGING — CT CT ANGIO CHEST-ABD-PELV FOR DISSECTION W/ AND WO/W CM
2 of 7 series · 13 of 46 positions shown, 15 images · IV contrast (OMNI 350)
Comparison: Chest x-ray 12/18/2017 and CT abdomen and pelvis on
08/07/2017

CLINICAL DATA: Patient experienced chest pain radiating to both
arms and top of back at approx 8888. Pain [DATE]. Patient went to
teach [REDACTED] school with pain at a 10 then returned home. 80 ml of
isovue 370 given. ^80mL TA6I6P-X3K IOPAMIDOL (TA6I6P-X3K)
INJECTION 76%

EXAM:
CT ANGIOGRAPHY CHEST, ABDOMEN AND PELVIS
TECHNIQUE: Multidetector CT imaging through the chest, abdomen and pelvis was
performed using the standard protocol during bolus administration of
intravenous contrast. Multiplanar reconstructed images and MIPs were
obtained and reviewed to evaluate the vascular anatomy.
CONTRAST:  80mL TA6I6P-X3K IOPAMIDOL (TA6I6P-X3K) INJECTION 76%

[Series 7: dissection 2mm · axial · 0.68mm/px · z∈[+791,+1327]mm · 10 of 300 slices shown, 12 images]
[im 16/300  soft-tissue]
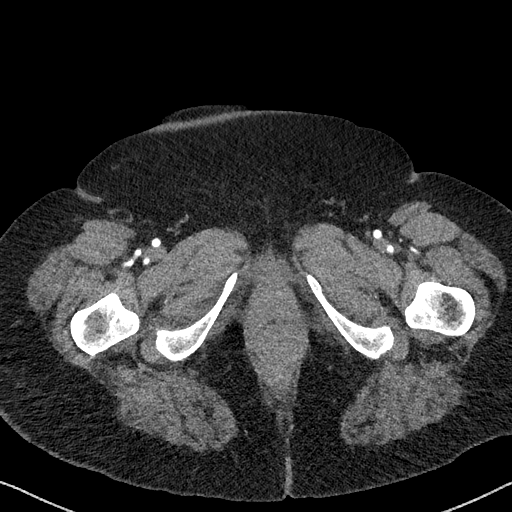
[im 16/300  bone]
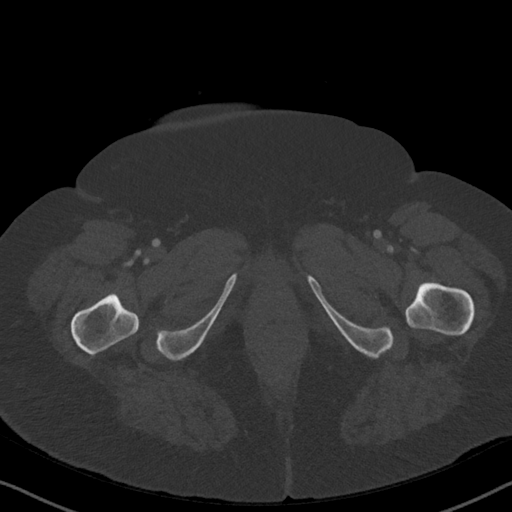
[im 48/300  soft-tissue]
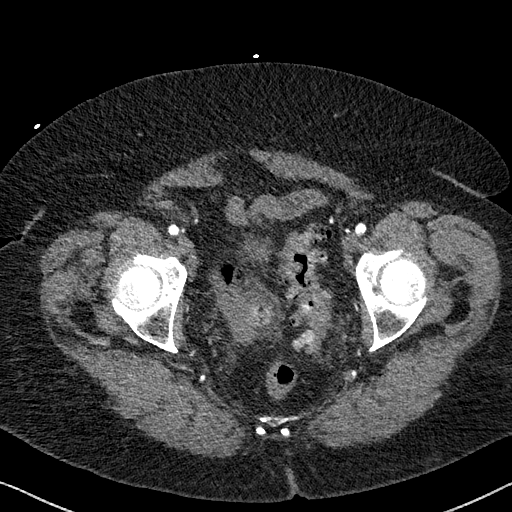
[im 79/300  soft-tissue]
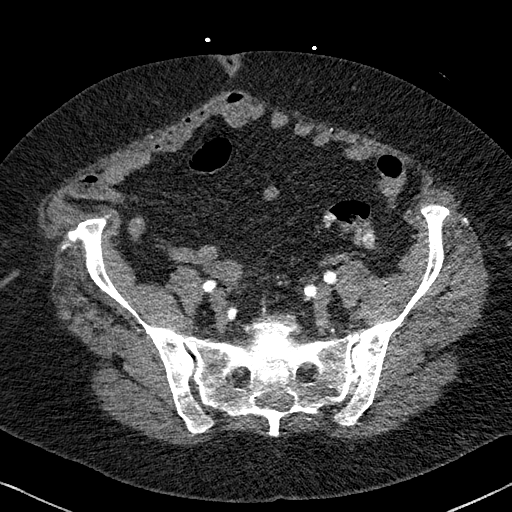
[im 111/300  soft-tissue]
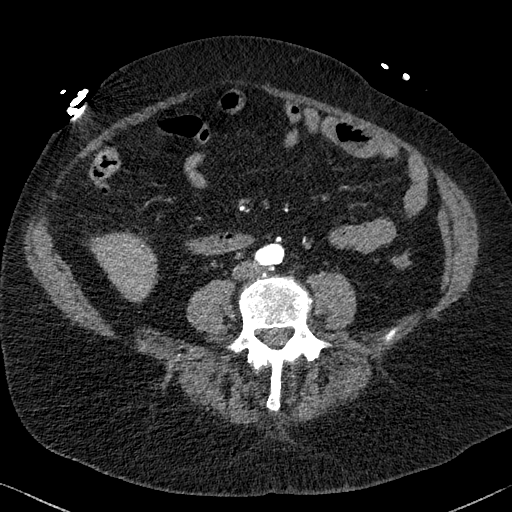
[im 142/300  soft-tissue]
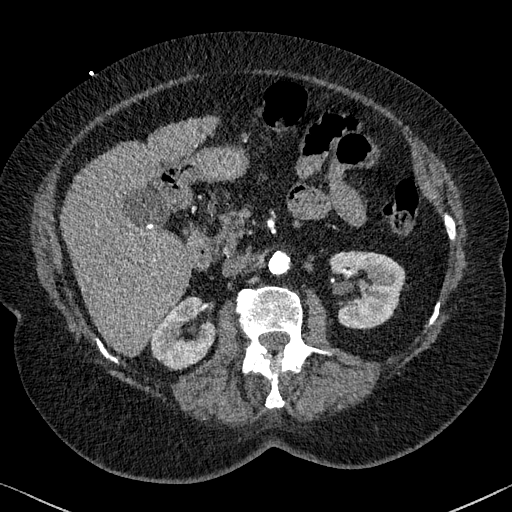
[im 158/300  soft-tissue]
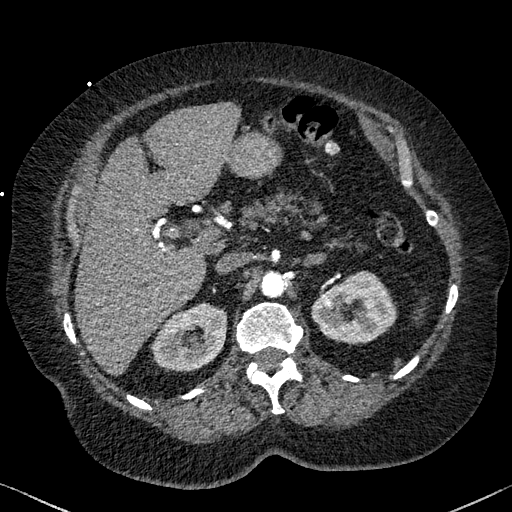
[im 189/300  soft-tissue]
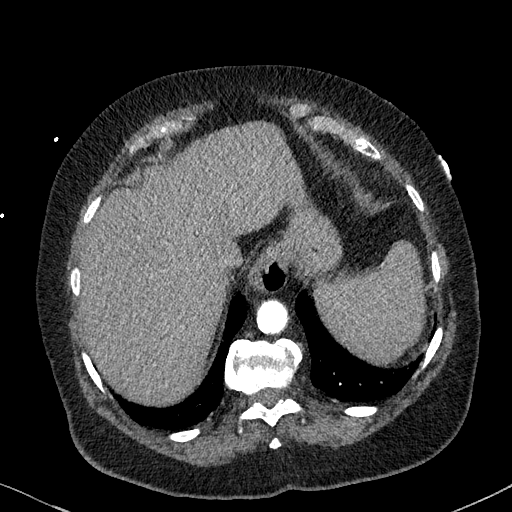
[im 221/300  soft-tissue]
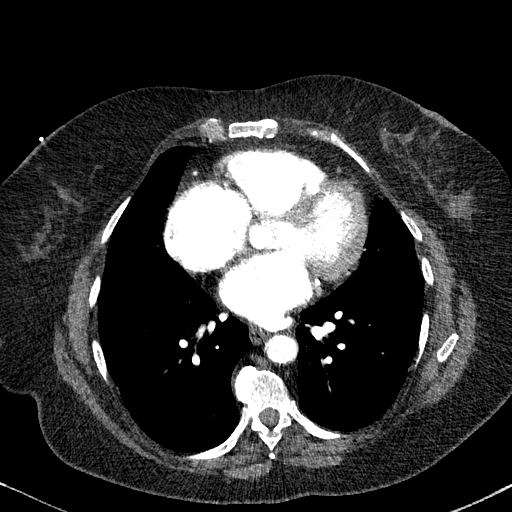
[im 252/300  soft-tissue]
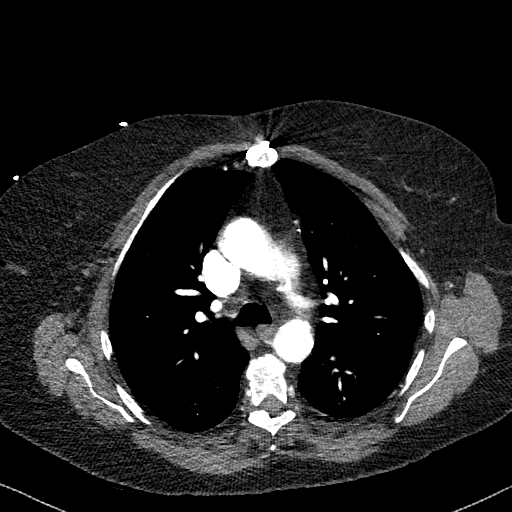
[im 252/300  bone]
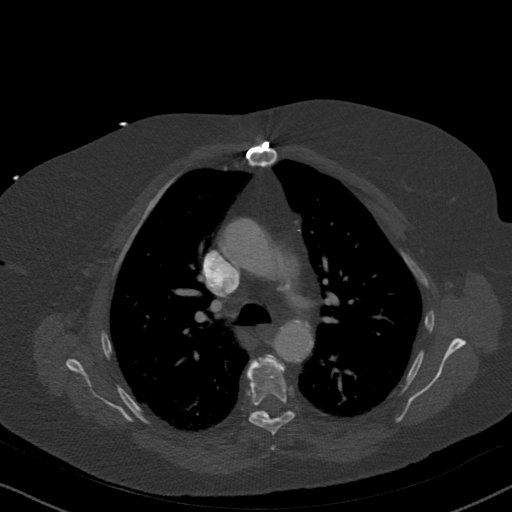
[im 284/300  soft-tissue]
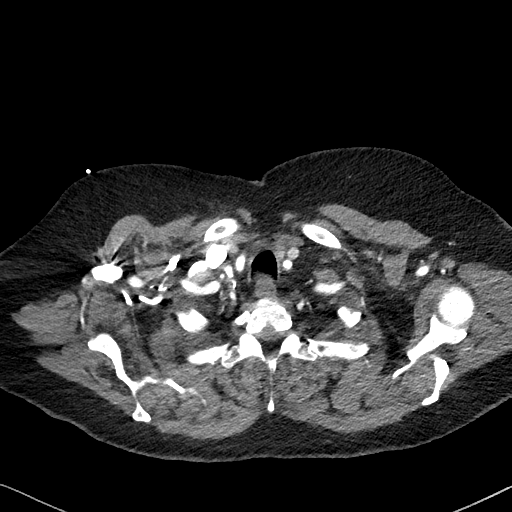

[Series 10: dissection 2mm cor · coronal · 0.71mm/px · 3 of 146 slices shown]
[im 37/146  soft-tissue]
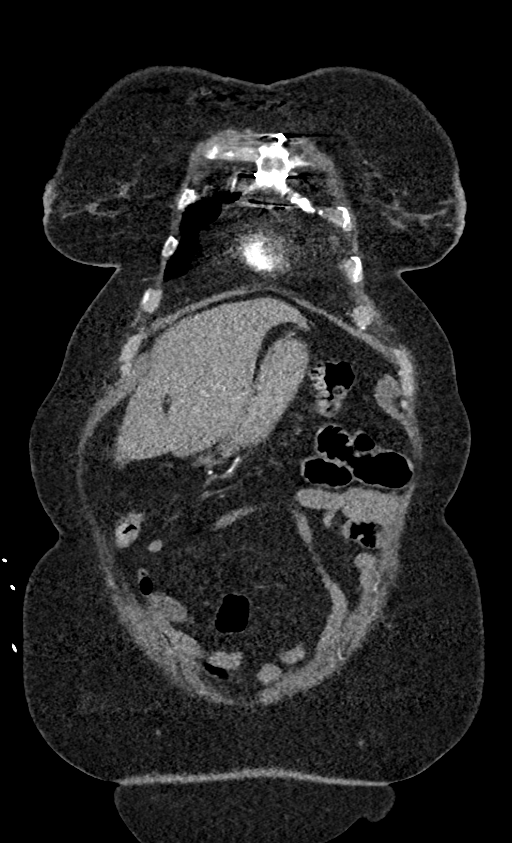
[im 73/146  soft-tissue]
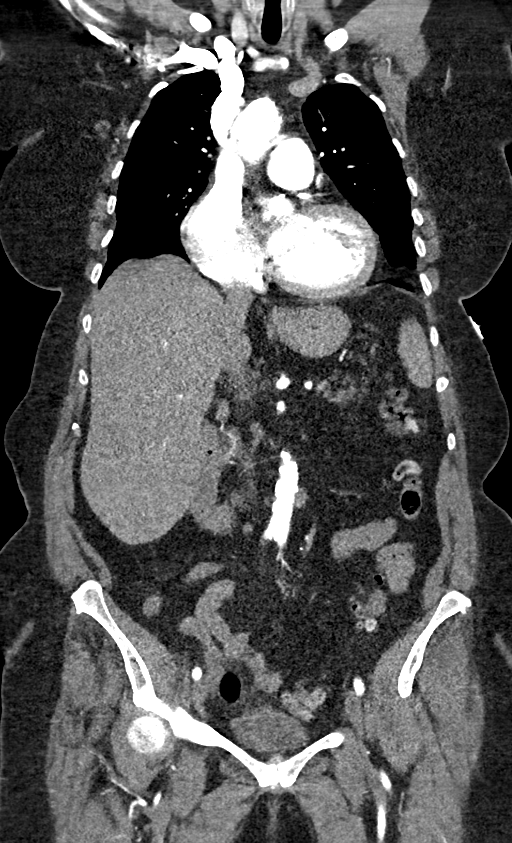
[im 109/146  soft-tissue]
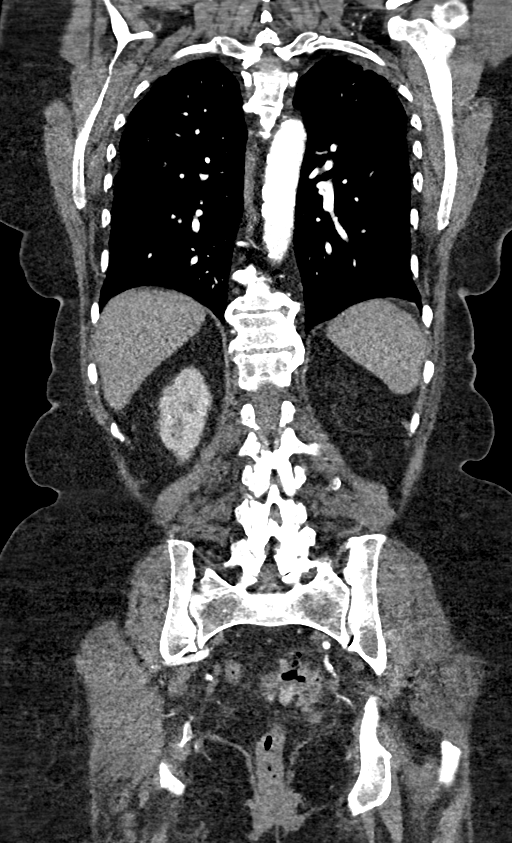

[13 of 46 positions shown; findings below may reference images not displayed]

FINDINGS: CTA CHEST FINDINGS

Cardiovascular: There is significant three-vessel coronary artery
disease. Heart size is normal. No pericardial effusion. There is
atherosclerotic calcification of the thoracic aorta not associated
with aneurysm or dissection.

Mediastinum/Nodes: No mediastinal, hilar, or axillary adenopathy.
The visualized portion of the thyroid gland has a normal appearance.
Esophagus is normal in appearance. Small hiatal hernia. Median
sternotomy.

Lungs/Pleura: Lungs are clear. No pleural effusion or pneumothorax.

Musculoskeletal: Mild degenerative changes are seen in the thoracic
spine. No suspicious lytic or blastic lesions are identified.

Review of the MIP images confirms the above findings.

CTA ABDOMEN AND PELVIS FINDINGS

VASCULAR

Aorta: There is atherosclerotic calcification of the aorta. No
aneurysm or dissection.

Celiac: Significant atherosclerotic calcification at the origin of
the celiac axis and significant narrowing 1.4 centimeters from the
origin. There is reconstituted flow distal to the stenosis.

SMA: Significant atherosclerotic calcification at the origin with
mild narrowing.

Renals: There is atherosclerotic calcification of the single renal
arteries bilaterally. No significant stenosis.

IMA: Patent without evidence of aneurysm, dissection, vasculitis or
significant stenosis.

Inflow: Unremarkable.

Veins: No obvious venous abnormality within the limitations of this
arterial phase study.

Review of the MIP images confirms the above findings.

NON-VASCULAR

Hepatobiliary: The gallbladder is distended and contains numerous
calcified stones. The liver is homogeneous

Pancreas: Unremarkable. No pancreatic ductal dilatation or
surrounding inflammatory changes.

Spleen: Normal in size without focal abnormality.

Adrenals/Urinary Tract: Adrenal glands are normal in appearance. No
renal mass or hydronephrosis. There is irregular thickening of the
urinary bladder wall. Small amount of air is identified within the
bladder, raising question of recent catheterization or infection.

Stomach/Bowel: Small hiatal hernia. Stomach is otherwise normal in
appearance. Small bowel loops are normal in appearance.

There is significant diverticular disease of the entire colon,
particularly involving the sigmoid segments. Surrounding the sigmoid
colon, there are inflammatory changes and punctate locules of gas
consistent with micro perforation. No abscess

Lymphatic: No retroperitoneal or mesenteric adenopathy.

Reproductive: Uterus is absent.  No adnexal mass.

Other: No free pelvic fluid. Anterior abdominal wall is
unremarkable.

Musculoskeletal: Degenerative changes are identified in LOWER
thoracic and lumbar spine.

Review of the MIP images confirms the above findings.
IMPRESSION: 1. Acute sigmoid diverticulitis with evidence for microperforation.
There is no associated abscess.
2. Significant atherosclerotic disease of the thoracic and abdominal
aorta. No aneurysm or dissection. Aortic atherosclerosis.
([id number])
3. Three-vessel coronary artery disease.
4. Small hiatal hernia.
5. Atherosclerosis of the celiac, SMA, renal arteries, and IMA. No
evidence for occlusion. There is tight stenosis of the proximal
celiac axis with distal reconstitution.
6. Cholelithiasis.
7. Irregularly thickened urinary bladder wall and air within the
urinary bladder, consistent with urinary tract infection. This may
be secondary process related to the sigmoid diverticulitis.
8. Hysterectomy.
